# Patient Record
Sex: Male | Born: 1940 | Race: White | Hispanic: No | Marital: Married | Smoking: Never smoker
Health system: Southern US, Community
[De-identification: ages and names within clinical notes are randomized; demographics above are authoritative.]

## PROBLEM LIST (undated history)

## (undated) DIAGNOSIS — K602 Anal fissure, unspecified: Secondary | ICD-10-CM

## (undated) DIAGNOSIS — E785 Hyperlipidemia, unspecified: Secondary | ICD-10-CM

## (undated) DIAGNOSIS — N390 Urinary tract infection, site not specified: Secondary | ICD-10-CM

## (undated) DIAGNOSIS — C61 Malignant neoplasm of prostate: Secondary | ICD-10-CM

## (undated) DIAGNOSIS — Z8719 Personal history of other diseases of the digestive system: Secondary | ICD-10-CM

## (undated) DIAGNOSIS — B9620 Unspecified Escherichia coli [E. coli] as the cause of diseases classified elsewhere: Secondary | ICD-10-CM

## (undated) DIAGNOSIS — N189 Chronic kidney disease, unspecified: Secondary | ICD-10-CM

## (undated) DIAGNOSIS — K5790 Diverticulosis of intestine, part unspecified, without perforation or abscess without bleeding: Secondary | ICD-10-CM

## (undated) HISTORY — DX: Hyperlipidemia, unspecified: E78.5

## (undated) HISTORY — DX: Urinary tract infection, site not specified: N39.0

## (undated) HISTORY — PX: TONSILLECTOMY AND ADENOIDECTOMY: SUR1326

## (undated) HISTORY — PX: CATARACT EXTRACTION, BILATERAL: SHX1313

## (undated) HISTORY — DX: Diverticulosis of intestine, part unspecified, without perforation or abscess without bleeding: K57.90

## (undated) HISTORY — PX: PROSTATE BIOPSY: SHX241

## (undated) HISTORY — PX: COLONOSCOPY: SHX174

## (undated) HISTORY — DX: Unspecified Escherichia coli (E. coli) as the cause of diseases classified elsewhere: B96.20

## (undated) HISTORY — PX: APPENDECTOMY: SHX54

## (undated) HISTORY — DX: Anal fissure, unspecified: K60.2

## (undated) HISTORY — PX: WISDOM TOOTH EXTRACTION: SHX21

---

## 1961-02-07 HISTORY — PX: APPENDECTOMY: SHX54

## 2003-04-25 ENCOUNTER — Encounter: Payer: Self-pay | Admitting: Internal Medicine

## 2005-10-18 ENCOUNTER — Ambulatory Visit: Payer: Self-pay | Admitting: Internal Medicine

## 2005-10-20 ENCOUNTER — Ambulatory Visit: Payer: Self-pay | Admitting: Internal Medicine

## 2005-11-17 ENCOUNTER — Ambulatory Visit: Payer: Self-pay | Admitting: Gastroenterology

## 2005-11-22 ENCOUNTER — Ambulatory Visit: Payer: Self-pay | Admitting: Gastroenterology

## 2007-07-12 ENCOUNTER — Encounter: Payer: Self-pay | Admitting: Internal Medicine

## 2008-04-08 ENCOUNTER — Telehealth (INDEPENDENT_AMBULATORY_CARE_PROVIDER_SITE_OTHER): Payer: Self-pay | Admitting: *Deleted

## 2008-04-10 ENCOUNTER — Ambulatory Visit: Payer: Self-pay | Admitting: Internal Medicine

## 2008-04-10 ENCOUNTER — Encounter (INDEPENDENT_AMBULATORY_CARE_PROVIDER_SITE_OTHER): Payer: Self-pay | Admitting: *Deleted

## 2008-04-10 LAB — CONVERTED CEMR LAB
Basophils Absolute: 0 10*3/uL (ref 0.0–0.1)
Basophils Relative: 0 % (ref 0.0–3.0)
Calcium: 9.2 mg/dL (ref 8.4–10.5)
Cholesterol: 220 mg/dL (ref 0–200)
Creatinine, Ser: 1.3 mg/dL (ref 0.4–1.5)
Direct LDL: 164.8 mg/dL
Eosinophils Absolute: 0.3 10*3/uL (ref 0.0–0.7)
GFR calc non Af Amer: 58 mL/min
HDL: 33.7 mg/dL — ABNORMAL LOW (ref >39.0)
MCHC: 34.4 g/dL (ref 30.0–36.0)
MCV: 90.1 fL (ref 78.0–100.0)
Neutro Abs: 3.3 10*3/uL (ref 1.4–7.7)
Neutrophils Relative %: 55.9 % (ref 43.0–77.0)
PSA: 2.66 ng/mL (ref 0.10–4.00)
RBC: 5.35 M/uL (ref 4.22–5.81)
RDW: 13 % (ref 11.5–14.6)
Sodium: 139 meq/L (ref 135–145)
TSH: 1.6 microintl units/mL (ref 0.35–5.50)
Total Bilirubin: 1.1 mg/dL (ref 0.3–1.2)

## 2008-04-15 ENCOUNTER — Ambulatory Visit: Payer: Self-pay | Admitting: Internal Medicine

## 2008-04-15 DIAGNOSIS — E785 Hyperlipidemia, unspecified: Secondary | ICD-10-CM | POA: Insufficient documentation

## 2008-04-15 DIAGNOSIS — M545 Low back pain, unspecified: Secondary | ICD-10-CM | POA: Insufficient documentation

## 2008-04-15 DIAGNOSIS — D239 Other benign neoplasm of skin, unspecified: Secondary | ICD-10-CM | POA: Insufficient documentation

## 2008-05-19 ENCOUNTER — Encounter: Payer: Self-pay | Admitting: Internal Medicine

## 2008-06-25 ENCOUNTER — Ambulatory Visit: Payer: Self-pay | Admitting: Internal Medicine

## 2008-07-04 LAB — CONVERTED CEMR LAB
ALT: 23 units/L (ref 0–53)
AST: 22 units/L (ref 0–37)
Albumin: 4 g/dL (ref 3.5–5.2)
Alkaline Phosphatase: 42 units/L (ref 39–117)
Cholesterol: 172 mg/dL (ref 0–200)
Total Protein: 6.7 g/dL (ref 6.0–8.3)
Triglycerides: 97 mg/dL (ref 0.0–149.0)

## 2008-07-09 ENCOUNTER — Ambulatory Visit: Payer: Self-pay | Admitting: Internal Medicine

## 2008-10-09 ENCOUNTER — Ambulatory Visit: Payer: Self-pay | Admitting: Internal Medicine

## 2008-10-17 LAB — CONVERTED CEMR LAB
Bilirubin, Direct: 0.1 mg/dL (ref 0.0–0.3)
Cholesterol: 163 mg/dL (ref 0–200)
LDL Cholesterol: 104 mg/dL — ABNORMAL HIGH (ref 0–99)
Total Bilirubin: 1.3 mg/dL — ABNORMAL HIGH (ref 0.3–1.2)
Total Protein: 6.7 g/dL (ref 6.0–8.3)
VLDL: 22.2 mg/dL (ref 0.0–40.0)

## 2008-10-21 ENCOUNTER — Encounter (INDEPENDENT_AMBULATORY_CARE_PROVIDER_SITE_OTHER): Payer: Self-pay | Admitting: *Deleted

## 2008-12-26 ENCOUNTER — Ambulatory Visit: Payer: Self-pay | Admitting: Internal Medicine

## 2009-05-05 ENCOUNTER — Ambulatory Visit: Payer: Self-pay | Admitting: Internal Medicine

## 2009-05-05 DIAGNOSIS — L255 Unspecified contact dermatitis due to plants, except food: Secondary | ICD-10-CM | POA: Insufficient documentation

## 2009-05-05 DIAGNOSIS — R9431 Abnormal electrocardiogram [ECG] [EKG]: Secondary | ICD-10-CM | POA: Insufficient documentation

## 2009-05-11 ENCOUNTER — Ambulatory Visit: Payer: Self-pay | Admitting: Internal Medicine

## 2009-05-11 ENCOUNTER — Telehealth (INDEPENDENT_AMBULATORY_CARE_PROVIDER_SITE_OTHER): Payer: Self-pay | Admitting: *Deleted

## 2009-05-12 ENCOUNTER — Ambulatory Visit: Payer: Self-pay

## 2009-05-12 ENCOUNTER — Encounter (HOSPITAL_COMMUNITY): Admission: RE | Admit: 2009-05-12 | Discharge: 2009-07-08 | Payer: Self-pay | Admitting: Internal Medicine

## 2009-05-12 ENCOUNTER — Ambulatory Visit: Payer: Self-pay | Admitting: Cardiology

## 2009-05-14 LAB — CONVERTED CEMR LAB
Alkaline Phosphatase: 38 units/L — ABNORMAL LOW (ref 39–117)
BUN: 21 mg/dL (ref 6–23)
Basophils Absolute: 0 10*3/uL (ref 0.0–0.1)
Basophils Relative: 0.5 % (ref 0.0–3.0)
Bilirubin, Direct: 0.2 mg/dL (ref 0.0–0.3)
Chloride: 104 meq/L (ref 96–112)
Cholesterol: 173 mg/dL (ref 0–200)
Eosinophils Absolute: 0.4 10*3/uL (ref 0.0–0.7)
LDL Cholesterol: 103 mg/dL — ABNORMAL HIGH (ref 0–99)
Lymphocytes Relative: 25.3 % (ref 12.0–46.0)
MCHC: 34.6 g/dL (ref 30.0–36.0)
Neutrophils Relative %: 57.4 % (ref 43.0–77.0)
Potassium: 4.2 meq/L (ref 3.5–5.1)
RBC: 5.17 M/uL (ref 4.22–5.81)
RDW: 13.5 % (ref 11.5–14.6)
Total Bilirubin: 0.8 mg/dL (ref 0.3–1.2)
VLDL: 23.6 mg/dL (ref 0.0–40.0)

## 2010-03-09 NOTE — Assessment & Plan Note (Signed)
Summary: CPX/KDC   Vital Signs:  Patient profile:   70 year old male Height:      69.5 inches Weight:      180 pounds BMI:     26.29 Temp:     98.4 degrees F oral Resp:     14 per minute BP sitting:   100 / 60  (left arm) Cuff size:   large  Vitals Entered By: Miguel Joseph (May 05, 2009 1:11 PM)  Comments REVIEWED MED LIST, PATIENT AGREED DOSE AND INSTRUCTION CORRECT    History of Present Illness: Miguel Joseph is here for a physical; he is asymptomatic except for poison ivy  picked up yard cleaning & chronic LBP. Preventive healthcare measures up to date except for Shingles Vaccine.  Preventive Screening-Counseling & Management  Caffeine-Diet-Exercise     Does Patient Exercise: yes  Allergies (verified): No Known Drug Allergies  Past History:  Past Medical History: Hyperlipidemia: NMR : LDL 209(2406/1212), HDL 30, TG 154. LDL goal = < 120. LBP   Past Surgical History: Colonoscopy 2007: Internal Hemorrhoids, Dr  Claudette Head (due 2017) Appendectomy Tonsillectomy  Family History: Father: Testicular cancer Mother: breast & ovarian cancer Siblings: sister breast cancer; 2 P uncles pancreatic cancer ; P aunt pacer; P uncle MI pre 35 (smoker)  Social History: Never Smoked Alcohol use-yes:socially Retired Married Regular exercise-yes Does Patient Exercise:  yes  Review of Systems  The patient denies anorexia, fever, weight loss, weight gain, vision loss, decreased hearing, hoarseness, chest pain, syncope, dyspnea on exertion, peripheral edema, prolonged cough, headaches, hemoptysis, abdominal pain, melena, hematochezia, severe indigestion/heartburn, hematuria, incontinence, suspicious skin lesions, depression, unusual weight change, abnormal bleeding, enlarged lymph nodes, and angioedema.   MS:  Complains of low back pain; denies joint pain, joint redness, joint swelling, mid back pain, and thoracic pain. Neuro:  Denies brief paralysis, tingling, and weakness;  Occasional numbness in toes.  Physical Exam  General:  well-nourished; alert,appropriate and cooperative throughout examination Head:  Normocephalic and atraumatic without obvious abnormalities.  Eyes:  No corneal or conjunctival inflammation noted. Perrla. Funduscopic exam benign, without hemorrhages, exudates or papilledema.  Ears:  External ear exam shows no significant lesions or deformities.  Otoscopic examination reveals clear canals, tympanic membranes are intact bilaterally without bulging, retraction, inflammation or discharge. Hearing is grossly normal bilaterally. Nose:  External nasal examination shows no deformity or inflammation. Nasal mucosa are pink and moist without lesions or exudates. Mouth:  Oral mucosa and oropharynx without lesions or exudates.  Teeth in good repair. Neck:  No deformities, masses, or tenderness noted. Lungs:  Normal respiratory effort, chest expands symmetrically. Lungs are clear to auscultation, no crackles or wheezes. Heart:  Normal rate and regular rhythm. S1 and S2 normal without gallop, murmur, click, rub . S4 Abdomen:  Bowel sounds positive,abdomen soft and non-tender without masses, organomegaly or hernias noted. Rectal:  No external abnormalities noted. Normal sphincter tone. No rectal masses or tenderness. Genitalia:  Testes bilaterally descended without nodularity, tenderness or masses L testicle slightly atrophic. No scrotal masses or lesions. No penis lesions or urethral discharge. Prostate:  Prostate gland firm and smooth, no enlargement, nodularity, tenderness, mass, asymmetry or induration. Msk:  No deformity or scoliosis noted of thoracic or lumbar spine.   Pulses:  R and L carotid,radial,dorsalis pedis and posterior tibial pulses are full and equal bilaterally Extremities:  No clubbing, cyanosis, edema; normal full range of motion of all joints.   Minor DIP DJD finger changes Neurologic:  alert & oriented X3  and DTRs symmetrical and normal.    Skin:  Contact dermatitis over forearms Cervical Nodes:  No lymphadenopathy noted Axillary Nodes:  No palpable lymphadenopathy Inguinal Nodes:  No significant adenopathy Psych:  memory intact for recent and remote, normally interactive, and good eye contact.     Impression & Recommendations:  Problem # 1:  ROUTINE GENERAL MEDICAL EXAM@[email or web address]  CARE FACL (ICD-V70.0)  Orders: EKG w/ Interpretation (93000)  Problem # 2:  HYPERLIPIDEMIA (ICD-272.4)  Orders: Cardiolite (Cardiolite)  Problem # 3:  LOW BACK PAIN, MILD (ICD-724.2)  His updated medication list for this problem includes:    Bayer Aspirin 325 Mg Tabs (Aspirin) .Marland Kitchen... 1 by mouth once daily  Problem # 4:  NONSPECIFIC ABNORMAL ELECTROCARDIOGRAM (ICD-794.31)  Orders: Cardiolite (Cardiolite)  Problem # 5:  RHUS DERMATITIS (ICD-692.6) resolving with present meds  Complete Medication List: 1)  Bayer Aspirin 325 Mg Tabs (Aspirin) .Marland Kitchen.. 1 by mouth once daily 2)  Antioxidant Tabs (Multiple vitamins-minerals) .Marland Kitchen.. 1 by mouth once daily 3)  Centrum Silver Tabs (Multiple vitamins-minerals) .Marland Kitchen.. 1 by mouth once daily 4)  Pravastatin Sodium 40 Mg Tabs (pravastatin Sodium)  .Marland Kitchen.. 1 at bedtime  Patient Instructions: 1)  Schedule fasting labs @ [address]: 2)  BMP ; 3)  Hepatic Panel ; 4)  Lipid Panel ; 5)  TSH; 6)  CBC w/ Diff ; 7)  PSA. (Codes: V70.0, 272.4,794.31)

## 2010-03-09 NOTE — Assessment & Plan Note (Signed)
Summary: Cardiology Nuclear Study  Nuclear Med Background Indications for Stress Test: Evaluation for Ischemia, Abnormal EKG   History: Abnormal EKG      Nuclear Pre-Procedure Cardiac Risk Factors: Lipids Caffeine/Decaff Intake: none NPO After: 7:00 PM Lungs: clear IV 0.9% NS with Angio Cath: 20g     IV Site: (R) AC IV Started by: Stanton Kidney EMT-P Chest Size (in) 42     Height (in): 69.5 Weight (lb): 178 BMI: 26.00  Nuclear Med Study 1 or 2 day study:  1 day     Stress Test Type:  Stress Reading MD:  Olga Millers, MD     Referring MD:  W.Hopper Resting Radionuclide:  Technetium 40m Tetrofosmin     Resting Radionuclide Dose:  11.0 mCi  Stress Radionuclide:  Technetium 20m Tetrofosmin     Stress Radionuclide Dose:  33.0 mCi   Stress Protocol Exercise Time (min):  9:00 min     Max HR:  131 bpm     Predicted Max HR:  151 bpm  Max Systolic BP: 162 mm Hg     Percent Max HR:  86.75 %     METS: 10.10 Rate Pressure Product:  64403    Stress Test Technologist:  Milana Na EMT-P     Nuclear Technologist:  Domenic Polite CNMT  Rest Procedure  Myocardial perfusion imaging was performed at rest 45 minutes following the intravenous administration of Myoview Technetium 46m Tetrofosmin.  Stress Procedure  The patient exercised for 9:00. The patient stopped due to fatigue and denied any chest pain.  There were + T wave changes.  Myoview was injected at peak exercise and myocardial perfusion imaging was performed after a brief delay.  QPS Raw Data Images:  Acuisition technically good; normal left ventricular size. Stress Images:  There is normal uptake in all areas. Rest Images:  Normal homogeneous uptake in all areas of the myocardium. Subtraction (SDS):  No evidence of ischemia. Transient Ischemic Dilatation:  1.1  (Normal <1.22)  Lung/Heart Ratio:  .12  (Normal <0.45)  Quantitative Gated Spect Images QGS EDV:  95 ml QGS ESV:  43 ml QGS EF:  55 % QGS cine images:   Normal wall motion.   Overall Impression  Exercise Capacity: Good exercise capacity. BP Response: Normal blood pressure response. Clinical Symptoms: No chest pain ECG Impression: No significant ST segment change suggestive of ischemia. Overall Impression: There is no sign of scar or ischemia.  Appended Document: Cardiology Nuclear Study This is an excellent report. Despite the non specific EKG changes ,there is no evidence of ischemia (compromised coronary blood flow), rhythm irregularity or heart chamber dysfunction. Regular exercise recommended 30-45 min 3-4  X/week. [employer]

## 2010-03-09 NOTE — Progress Notes (Signed)
Summary: Nuclear Pre-Procedure  Phone Note Outgoing Call   Call placed by: Milana Na, EMT-P,  May 11, 2009 3:05 PM Summary of Call: Left message with information on Myoview Information Sheet (see scanned document for details).      Nuclear Med Background Indications for Stress Test: Evaluation for Ischemia, Abnormal EKG        Nuclear Pre-Procedure Cardiac Risk Factors: Lipids Height (in): 69.5  Nuclear Med Study Referring MD:  W.Hopper

## 2010-06-25 NOTE — Assessment & Plan Note (Signed)
[facility]                           GASTROENTEROLOGY OFFICE NOTE   Miguel Joseph, Miguel Joseph                      MRN:          [id number]  DATE:11/17/2005                            DOB:          01/06/1941    REFERRING PHYSICIAN:  Titus Joseph. Hopper, MD,FACP,FCCP   REASON FOR REFERRAL:  Rectal bleed and change in bowel habits.   HISTORY OF PRESENT ILLNESS:  Mr. Miguel Joseph is a very nice 70 year old white  male who I have seen in the past.  He underwent colonoscopy in March, 2001  for small volume hematochezia.  Internal hemorrhoids were noted and were  injected with hypertonic saline.  The remainder of the examination was  unremarkable.  He had a flexible sigmoidoscopy performed in 1997 for  colorectal cancer screening that was normal.  He relates problems over the  past few months with rectal pain, rectal irritation, tape-like stools, and  occasional small amounts of bleeding.  He feels there is a slight blockage  or narrowing of his colon that makes it difficult to evacuate stools.  He  has noted slightly looser stools on occasion and rectal burning with bowel  movements as well.  He is prescribed Proctofoam by Dr. Alwyn Joseph, which did  help improve his symptoms.  He relates no abdominal pain, weight loss,  nausea, vomiting, or melena.  There is no family history of colon cancer,  colon polyps, or inflammatory bowel disease, although his mother had breast  and uterine cancer, his sister had breast cancer.  His father has had  testicular cancer and apparently two paternal uncles have had pancreatic  cancer.   PAST MEDICAL HISTORY:  1. Hyperlipidemia.  2. Status post tonsillectomy.  3. Status post appendectomy.  4. Internal hemorrhoids.   CURRENT MEDICATIONS:  Listed on the chart, updated and reviewed.   MEDICATION ALLERGIES:  None known.   SOCIAL HISTORY:  Per the handwritten form.   REVIEW OF SYSTEMS:  Per the handwritten form.   PHYSICAL  EXAMINATION:  GENERAL:  No acute distress.  VITAL SIGNS:  Height 5 feet 9 inches.  Weight 182 pounds.  Blood pressure  110/76, pulse 80 and regular.  HEENT:  Anicteric sclerae.  Oropharynx clear.  CHEST:  Clear to auscultation bilaterally.  CARDIAC:  Regular rate and rhythm without murmur appreciated.  ABDOMEN:  Soft, nontender, nondistended.  Normoactive bowel sounds.  No  palpable organomegaly, masses, or hernias.  RECTAL:  Deferred to time of colonoscopy.  Recent exam by Dr. Alwyn Joseph showed  no lesions and hemoccult negative stool.  EXTREMITIES:  Without clubbing, cyanosis or edema.  NEUROLOGIC:  Alert and oriented x3.  Grossly nonfocal.   ASSESSMENT/PLAN:  Small volume hematochezia, rectal pain and burning, and a  change in stool caliber.  Rule out colorectal neoplasms.  I suspect this is  recurrent internal hemorrhoids.  Fissures, neoplasms, and proctitis need to  be further excluded.  Risks, benefits and alternatives to colonoscopy,  possible biopsy, possible polypectomy, and possible obstruction of internal  hemorrhoids were discussed with the patient, and he consents to proceed.  This will be scheduled electively.  In the interim, he is to begin a high-  fiber diet with daily fiber supplements.  Standard instructions for rectal  care and hemorrhoids supplied to the patient.  He may use an over-the-  counter suppository such as Anusol or Preparation-H nightly until his  colonoscopy has been completed.       Venita Lick. Miguel Dar, MD, [facility]      MTS/MedQ  DD:  11/17/2005  DT:  11/18/2005  Job #:  [id number]   cc:   Miguel Joseph. Miguel Ren, MD,FACP,FCCP

## 2010-06-25 NOTE — Assessment & Plan Note (Signed)
[facility]                          [place] OFFICE NOTE   [name], [name]                      MRN:          [id number]  DATE:10/18/2005                            DOB:          1940/04/12    Complaining of pain with bowel movements and loose stools.  He denies any  rectal bleeding.  The pain is described as a burning or tearing sensation  and as a bulging sensation near his rectum.   The symptoms began in July as a slight pain with bowel movements and  initially a tiny bit of blood.  He does have a history of fissure-in-ano.  He states he has had some pain with every bowel movement for 1 month.  On  September 10 he had multiple small stools, approximately 8:00 a.m. and  residual pain until 3:00 p.m.  Since that event, stools have been semi-  loose.   He had a colonoscopy in 2001, which was negative.   PAST MEDICAL HISTORY:  Includes tonsillectomy, appendectomy, and  dyslipidemia.   Mother had breast and ovarian cancer.  Father had testicular cancer.  Sister  had breast cancer.  Paternal uncle had pancreatic cancer.  Paternal aunt had  a pacer.  He has never smoked and he drinks socially.   He was previously on Zocor, but felt that this caused sleep dysfunction.  He  has not had followup because he admits focusing on his work rather than on  preventative health care.   He is in no acute distress.  His weight was actually up over 2 pounds to 185.9.  Blood pressure was  130/80, pulse 17, respiratory rate of 16.  An S4 was noted.  All pulses were intact.  PULMONARY:  Unremarkable.  He had no abdominal tumors and no organomegaly.  He had no lymphadenopathy.  Hemoccult testing was negative.  Prostate was normal to palpation.   His CBC and differential were normal.  Total cholesterol was 236 with  triglycerides of 206, HDL of 31 and LDL of 166.  I mailed copies of the labs  to him and requested that he see me to discuss these  risks and his options.  His glomerular filtration rate was 54 suggesting mild impairment; 40 ounces  of water daily would be recommended.  PSA and TSH were normal.  Urinalysis  was also normal.   GI consultation will be pursued as a prelude to repeat colonoscopy.                                   Titus Dubin. [name], MD,FACP,FCCP   [facility]/MedQ  DD:  10/30/2005  DT:  11/01/2005  Job #:  [id number]

## 2010-08-09 ENCOUNTER — Encounter: Payer: Self-pay | Admitting: Internal Medicine

## 2010-08-17 ENCOUNTER — Other Ambulatory Visit: Payer: Self-pay | Admitting: Internal Medicine

## 2010-08-18 NOTE — Telephone Encounter (Signed)
Lipid/Hep 272.4/995.20  

## 2010-09-15 ENCOUNTER — Other Ambulatory Visit: Payer: Self-pay | Admitting: Internal Medicine

## 2010-10-15 ENCOUNTER — Encounter: Payer: Self-pay | Admitting: Internal Medicine

## 2010-10-15 ENCOUNTER — Ambulatory Visit (INDEPENDENT_AMBULATORY_CARE_PROVIDER_SITE_OTHER): Payer: Medicare Other | Admitting: Internal Medicine

## 2010-10-15 VITALS — BP 112/70 | HR 73 | Temp 98.4°F | Resp 14 | Wt 186.0 lb

## 2010-10-15 DIAGNOSIS — E785 Hyperlipidemia, unspecified: Secondary | ICD-10-CM

## 2010-10-15 DIAGNOSIS — M5417 Radiculopathy, lumbosacral region: Secondary | ICD-10-CM

## 2010-10-15 DIAGNOSIS — Z0000 Encounter for general adult medical examination without abnormal findings: Secondary | ICD-10-CM

## 2010-10-15 DIAGNOSIS — IMO0002 Reserved for concepts with insufficient information to code with codable children: Secondary | ICD-10-CM

## 2010-10-15 DIAGNOSIS — R9431 Abnormal electrocardiogram [ECG] [EKG]: Secondary | ICD-10-CM

## 2010-10-15 MED ORDER — PRAVASTATIN SODIUM 40 MG PO TABS: 40.0000 mg | ORAL_TABLET | Freq: Every day | ORAL | Status: DC

## 2010-10-15 MED ORDER — GABAPENTIN 100 MG PO CAPS: 100.0000 mg | ORAL_CAPSULE | ORAL | Status: DC

## 2010-10-15 NOTE — Progress Notes (Signed)
Subjective:    Patient ID: Miguel Joseph, male    DOB: 03-20-1940, 70 y.o.   MRN: [id number]  HPI Medicare Wellness Visit:  The following psychosocial & medical history were reviewed as required by Medicare.   Social history: caffeine: 1 cola / day , alcohol:  Wine 2 glasses / week ,  tobacco use : never  & exercise : walking 3.5 mpd daily.   Home & personal  safety / fall risk: no issues, activities of daily living: nolimitations , seatbelt use : yes , and smoke alarm employment : yes .  Power of Attorney/Living Will status : yes  Vision ( as recorded per Nurse) & Hearing  evaluation :  Last Ophth exam several months ago ; minimal vision  OS(chronic); decreased  Hearing to whisper @ 6 ft. Orientation :orientation X 3 , memory & recall :normal, spelling  testing: good,and mood & affect : normal . Depression / anxiety: denied Travel history : 2012 [place] , immunization status :Shingles & Pneumovax needed , transfusion history:  no, and preventive health surveillance ( colonoscopies, BMD , etc as per protocol/ [facility]): colonoscopy up to date, Dental care:  Seen every  . Chart reviewed &  Updated. Active issues reviewed & addressed.       Review of Systems he has had some chronic low back issues; remotely he saw Dr. Darrelyn Hillock, orthopedist.  Intermittently he is noted sharp pain in a sciatic radiation down the right lower extremity. He also has numbness in both great toes.    Objective:   Physical Exam Gen.: Healthy and well-nourished in appearance. Alert, appropriate and cooperative throughout exam. Head: Normocephalic without obvious abnormalities Eyes: No corneal or conjunctival inflammation noted. Pupils equal round reactive to light and accommodation. Fundal exam is benign without hemorrhages, exudate, papilledema. Extraocular motion intact. Vision severely decreased OS. Ears: External  ear exam reveals no significant lesions or deformities. Canals clear .TMs normal. Hearing is grossly  decreased  bilaterally. Nose: External nasal exam reveals no deformity or inflammation. Nasal mucosa are pink and moist. No lesions or exudates noted. Septum  :slight dislocation  Mouth: Oral mucosa and oropharynx reveal no lesions or exudates. Teeth in good repair. Neck: No deformities, masses, or tenderness noted. Range of motion &. Thyroid normal. Lungs: Normal respiratory effort; chest expands symmetrically. Lungs are clear to auscultation without rales, wheezes, or increased work of breathing. Heart: Normal rate and rhythm. Normal S1 and S2. No gallop, click, or rub. No  murmur. Abdomen: Bowel sounds normal; abdomen soft and nontender. No masses, organomegaly .Slight ventral  hernia noted. Genitalia/DRE:  There is slight asymmetry of the testicles; left is smaller than the right. Granuloma is noted in the left supratesticular  area. Prostate is normal without enlargement, induration, or nodularity.                                                                                 Musculoskeletal/extremities: Asymmetry  noted of  The lower  Thoracic/ lumbar spine.  R paraspinus musculature > L.No clubbing, cyanosis, edema, or  Significant deformity noted. Range of motion  : decreased SLR bilaterally .Tone & strength  normal.Joints : minor DJD in fingers. Nail  health  good. Vascular: Carotid, radial artery, dorsalis pedis and  posterior tibial pulses are full and equal. No bruits present. Neurologic: Alert and oriented x3. Deep tendon reflexes symmetrical and normal.          A Skin: Intact without suspicious lesions or rashes. Lymph: No cervical, axillary, or inguinal lymphadenopathy present. Psych: Mood and affect are normal. Normally interactive                                                                                        Assessment & Plan:  #1 Medicare Wellness Exam; criteria met ; data entered #2 Problem List reviewed ; Assessment/ Recommendations made  #3 lumbosacral  radiculopathy right lower extremity, intermittent. The asymmetry of the lower spine musculature suggests possible scoliosis. Plan: see Orders  Stretching exercises for the back would be recommended with gabapentin as needed for a sciatic pain. If symptoms persist or progress; imaging (MRI) would be indicated.    EKG is essentially normal. 2 PACs are noted. He has an incomplete right bundle branch block pattern. The previously noted minor nonspecific T wave changes have improved compared to 05/05/09.

## 2010-10-15 NOTE — Patient Instructions (Signed)
Preventive Health Care: Exercise at least 30-45 minutes a day,  3-4 days a week.  Eat a low-fat diet with lots of fruits and vegetables, up to 7-9 servings per day. Avoid obesity; your goal is waist measurement < 40 inches.Consume less than 40 grams of sugar per day from foods & drinks with High Fructose Corn Sugar as # 1,2,3 or # 4 on label. The best exercises for the low back include freestyle swimming, stretch aerobics, and yoga.  Please  schedule fasting Labs : BMET,Lipids, hepatic panel, CBC & dif, TSH (272.4, 995.20)

## 2010-10-18 ENCOUNTER — Other Ambulatory Visit: Payer: Self-pay | Admitting: Internal Medicine

## 2010-10-18 DIAGNOSIS — E785 Hyperlipidemia, unspecified: Secondary | ICD-10-CM

## 2010-10-18 DIAGNOSIS — T887XXA Unspecified adverse effect of drug or medicament, initial encounter: Secondary | ICD-10-CM

## 2010-10-19 ENCOUNTER — Other Ambulatory Visit (INDEPENDENT_AMBULATORY_CARE_PROVIDER_SITE_OTHER): Payer: Medicare Other

## 2010-10-19 DIAGNOSIS — E785 Hyperlipidemia, unspecified: Secondary | ICD-10-CM

## 2010-10-19 DIAGNOSIS — T887XXA Unspecified adverse effect of drug or medicament, initial encounter: Secondary | ICD-10-CM

## 2010-10-19 LAB — TSH: TSH: 1.52 u[IU]/mL (ref 0.35–5.50)

## 2010-10-19 LAB — CBC WITH DIFFERENTIAL/PLATELET
Basophils Absolute: 0 10*3/uL (ref 0.0–0.1)
Hemoglobin: 16.5 g/dL (ref 13.0–17.0)
Lymphocytes Relative: 26.5 % (ref 12.0–46.0)
Monocytes Relative: 11 % (ref 3.0–12.0)
Platelets: 204 10*3/uL (ref 150.0–400.0)
RDW: 14 % (ref 11.5–14.6)
WBC: 5.7 10*3/uL (ref 4.5–10.5)

## 2010-10-19 LAB — BASIC METABOLIC PANEL
BUN: 26 mg/dL — ABNORMAL HIGH (ref 6–23)
CO2: 28 mEq/L (ref 19–32)
Calcium: 9.1 mg/dL (ref 8.4–10.5)
GFR: 54.46 mL/min — ABNORMAL LOW (ref >60.00)
Glucose, Bld: 96 mg/dL (ref 70–99)
Potassium: 4.4 mEq/L (ref 3.5–5.1)

## 2010-10-19 LAB — HEPATIC FUNCTION PANEL
AST: 27 U/L (ref 0–37)
Albumin: 4.3 g/dL (ref 3.5–5.2)
Alkaline Phosphatase: 33 U/L — ABNORMAL LOW (ref 39–117)
Total Protein: 7 g/dL (ref 6.0–8.3)

## 2010-10-19 LAB — LIPID PANEL
Total CHOL/HDL Ratio: 4
VLDL: 27.6 mg/dL (ref 0.0–40.0)

## 2010-10-19 NOTE — Progress Notes (Signed)
Labs only

## 2010-10-22 NOTE — Progress Notes (Signed)
Labs only

## 2011-05-30 ENCOUNTER — Encounter: Payer: Self-pay | Admitting: Family

## 2011-05-30 ENCOUNTER — Ambulatory Visit (INDEPENDENT_AMBULATORY_CARE_PROVIDER_SITE_OTHER): Payer: Medicare Other | Admitting: Family

## 2011-05-30 ENCOUNTER — Telehealth: Payer: Self-pay | Admitting: *Deleted

## 2011-05-30 VITALS — BP 108/68 | HR 75 | Temp 97.8°F | Resp 16 | Wt 181.0 lb

## 2011-05-30 DIAGNOSIS — H6690 Otitis media, unspecified, unspecified ear: Secondary | ICD-10-CM

## 2011-05-30 DIAGNOSIS — H6691 Otitis media, unspecified, right ear: Secondary | ICD-10-CM | POA: Insufficient documentation

## 2011-05-30 MED ORDER — CIPROFLOXACIN-HYDROCORTISONE 0.2-1 % OT SUSP: OTIC | Status: DC

## 2011-05-30 MED ORDER — AMOXICILLIN-POT CLAVULANATE 875-125 MG PO TABS: 1.0000 | ORAL_TABLET | Freq: Two times a day (BID) | ORAL | Status: AC

## 2011-05-30 NOTE — Telephone Encounter (Signed)
Received Rx error for cipro otic drops. Rx called to pharmacist per previous documentation.

## 2011-05-30 NOTE — Patient Instructions (Signed)
Call if symptoms worsen, or if no improvement in 2-3 days.  

## 2011-05-30 NOTE — Progress Notes (Signed)
  Subjective:    Patient ID: Miguel Joseph, male    DOB: 1940-04-05, 71 y.o.   MRN: [id number]  HPI  Mr. Brill is a 71 yr old male who presents today with chief complaint of right ear fullness.  Symptoms started about 1 week ago.  Describes pressure- feels like I am in a swimming pool.  He reports that the ear "won't pop." Tried an ear wax removal kit without improvement.  He denies associated fever, nasal congestion.    Review of Systems See HPI  No past medical history on file.  History   Social History  . Marital Status: Single    Spouse Name: N/A    Number of Children: N/A  . Years of Education: N/A   Occupational History  . Not on file.   Social History Main Topics  . Smoking status: Never Smoker   . Smokeless tobacco: Not on file  . Alcohol Use: 1.2 oz/week    2 Glasses of wine per week  . Drug Use: No  . Sexually Active: Not on file   Other Topics Concern  . Not on file   Social History Narrative  . No narrative on file    Past Surgical History  Procedure Date  . Colonoscopy 2001 & 2007    internal hemorrhoids; Dr Russella Dar  . Tonsillectomy and adenoidectomy   . Appendectomy     Family History  Problem Relation Age of Onset  . Cancer Mother     breast & ovarian   . Cancer Father     testicular  . Cancer Sister     breast  . Cancer Paternal Uncle      X2 ,pancreatic  . Heart block Paternal Aunt     pacer    No Known Allergies  Current Outpatient Prescriptions on File Prior to Visit  Medication Sig Dispense Refill  . pravastatin (PRAVACHOL) 40 MG tablet Take 1 tablet (40 mg total) by mouth daily.  90 tablet  3  . gabapentin (NEURONTIN) 100 MG capsule Take 1 capsule (100 mg total) by mouth as directed. 1 every 8 hrs prn for sciatica  30 capsule  5    BP 108/68  Pulse 75  Temp(Src) 97.8 F (36.6 C) (Oral)  Resp 16  Wt 181 lb (82.101 kg)  SpO2 95%       Objective:   Physical Exam  Constitutional: He appears well-developed and  well-nourished. No distress.  HENT:  Head: Normocephalic and atraumatic.  Mouth/Throat: No posterior oropharyngeal edema or posterior oropharyngeal erythema.       R TM initially obscured by cerumen (cerumen successfully removed with irrigation).  Revealing erythema of TM at 1 oclock, erythema and swelling of right canal.    Cardiovascular: Normal rate and regular rhythm.   No murmur heard. Pulmonary/Chest: Effort normal and breath sounds normal. No respiratory distress. He has no wheezes. He has no rales. He exhibits no tenderness.          Assessment & Plan:

## 2011-05-30 NOTE — Assessment & Plan Note (Signed)
Pt with R otitis media and R otitis externa.  Will rx with Augmentin and cipro hc otic drops.

## 2011-10-20 ENCOUNTER — Other Ambulatory Visit: Payer: Self-pay | Admitting: Internal Medicine

## 2011-10-20 ENCOUNTER — Other Ambulatory Visit: Payer: Self-pay | Admitting: General Practice

## 2011-10-20 DIAGNOSIS — E785 Hyperlipidemia, unspecified: Secondary | ICD-10-CM

## 2011-10-20 MED ORDER — PRAVASTATIN SODIUM 40 MG PO TABS: 40.0000 mg | ORAL_TABLET | Freq: Every day | ORAL | Status: DC

## 2011-10-31 ENCOUNTER — Encounter: Payer: Self-pay | Admitting: Internal Medicine

## 2011-10-31 ENCOUNTER — Ambulatory Visit (INDEPENDENT_AMBULATORY_CARE_PROVIDER_SITE_OTHER): Payer: Medicare Other | Admitting: Internal Medicine

## 2011-10-31 VITALS — BP 108/72 | HR 68 | Temp 97.9°F | Resp 12 | Ht 69.0 in | Wt 181.6 lb

## 2011-10-31 DIAGNOSIS — E785 Hyperlipidemia, unspecified: Secondary | ICD-10-CM

## 2011-10-31 DIAGNOSIS — Z23 Encounter for immunization: Secondary | ICD-10-CM

## 2011-10-31 DIAGNOSIS — Z0000 Encounter for general adult medical examination without abnormal findings: Secondary | ICD-10-CM

## 2011-10-31 NOTE — Patient Instructions (Addendum)
Preventive Health Care: Exercise at least 30-45 minutes a day,  3-4 days a week.  Eat a low-fat diet with lots of fruits and vegetables, up to 7-9 servings per day. Consume less than 40 grams of sugar per day from foods & drinks with High Fructose Corn Sugar as # 1,2,3 or # 4 on label. The best exercises for the low back include freestyle swimming, stretch aerobics, and yoga.  Please  schedule fasting Labs : BMET,Lipids, hepatic panel,  TSH.PLEASE BRING THESE INSTRUCTIONS TO FOLLOW UP  LAB APPOINTMENT.This will guarantee correct labs are drawn, eliminating need for repeat blood sampling ( needle sticks ! ). Diagnoses /Codes: 272.4, 995.20 .   If you activate My Chart; the results can be released to you as soon as they populate from the lab. If you choose not to use this program; the labs have to be reviewed, copied & mailed causing a delay in getting the results to you.

## 2011-10-31 NOTE — Progress Notes (Signed)
Subjective:    Patient ID: Miguel Joseph, male    DOB: [birth year], 71 y.o.   MRN: [id number]  HPI Medicare Wellness Visit:  The following psychosocial & medical history were reviewed as required by Medicare.   Social history: caffeine:2 glasses of  Tea/ day  , alcohol:  < 3 ,  tobacco use : never  & exercise : yard work, elliptical 3 X/ week X 30 min  & walking.   Home & personal  safety / fall risk: no issues, activities of daily living: no limitations , seatbelt use : yes , and smoke alarm employment : yes .  Power of Attorney/Living Will status : in place  Vision ( as recorded per Nurse) & Hearing  evaluation :  Ophth exam 5/13; no hearing exam. Orientation :oriented X 3  , memory & recall :good,  math testing: good,and mood & affect : normal . Depression / anxiety:denied Travel history : [place] 4/ 2013 , immunization status : ? Shingles & pneu vaccines needed , transfusion history:  no, and preventive health surveillance ( colonoscopies, BMD , etc as per protocol/ SOC): up to date, Dental care:  Every 4 mos . Chart reviewed &  Updated. Active issues reviewed & addressed.  Dyslipidemia assessment: Prior Advanced Lipid Testing: NMR LDL goal = < 115.   Family history of premature CAD/ MI: no .  Nutrition: heart healthy .  Diabetes : no . HTN: no.       Review of Systems  Weight :  stable.  fatigue: no ; chest pain : no ;claudication: no; palpitations: no; abd pain/bowel changes: no ; myalgias:no;  syncope : no ; memory loss: no;skin changes: no.      Objective:   Physical Exam Gen.: Appears younger than stated age .Healthy and well-nourished in appearance. Alert, appropriate and cooperative throughout exam. Head: Normocephalic without obvious abnormalities Eyes: No corneal or conjunctival inflammation noted. Pupils equal round reactive to light and accommodation. Fundal exam is benign without hemorrhages, exudate, papilledema. Extraocular motion intact. Vision grossly decreased in  OS even with lens. Ears: External  ear exam reveals no significant lesions or deformities. Canals clear .TMs normal. Hearing is grossly decreased bilaterally. Nose: External nasal exam reveals no deformity or inflammation. Nasal mucosa are pink and moist. No lesions or exudates noted.  Mouth: Oral mucosa and oropharynx reveal no lesions or exudates. Teeth in good repair. Neck: No deformities, masses, or tenderness noted. Range of motion slightly decreased. Thyroid normal. Lungs: Normal respiratory effort; chest expands symmetrically. Lungs are clear to auscultation without rales, wheezes, or increased work of breathing. Heart: Normal rate and rhythm. Normal S1 and S2. No gallop, click, or rub. S4 w/o murmur. Abdomen: Bowel sounds normal; abdomen soft and nontender. No masses, organomegaly or hernias noted. Genitalia/ BJY:[id number] normal except for atrophic  left testicle.  Prostate is normal without enlargement, asymmetry, nodularity, or induration.  Musculoskeletal/extremities: There is some asymmetry of the posterior thoracolumbar musculature suggesting occult scoliosis.  No clubbing, cyanosis, edema, or deformity noted. Range of motion  normal .Tone & strength  normal.Joints normal. Nail health  good. Vascular: Carotid, radial artery, dorsalis pedis and  posterior tibial pulses are full and equal. No bruits present. Neurologic: Alert and oriented x3. Deep tendon reflexes symmetrical and normal.          Skin: Intact without suspicious lesions or rashes. Lymph: No cervical, axillary, or inguinal lymphadenopathy present. Psych: Mood and affect are normal. Normally interactive  Assessment & Plan:  #1 Medicare Wellness Exam; criteria met ; data entered #2 Problem List reviewed ; Assessment/ Recommendations made Plan: see Orders

## 2011-11-03 ENCOUNTER — Other Ambulatory Visit (INDEPENDENT_AMBULATORY_CARE_PROVIDER_SITE_OTHER): Payer: Medicare Other

## 2011-11-03 DIAGNOSIS — E785 Hyperlipidemia, unspecified: Secondary | ICD-10-CM

## 2011-11-03 DIAGNOSIS — T887XXA Unspecified adverse effect of drug or medicament, initial encounter: Secondary | ICD-10-CM

## 2011-11-03 LAB — HEPATIC FUNCTION PANEL
ALT: 29 U/L (ref 0–53)
AST: 25 U/L (ref 0–37)
Bilirubin, Direct: 0 mg/dL (ref 0.0–0.3)
Total Bilirubin: 0.9 mg/dL (ref 0.3–1.2)

## 2011-11-03 LAB — BASIC METABOLIC PANEL
BUN: 24 mg/dL — ABNORMAL HIGH (ref 6–23)
Calcium: 9.2 mg/dL (ref 8.4–10.5)
Creatinine, Ser: 1.4 mg/dL (ref 0.4–1.5)
GFR: 53.85 mL/min — ABNORMAL LOW (ref >60.00)
Glucose, Bld: 88 mg/dL (ref 70–99)

## 2011-11-03 LAB — LIPID PANEL: VLDL: 25.4 mg/dL (ref 0.0–40.0)

## 2012-09-12 ENCOUNTER — Other Ambulatory Visit: Payer: Self-pay

## 2012-10-19 ENCOUNTER — Telehealth: Payer: Self-pay | Admitting: Internal Medicine

## 2012-10-19 DIAGNOSIS — E785 Hyperlipidemia, unspecified: Secondary | ICD-10-CM

## 2012-10-19 NOTE — Telephone Encounter (Signed)
Patient called for a refill on his pravastatin (PRAVACHOL) 40 MG tablet. Patient also made an appointment on December 17 2012   Thanks  Pharmacy [facility] on [address]

## 2012-10-23 ENCOUNTER — Telehealth: Payer: Self-pay | Admitting: *Deleted

## 2012-10-23 NOTE — Telephone Encounter (Signed)
Rx was called in to [facility] for pravachol 40 mg.  #90 patient has an upcoming appointment with Dr. Alfonse Flavors in November 2014.  Ag cma

## 2012-10-29 MED ORDER — PRAVASTATIN SODIUM 40 MG PO TABS: 40.0000 mg | ORAL_TABLET | Freq: Every day | ORAL | Status: DC

## 2012-10-29 NOTE — Telephone Encounter (Signed)
Rx sent to the pharmacy by e-script.//AB/CMA 

## 2012-12-07 ENCOUNTER — Ambulatory Visit (INDEPENDENT_AMBULATORY_CARE_PROVIDER_SITE_OTHER): Payer: Medicare Other | Admitting: Internal Medicine

## 2012-12-07 ENCOUNTER — Encounter: Payer: Self-pay | Admitting: Internal Medicine

## 2012-12-07 VITALS — BP 131/70 | HR 89 | Temp 99.3°F | Wt 180.8 lb

## 2012-12-07 DIAGNOSIS — B9620 Unspecified Escherichia coli [E. coli] as the cause of diseases classified elsewhere: Secondary | ICD-10-CM

## 2012-12-07 DIAGNOSIS — N390 Urinary tract infection, site not specified: Secondary | ICD-10-CM

## 2012-12-07 DIAGNOSIS — R509 Fever, unspecified: Secondary | ICD-10-CM

## 2012-12-07 DIAGNOSIS — R350 Frequency of micturition: Secondary | ICD-10-CM

## 2012-12-07 DIAGNOSIS — R319 Hematuria, unspecified: Secondary | ICD-10-CM

## 2012-12-07 HISTORY — DX: Urinary tract infection, site not specified: N39.0

## 2012-12-07 HISTORY — DX: Unspecified Escherichia coli (E. coli) as the cause of diseases classified elsewhere: B96.20

## 2012-12-07 LAB — CBC WITH DIFFERENTIAL/PLATELET
Basophils Absolute: 0 10*3/uL (ref 0.0–0.1)
Eosinophils Relative: 0 % (ref 0.0–5.0)
HCT: 44.5 % (ref 39.0–52.0)
Lymphocytes Relative: 4.3 % — ABNORMAL LOW (ref 12.0–46.0)
Lymphs Abs: 0.8 10*3/uL (ref 0.7–4.0)
Monocytes Relative: 6.5 % (ref 3.0–12.0)
Neutrophils Relative %: 89.2 % — ABNORMAL HIGH (ref 43.0–77.0)
Platelets: 172 10*3/uL (ref 150.0–400.0)
RDW: 14.2 % (ref 11.5–14.6)
WBC: 18.8 10*3/uL (ref 4.5–10.5)

## 2012-12-07 LAB — POCT URINALYSIS DIPSTICK
Bilirubin, UA: NEGATIVE
Glucose, UA: NEGATIVE
Ketones, UA: NEGATIVE
Nitrite, UA: POSITIVE
pH, UA: 6.5

## 2012-12-07 MED ORDER — CIPROFLOXACIN HCL 500 MG PO TABS: 500.0000 mg | ORAL_TABLET | Freq: Two times a day (BID) | ORAL | Status: DC

## 2012-12-07 NOTE — Patient Instructions (Signed)
Your next office appointment will be determined based upon review of your pending labs & response to medication. Those instructions will be transmitted to you through My Chart . Please report any significant change in your symptoms. Drink as much nondairy fluids as possible. Avoid spicy foods or alcohol as  these may aggravate the bladder. Do not take decongestants. Avoid narcotics if possible. NSAIDS ( Aleve, Advil, Naproxen) or Tylenol every 4 hrs as needed for fever as discussed based on label recommendations

## 2012-12-07 NOTE — Progress Notes (Signed)
Subjective:    Patient ID: Miguel Joseph, male    DOB: [birth year], 72 y.o.   MRN: [id number]  HPI  Wednesday evening he experienced a temperature elevation to 102. This was associated with nausea, rigor, and chills. He has continued to have fevers 100-102 intermittently since.  He's also had frequency of urine with urination lasting 10 seconds intermittently. He feels the urine may be malodorous.  He questions some tenderness in the testes  He has marked malaise. Also noted orthopnea when supine. He denies frank paroxysmal nocturnal dyspnea. He also does not have specific exertional dyspnea.  Past history includes appendectomy    Review of Systems He's had no swelling of the ankles and no calf tenderness.  He has had no chest pain, cough, sputum, or hemoptysis.  He had a flu shot approximately 10 AM 10/29 and local pharmacy. There is no rash at the site of injection or elsewhere on his body.  He is not had dysuria, pyuria, or hematuria.   He has had no tick exposure  He has no symptoms of rhinosinusitis such as frontal headache, facial pain, nasal purulence, or sore throat.  He's also had no diarrhea.       Objective:   Physical Exam Gen.: Healthy and well-nourished in appearance. Alert, appropriate and cooperative throughout exam.Appears younger than stated age  Head: Normocephalic without obvious abnormalities  Eyes: No corneal or conjunctival inflammation noted. Pupils equal round reactive to light and accommodation. No icterus Ears: External  ear exam reveals no significant lesions or deformities. Canals clear .TMs normal. Hearing is grossly normal bilaterally. Nose: External nasal exam reveals no deformity or inflammation. Nasal mucosa are pink and moist. No lesions or exudates noted. Septum to R  Mouth: Oral mucosa and oropharynx reveal no lesions or exudates. Teeth in good repair. Neck: No deformities, masses, or tenderness noted. Range of motion normal. Lungs:  Normal respiratory effort; chest expands symmetrically. Lungs are clear to auscultation without rales, wheezes, or increased work of breathing. Heart: Normal rate and rhythm. Normal S1 and S2. No gallop, click, or rub.S4 w/o murmur. Abdomen: Bowel sounds normal; abdomen soft and nontender. No masses, organomegaly or hernias noted. Genitalia: Granulomas epididymal area bilaterally with tenderness to palpation. Also left varices. Prostate is normal without enlargement, asymmetry, nodularity, or induration.  No adnexal tenderness                                 Musculoskeletal/extremities: No deformity or scoliosis noted of  the thoracic or lumbar spine.  No clubbing, cyanosis, edema, or significant extremity  deformity noted. Range of motion normal .Tone & strength  Normal. Joints with minor DJD DIP changes. Nail health good. Able to lie down & sit up w/o help. Negative SLR bilaterally Vascular: Carotid, radial artery, dorsalis pedis and  posterior tibial pulses are full and equal. No bruits present. Homan's negative Neurologic: Alert and oriented x3. Deep tendon reflexes symmetrical and normal.  Gait normal  including heel & toe walking .        Skin: Intact without suspicious lesions or rashes. Lymph: No cervical, axillary, or inguinal lymphadenopathy present. Psych: Mood and affect are normal. Normally interactive  Assessment & Plan:  #1 fever, probable urinary tract infection based on nitrites and large leukocytes on urinalysis. White blood also present. He also has tenderness in the epididymal areas but epididymitis is less suspect. No evidence of prostatitis  Plan: see orders

## 2012-12-10 ENCOUNTER — Encounter: Payer: Self-pay | Admitting: *Deleted

## 2012-12-10 LAB — URINE CULTURE: Colony Count: >=100000

## 2012-12-10 NOTE — Progress Notes (Signed)
Letter mailed to patient.

## 2012-12-13 ENCOUNTER — Other Ambulatory Visit: Payer: Self-pay

## 2012-12-14 ENCOUNTER — Telehealth: Payer: Self-pay

## 2012-12-14 NOTE — Telephone Encounter (Addendum)
Left message for call back  identifiable  Medication List and allergies: reviewed and updated  90 day supply/mail order: na Local prescriptions: [facility] [address]  Immunizations due: pneumonia, shingles, tdap  A/P:   No changes to FH or PSH CCS--2007--next 2017 No PSA on file unless in [facility]  To Discuss with Provider: Hearing loss/referral Feels maybe getting cataracts/sees Dr Thom Chimes toes are both numb Short term memory loss

## 2012-12-17 ENCOUNTER — Ambulatory Visit (INDEPENDENT_AMBULATORY_CARE_PROVIDER_SITE_OTHER): Payer: Medicare Other | Admitting: Internal Medicine

## 2012-12-17 ENCOUNTER — Encounter: Payer: Self-pay | Admitting: Internal Medicine

## 2012-12-17 VITALS — BP 100/57 | HR 75 | Temp 98.0°F | Ht 68.5 in | Wt 176.0 lb

## 2012-12-17 DIAGNOSIS — B9620 Unspecified Escherichia coli [E. coli] as the cause of diseases classified elsewhere: Secondary | ICD-10-CM

## 2012-12-17 DIAGNOSIS — Z0000 Encounter for general adult medical examination without abnormal findings: Secondary | ICD-10-CM

## 2012-12-17 DIAGNOSIS — E785 Hyperlipidemia, unspecified: Secondary | ICD-10-CM

## 2012-12-17 DIAGNOSIS — Z1331 Encounter for screening for depression: Secondary | ICD-10-CM

## 2012-12-17 DIAGNOSIS — A498 Other bacterial infections of unspecified site: Secondary | ICD-10-CM

## 2012-12-17 DIAGNOSIS — N390 Urinary tract infection, site not specified: Secondary | ICD-10-CM

## 2012-12-17 LAB — POCT URINALYSIS DIPSTICK
Leukocytes, UA: NEGATIVE
Nitrite, UA: NEGATIVE
Protein, UA: NEGATIVE
Spec Grav, UA: 1.015
pH, UA: 6

## 2012-12-17 MED ORDER — ASPIRIN EC 81 MG PO TBEC: 81.0000 mg | DELAYED_RELEASE_TABLET | Freq: Every day | ORAL | Status: DC

## 2012-12-17 NOTE — Patient Instructions (Signed)
Your next office appointment will be determined based upon review of your pending labs & cultures. Those instructions will be transmitted to you through My Chart . Please report any significant change in your symptoms.

## 2012-12-17 NOTE — Progress Notes (Signed)
Pre visit review using our clinic review tool, if applicable. No additional management support is needed unless otherwise documented below in the visit note. 

## 2012-12-17 NOTE — Progress Notes (Signed)
Subjective:    Patient ID: Miguel Joseph, male    DOB: December 13, 1940, 72 y.o.   MRN: [id number]  HPI   Medicare Wellness Visit: Psychosocial and medical history were reviewed as required by Medicare (history related to abuse, antisocial behavior , firearm risk). Social history: Caffeine:2 glasses tea/day  , Alcohol: 2 drinks / week , Tobacco XBM:WUXLK Exercise: see below Personal safety/fall risk: no Limitations of activities of daily living:no Seatbelt/ smoke alarm use:yes Healthcare Power of Attorney/Living Will status: in place Ophthalmologic exam status:current Hearing evaluation status:not current Orientation: Oriented X3 Memory and recall: good Math testing: good Depression/anxiety assessment: denied Foreign travel history:2014 [place] Immunization status for influenza/pneumonia/ shingles /tetanus:PNA & shingles needed Transfusion history:no Preventive health care maintenance status: Colonoscopy as per protocol/standard care:current Dental care:every 6 mos Chart reviewed and updated. Active issues reviewed and addressed as documented below.    Review of Systems  He has completed the full course of ciprofloxacin. He denies fever, chills, sweats, dysuria, pyuria, or hematuria. He was unaware that he had microscopic hematuria. A heart healthy diet is followed; exercise encompasses > 60 minutes 3-5  times per week as walking 3 mpd without symptoms.  Family history is negative for premature coronary disease. Advanced cholesterol testing reveals  LDL goal is less than 115 ; ideally < 85 . There is medication compliance with the statin.  Full dose ASA taken Specifically denied are  chest pain, palpitations, dyspnea, or claudication.  Significant abdominal symptoms, memory deficit, or myalgias not present.     Objective:   Physical Exam Gen.: Healthy and well-nourished in appearance. Alert, appropriate and cooperative throughout exam.Appears younger than stated age  Head:  Normocephalic without obvious abnormalities  Eyes: No corneal or conjunctival inflammation noted. Pupils equal round reactive to light and accommodation. Extraocular motion intact. Fundal exam is benign without hemorrhages, exudate, papilledema.   Ears: External  ear exam reveals no significant lesions or deformities. Canals clear .TMs normal. Hearing is grossly decreased bilaterally. Nose: External nasal exam reveals no deformity or inflammation. Nasal mucosa are pink and moist. No lesions or exudates noted.   Mouth: Oral mucosa and oropharynx reveal no lesions or exudates. Teeth in good repair. Neck: No deformities, masses, or tenderness noted. Range of motion & Thyroid normal. Lungs: Normal respiratory effort; chest expands symmetrically. Lungs are clear to auscultation without rales, wheezes, or increased work of breathing. Heart: Normal rate and rhythm. Normal S1 and S2. No gallop, click, or rub. S4 w/o murmur. Abdomen: Bowel sounds normal; abdomen soft and nontender. No masses, organomegaly or hernias noted. Genitalia: done 12/07/12                                  Musculoskeletal/extremities: No deformity or scoliosis noted of  the thoracic or lumbar spine.  No clubbing, cyanosis, edema, or significant extremity  deformity noted. Range of motion normal .Tone & strength normal. Hand joints reveal minor DIP DJD changes . Fingernail  health good. Able to lie down & sit up w/o help. Negative SLR bilaterally Vascular: Carotid, radial artery, dorsalis pedis and  posterior tibial pulses are full and equal. No bruits present. Neurologic: Alert and oriented x3. Deep tendon reflexes symmetrical and normal.      Skin: Intact without suspicious lesions or rashes. Lymph: No cervical, axillary lymphadenopathy present. Psych: Mood and affect are normal. Normally interactive  Assessment & Plan:  #1 Medicare  Wellness Exam; criteria met ; data entered #2 Problem List/Diagnoses reviewed Plan:  Assessments made/ Orders entered

## 2012-12-18 ENCOUNTER — Telehealth: Payer: Self-pay | Admitting: *Deleted

## 2012-12-18 NOTE — Telephone Encounter (Signed)
Message copied by Verdie Shire on Tue Dec 18, 2012  3:26 PM ------      Message from: Pecola Lawless      Created: Mon Dec 10, 2012 12:40 PM       reculture  Urine 24 hrs after he finishes Cipro please ------

## 2012-12-18 NOTE — Telephone Encounter (Signed)
Pt was seen by Dr. [name] on 12-17-12 and a urine and cx was done.  Waiting on cx results.//AB/CMA

## 2012-12-19 LAB — URINE CULTURE: Organism ID, Bacteria: NO GROWTH

## 2012-12-25 ENCOUNTER — Encounter: Payer: Self-pay | Admitting: Internal Medicine

## 2012-12-25 NOTE — Telephone Encounter (Signed)
Called and spoke with patient to make an appt. He stated that he is feeling better and will call tomorrow if he needs to be seen. Pt declined appt for today.

## 2012-12-26 ENCOUNTER — Encounter: Payer: Self-pay | Admitting: Internal Medicine

## 2012-12-27 ENCOUNTER — Encounter: Payer: Self-pay | Admitting: Internal Medicine

## 2012-12-27 ENCOUNTER — Ambulatory Visit (INDEPENDENT_AMBULATORY_CARE_PROVIDER_SITE_OTHER): Payer: Medicare Other | Admitting: Internal Medicine

## 2012-12-27 VITALS — BP 120/72 | HR 81 | Temp 98.7°F | Ht 68.5 in | Wt 176.6 lb

## 2012-12-27 DIAGNOSIS — R350 Frequency of micturition: Secondary | ICD-10-CM

## 2012-12-27 DIAGNOSIS — N390 Urinary tract infection, site not specified: Secondary | ICD-10-CM

## 2012-12-27 LAB — POCT URINALYSIS DIPSTICK
Urobilinogen, UA: 0.2
pH, UA: 6.5

## 2012-12-27 MED ORDER — SULFAMETHOXAZOLE-TRIMETHOPRIM 800-160 MG PO TABS: 1.0000 | ORAL_TABLET | Freq: Two times a day (BID) | ORAL | Status: DC

## 2012-12-27 NOTE — Progress Notes (Signed)
Pre visit review using our clinic review tool, if applicable. No additional management support is needed unless otherwise documented below in the visit note. 

## 2012-12-27 NOTE — Progress Notes (Signed)
  Subjective:    Patient ID: Miguel Joseph, male    DOB: Nov 19, 1940, 72 y.o.   MRN: [id number]  HPI symptoms began 5 days 6 days ago as tingling in the penis. He noted some irritation with urination. Associated was some flare of his hemorrhoids. The latter has improved.  He describes some terminal dysuria, i.e. pain as he completes voiding.  Last night he experienced low back pain which he attributed to sleeping in a bed away from home. He did note some bloating as well. He had nocturia 2-3 times.  He states he feels as if he's had a "kink" in the middle of his penis. This does not bother him when he ambulates but is worse with sitting.  Significantly he had over 100,000 Escherichia coli on urine culture 10/31. It was uniformly sensitive and was eradicated by a course of ciprofloxacin. Followup urine culture 12/17/12 revealed no growth. At the time of the urinary tract infection his white blood count of 18,800    Review of Systems  He denies fever, chills, sweats, hematuria or pyuria.     Objective:   Physical Exam General appearance is one of good health and nourishment w/o distress.  Eyes: No conjunctival inflammation .  Oral exam: Dental hygiene is good; lips and gums are healthy appearing.There is no oropharyngeal erythema or exudate noted.   Heart:  Normal rate and regular rhythm. S1 and S2 normal without gallop, murmur, click, rub or other extra sounds     Lungs:Chest clear to auscultation; no wheezes, rhonchi,rales ,or rubs present.No increased work of breathing.   Abdomen: bowel sounds normal, soft and non-tender without masses, organomegaly or hernias noted.  No guarding or rebound . No tenderness over the flanks to percussion  Musculoskeletal: Able to lie flat and sit up without help. Negative straight leg raising bilaterally. Gait normal  Skin:Warm & dry.  Intact without suspicious lesions or rashes ; no jaundice or tenting  Lymphatic: No lymphadenopathy is noted  about the head, neck, axilla.                Assessment & Plan:  #1UTI , recurrent See orders

## 2012-12-27 NOTE — Patient Instructions (Signed)
Stay well hydrated. Drink to thirst up to 40 ounces of fluids daily.Share urine culture results with the Urologist.

## 2012-12-29 LAB — URINE CULTURE: Colony Count: >=100000

## 2012-12-30 ENCOUNTER — Encounter: Payer: Self-pay | Admitting: Internal Medicine

## 2013-01-01 ENCOUNTER — Other Ambulatory Visit: Payer: Self-pay | Admitting: Internal Medicine

## 2013-01-01 DIAGNOSIS — N390 Urinary tract infection, site not specified: Secondary | ICD-10-CM

## 2013-01-07 ENCOUNTER — Other Ambulatory Visit: Payer: Medicare Other

## 2013-01-08 ENCOUNTER — Encounter: Payer: Self-pay | Admitting: Internal Medicine

## 2013-01-09 LAB — URINE CULTURE
Colony Count: NO GROWTH
Organism ID, Bacteria: NO GROWTH

## 2013-01-11 ENCOUNTER — Encounter: Payer: Self-pay | Admitting: Internal Medicine

## 2013-01-13 ENCOUNTER — Emergency Department (HOSPITAL_COMMUNITY): Payer: Medicare Other

## 2013-01-13 ENCOUNTER — Emergency Department (HOSPITAL_COMMUNITY)
Admission: EM | Admit: 2013-01-13 | Discharge: 2013-01-13 | Disposition: A | Discharge status: Home / Self Care | Payer: Medicare Other | Attending: Emergency Medicine | Admitting: Emergency Medicine

## 2013-01-13 ENCOUNTER — Encounter (HOSPITAL_COMMUNITY): Payer: Self-pay | Admitting: Emergency Medicine

## 2013-01-13 DIAGNOSIS — E785 Hyperlipidemia, unspecified: Secondary | ICD-10-CM | POA: Insufficient documentation

## 2013-01-13 DIAGNOSIS — R630 Anorexia: Secondary | ICD-10-CM | POA: Insufficient documentation

## 2013-01-13 DIAGNOSIS — N390 Urinary tract infection, site not specified: Secondary | ICD-10-CM | POA: Insufficient documentation

## 2013-01-13 DIAGNOSIS — Z7982 Long term (current) use of aspirin: Secondary | ICD-10-CM | POA: Insufficient documentation

## 2013-01-13 DIAGNOSIS — R6883 Chills (without fever): Secondary | ICD-10-CM | POA: Insufficient documentation

## 2013-01-13 DIAGNOSIS — Z9089 Acquired absence of other organs: Secondary | ICD-10-CM | POA: Insufficient documentation

## 2013-01-13 DIAGNOSIS — Z79899 Other long term (current) drug therapy: Secondary | ICD-10-CM | POA: Insufficient documentation

## 2013-01-13 DIAGNOSIS — R110 Nausea: Secondary | ICD-10-CM | POA: Insufficient documentation

## 2013-01-13 LAB — URINALYSIS, ROUTINE W REFLEX MICROSCOPIC
Bilirubin Urine: NEGATIVE
Ketones, ur: NEGATIVE mg/dL
Nitrite: NEGATIVE
Specific Gravity, Urine: 1.022 (ref 1.005–1.030)
Urobilinogen, UA: 0.2 mg/dL (ref 0.0–1.0)

## 2013-01-13 LAB — COMPREHENSIVE METABOLIC PANEL
ALT: 19 U/L (ref 0–53)
Calcium: 9.3 mg/dL (ref 8.4–10.5)
Creatinine, Ser: 1.29 mg/dL (ref 0.50–1.35)
GFR calc Af Amer: 62 mL/min — ABNORMAL LOW (ref >90)
Glucose, Bld: 124 mg/dL — ABNORMAL HIGH (ref 70–99)
Sodium: 131 mEq/L — ABNORMAL LOW (ref 135–145)
Total Bilirubin: 0.4 mg/dL (ref 0.3–1.2)
Total Protein: 7.2 g/dL (ref 6.0–8.3)

## 2013-01-13 LAB — CBC WITH DIFFERENTIAL/PLATELET
Basophils Absolute: 0 10*3/uL (ref 0.0–0.1)
Basophils Relative: 0 % (ref 0–1)
Eosinophils Absolute: 0 10*3/uL (ref 0.0–0.7)
Eosinophils Relative: 0 % (ref 0–5)
HCT: 42.2 % (ref 39.0–52.0)
Lymphocytes Relative: 7 % — ABNORMAL LOW (ref 12–46)
Lymphs Abs: 0.9 10*3/uL (ref 0.7–4.0)
MCH: 31.3 pg (ref 26.0–34.0)
MCHC: 34.8 g/dL (ref 30.0–36.0)
MCV: 89.8 fL (ref 78.0–100.0)
Monocytes Absolute: 1.1 10*3/uL — ABNORMAL HIGH (ref 0.1–1.0)
Monocytes Relative: 8 % (ref 3–12)
Platelets: 211 10*3/uL (ref 150–400)
RDW: 13.9 % (ref 11.5–15.5)
WBC: 13.8 10*3/uL — ABNORMAL HIGH (ref 4.0–10.5)

## 2013-01-13 LAB — URINE MICROSCOPIC-ADD ON

## 2013-01-13 MED ORDER — IOHEXOL 300 MG/ML  SOLN
100.0000 mL | Freq: Once | INTRAMUSCULAR | Status: AC | PRN
  Administered 2013-01-13: 100 mL via INTRAVENOUS

## 2013-01-13 MED ORDER — CIPROFLOXACIN HCL 500 MG PO TABS: 500.0000 mg | ORAL_TABLET | Freq: Two times a day (BID) | ORAL | Status: DC

## 2013-01-13 MED ORDER — SENNOSIDES-DOCUSATE SODIUM 8.6-50 MG PO TABS: 2.0000 | ORAL_TABLET | Freq: Every day | ORAL | Status: AC

## 2013-01-13 MED ORDER — IOHEXOL 300 MG/ML  SOLN
50.0000 mL | Freq: Once | INTRAMUSCULAR | Status: AC | PRN
  Administered 2013-01-13: 50 mL via ORAL

## 2013-01-13 MED ORDER — SODIUM CHLORIDE 0.9 % IV SOLN
1000.0000 mL | Freq: Once | INTRAVENOUS | Status: AC
  Administered 2013-01-13: 1000 mL via INTRAVENOUS

## 2013-01-13 MED ORDER — CIPROFLOXACIN HCL 500 MG PO TABS
500.0000 mg | ORAL_TABLET | Freq: Once | ORAL | Status: AC
  Administered 2013-01-13: 500 mg via ORAL
  Filled 2013-01-13: qty 1

## 2013-01-13 NOTE — ED Provider Notes (Signed)
CSN: [id number]     Arrival date & time 01/13/13  1902 History   First MD Initiated Contact with Patient 01/13/13 2027     Chief Complaint  Patient presents with  . Fever   (Consider location/radiation/quality/duration/timing/severity/associated sxs/prior Treatment) HPI Patient presents with concerns of fever, flank pain. Patient notes that this episode began earlier today, had a maximum temperature of 102. Notes that prior to this episode he was generally well.  Patient has been generally well, but states that over the past month he has had recurrent UTI.  He has seen primary care, urology.  He has completed cycles of 2 different antibiotics during this period. He has not had radiographic studies performed thus far This episode is characterized by the aforementioned pain, which is sore, nonradiating, mild chills, generalized discomfort, nausea, anorexia. No recent vomiting. Patient also describes new change in bowel habits w difficulty initiating bowel movements - and less frequency  Past Medical History  Diagnosis Date  . Hyperlipidemia   . E. coli UTI (urinary tract infection) 12/07/2012    Rx: Cipro   Past Surgical History  Procedure Laterality Date  . Colonoscopy  2001 & 2007    internal hemorrhoids; Dr Russella Dar  . Tonsillectomy and adenoidectomy    . Appendectomy    . Wisdom tooth extraction     Family History  Problem Relation Age of Onset  . Cancer Mother     breast & ovarian   . Cancer Father     testicular  . Breast cancer Sister   . Cancer Paternal Uncle      X2 ,pancreatic  . Heart block Paternal Aunt     pacer  . Diabetes Neg Hx   . Stroke Neg Hx   . Heart disease Neg Hx   . Hypertension Neg Hx    History  Substance Use Topics  . Smoking status: Never Smoker   . Smokeless tobacco: Not on file  . Alcohol Use: 1.2 oz/week    2 Glasses of wine per week    Review of Systems  Constitutional:       Per HPI, otherwise negative  HENT:       Per HPI,  otherwise negative  Respiratory:       Per HPI, otherwise negative  Cardiovascular:       Per HPI, otherwise negative  Gastrointestinal: Negative for vomiting.  Endocrine:       Negative aside from HPI  Genitourinary:       Neg aside from HPI   Musculoskeletal:       Per HPI, otherwise negative  Skin: Negative.   Neurological: Negative for syncope.    Allergies  Review of patient's allergies indicates no known allergies.  Home Medications   Current Outpatient Rx  Name  Route  Sig  Dispense  Refill  . aspirin 325 MG tablet   Oral   Take 325 mg by mouth daily.         Marland Kitchen aspirin EC 81 MG tablet   Oral   Take 81 mg by mouth daily.         . Multiple Vitamins-Minerals (ANTIOXIDANT FORMULA SG) capsule   Oral   Take 1 capsule by mouth daily.         . pravastatin (PRAVACHOL) 40 MG tablet   Oral   Take 40 mg by mouth daily.          BP 143/79  Pulse 109  Temp(Src) 100.6 F (38.1 C) (Oral)  Resp 19  Ht 5\' 8"  (1.727 m)  Wt 171 lb (77.565 kg)  BMI 26.01 kg/m2  SpO2 93% Physical Exam  Nursing note and vitals reviewed. Constitutional: He is oriented to person, place, and time. He appears well-developed. No distress.  HENT:  Head: Normocephalic and atraumatic.  Eyes: Conjunctivae and EOM are normal.  Cardiovascular: Normal rate and regular rhythm.   Pulmonary/Chest: Effort normal. No stridor. No respiratory distress.  Abdominal: Soft. Normal appearance. He exhibits no distension. There is tenderness in the left upper quadrant and left lower quadrant.  Musculoskeletal: He exhibits no edema.  Neurological: He is alert and oriented to person, place, and time.  Skin: Skin is warm and dry.  Psychiatric: He has a normal mood and affect.    ED Course  Procedures (including critical care time) Labs Review Labs Reviewed  URINALYSIS, ROUTINE W REFLEX MICROSCOPIC - Abnormal; Notable for the following:    APPearance CLOUDY (*)    Hgb urine dipstick TRACE (*)     Leukocytes, UA LARGE (*)    All other components within normal limits  CBC WITH DIFFERENTIAL - Abnormal; Notable for the following:    WBC 13.8 (*)    Neutrophils Relative % 85 (*)    Neutro Abs 11.7 (*)    Lymphocytes Relative 7 (*)    Monocytes Absolute 1.1 (*)    All other components within normal limits  COMPREHENSIVE METABOLIC PANEL - Abnormal; Notable for the following:    Sodium 131 (*)    Glucose, Bld 124 (*)    GFR calc non Af Amer 53 (*)    GFR calc Af Amer 62 (*)    All other components within normal limits  URINE CULTURE  URINE MICROSCOPIC-ADD ON  LIPASE, BLOOD   Imaging Review No results found.  EKG Interpretation   None      11:31 PM All results discussed with the patient and his wife.  I related the importance of followup with primary care, urology.  Patient has no new complaints, is hemodynamically stable.  MDM   1. UTI (lower urinary tract infection)    This patient presents with chills, and son have fever, mild abdominal pain.  With his change in bowel habits, there is some concern for GI process versus mass given his recurrent fever.  Patient's CT scan was largely reassuring, labs largely reassuring, though there is suggestion of a urinary tract infection.  Patient started on antibiotics, discharged in stable condition to follow up with primary care and urology    Gerhard Munch, MD 01/13/13 2332

## 2013-01-13 NOTE — ED Notes (Signed)
Pt c/o fever and bilat flank pain today. Chills and fever up to 102 today. Pt has been to PCP and Urology for recurrent UTI's since first of November. Pt also states he is now having pain associated with hemorrhoids. Pt reports change in BM consistency.

## 2013-01-15 LAB — URINE CULTURE
Colony Count: NO GROWTH
Culture: NO GROWTH

## 2013-09-17 ENCOUNTER — Encounter: Payer: Self-pay | Admitting: Internal Medicine

## 2013-09-24 ENCOUNTER — Encounter: Payer: Self-pay | Admitting: Internal Medicine

## 2013-09-24 ENCOUNTER — Ambulatory Visit (INDEPENDENT_AMBULATORY_CARE_PROVIDER_SITE_OTHER): Payer: Medicare Other | Admitting: Internal Medicine

## 2013-09-24 VITALS — BP 110/80 | HR 74 | Temp 98.3°F | Wt 182.0 lb

## 2013-09-24 DIAGNOSIS — M79644 Pain in right finger(s): Secondary | ICD-10-CM

## 2013-09-24 DIAGNOSIS — M79609 Pain in unspecified limb: Secondary | ICD-10-CM

## 2013-09-24 DIAGNOSIS — E785 Hyperlipidemia, unspecified: Secondary | ICD-10-CM

## 2013-09-24 NOTE — Progress Notes (Signed)
Pre visit review using our clinic review tool, if applicable. No additional management support is needed unless otherwise documented below in the visit note. 

## 2013-09-24 NOTE — Progress Notes (Signed)
   Subjective:    Patient ID: Miguel Joseph, male    DOB: 1940-11-08, 73 y.o.   MRN: [id number]  HPI Over the last 4 weeks he's had pain at the distal, central aspect of the right index finger. There was no specific trigger , injury or foreign body to his knowledge. At rest it was a level II; with direct pressure it was up to a level X in severity. It was also described as sharp with pressure. He had applied Neosporin.  Today it has essentially resolved, he describes it as a level I -II.  He has had some associated tingling.  At the time of his UTI in November 2014 his statin was held. He has not restarted it. He walks up to 3 miles every day weather permitting.  There is no family history of heart attack or stroke.    Review of Systems  He's had no associated change in color or temperature of the skin in this area. There's been no associated numbness.  He denies any constitutional symptoms of fever, chills, sweats, change in weight  There's been no weakness of the right upper extremity.  Intermittent hemorrhoidal flares; Prep H & cortisone employed prn.     Objective:   Physical Exam He appears healthy well-nourished in no distress  He has no lymphadenopathy about the neck, axilla, or epitrochlear areas.  The skin is normal in appearance with no rash or change in color or temperature.  Radial artery pulses are excellent.  Strength, tone, deep tendon reflexes in upper extremities are normal  There is mild asymmetric onycholysis of the right index fingernail. There does appear to be a central pinpoint hyperpigmentation at the medial aspect of the index finger nail.  Paronychia not present.  There is no tenderness to compression or palpation of the distal finger.        Assessment & Plan:  #1 index finger pain, resolved. The onycholysis and the focus of hyperpigmentation suggest foreign body as the trigger.  #2 dyslipidemia; off statin for 9 months.  Plan: See  orders and after visit summary.

## 2013-09-24 NOTE — Patient Instructions (Addendum)
Soak finger twice a day in saline. The saline can be purchased at the drugstore or you can make your own .Boil cup of salt in a gallon of water. Store mixture  in a clean container.Report Warning  signs as discussed (red streaks, pus, fever, increasing pain).   Cleanse rectal area with lather of mild shampoo in shower  as discussed. After bowel movement,use tissue to cleanse the bulk of stool & finish up with TUCKS  or Baby Wipes.  Sitz baths followed by Preparation H 2 to 3 times a day to shrink the hemorrhoids. Stay well hydrated and avoid popcorn and some other materials which might aggravate hemorrhoids.

## 2013-09-27 ENCOUNTER — Other Ambulatory Visit: Payer: Self-pay

## 2013-09-27 ENCOUNTER — Other Ambulatory Visit (INDEPENDENT_AMBULATORY_CARE_PROVIDER_SITE_OTHER): Payer: Medicare Other

## 2013-09-27 DIAGNOSIS — E785 Hyperlipidemia, unspecified: Secondary | ICD-10-CM

## 2013-09-27 LAB — LIPID PANEL
CHOLESTEROL: 224 mg/dL — AB (ref 0–200)
HDL: 39.1 mg/dL (ref >39.00)
LDL CALC: 156 mg/dL — AB (ref 0–99)
NONHDL: 184.9
Total CHOL/HDL Ratio: 6
Triglycerides: 144 mg/dL (ref 0.0–149.0)
VLDL: 28.8 mg/dL (ref 0.0–40.0)

## 2013-10-17 ENCOUNTER — Telehealth: Payer: Self-pay | Admitting: Internal Medicine

## 2013-10-17 NOTE — Telephone Encounter (Signed)
Let vm for pt to call back to schedule an appt for injection, one week apart per Dr.Hopper.

## 2013-10-17 NOTE — Telephone Encounter (Signed)
Pt request shingle injection, flu shot and tetanus shot. Please advise if this is okey.

## 2013-10-17 NOTE — Telephone Encounter (Signed)
All OK Recommend one a week

## 2013-10-21 ENCOUNTER — Ambulatory Visit (INDEPENDENT_AMBULATORY_CARE_PROVIDER_SITE_OTHER): Payer: Medicare Other | Admitting: *Deleted

## 2013-10-21 DIAGNOSIS — Z23 Encounter for immunization: Secondary | ICD-10-CM

## 2013-10-29 ENCOUNTER — Ambulatory Visit: Payer: Medicare Other

## 2013-12-02 ENCOUNTER — Encounter: Payer: Self-pay | Admitting: Internal Medicine

## 2013-12-19 ENCOUNTER — Other Ambulatory Visit: Payer: Self-pay | Admitting: Internal Medicine

## 2013-12-19 ENCOUNTER — Other Ambulatory Visit (INDEPENDENT_AMBULATORY_CARE_PROVIDER_SITE_OTHER): Payer: Medicare Other

## 2013-12-19 ENCOUNTER — Encounter: Payer: Self-pay | Admitting: Internal Medicine

## 2013-12-19 ENCOUNTER — Other Ambulatory Visit: Payer: Self-pay

## 2013-12-19 ENCOUNTER — Ambulatory Visit (INDEPENDENT_AMBULATORY_CARE_PROVIDER_SITE_OTHER): Payer: Medicare Other | Admitting: Internal Medicine

## 2013-12-19 VITALS — BP 118/80 | HR 76 | Temp 98.2°F | Resp 12 | Ht 68.25 in | Wt 180.4 lb

## 2013-12-19 DIAGNOSIS — K625 Hemorrhage of anus and rectum: Secondary | ICD-10-CM

## 2013-12-19 DIAGNOSIS — Z0000 Encounter for general adult medical examination without abnormal findings: Secondary | ICD-10-CM

## 2013-12-19 DIAGNOSIS — E785 Hyperlipidemia, unspecified: Secondary | ICD-10-CM

## 2013-12-19 LAB — CBC WITH DIFFERENTIAL/PLATELET
BASOS ABS: 0 10*3/uL (ref 0.0–0.1)
BASOS PCT: 0.3 % (ref 0.0–3.0)
EOS PCT: 3.2 % (ref 0.0–5.0)
Eosinophils Absolute: 0.2 10*3/uL (ref 0.0–0.7)
HEMATOCRIT: 49.5 % (ref 39.0–52.0)
HEMOGLOBIN: 16.8 g/dL (ref 13.0–17.0)
LYMPHS ABS: 1.3 10*3/uL (ref 0.7–4.0)
LYMPHS PCT: 23.4 % (ref 12.0–46.0)
MCHC: 33.9 g/dL (ref 30.0–36.0)
MCV: 91 fl (ref 78.0–100.0)
Monocytes Absolute: 0.6 10*3/uL (ref 0.1–1.0)
Monocytes Relative: 11.6 % (ref 3.0–12.0)
NEUTROS ABS: 3.4 10*3/uL (ref 1.4–7.7)
Neutrophils Relative %: 61.5 % (ref 43.0–77.0)
Platelets: 213 10*3/uL (ref 150.0–400.0)
RBC: 5.44 Mil/uL (ref 4.22–5.81)
RDW: 14 % (ref 11.5–15.5)
WBC: 5.5 10*3/uL (ref 4.0–10.5)

## 2013-12-19 LAB — HEPATIC FUNCTION PANEL
ALT: 34 U/L (ref 0–53)
AST: 28 U/L (ref 0–37)
Albumin: 3.8 g/dL (ref 3.5–5.2)
Alkaline Phosphatase: 36 U/L — ABNORMAL LOW (ref 39–117)
Bilirubin, Direct: 0.1 mg/dL (ref 0.0–0.3)
TOTAL PROTEIN: 7.4 g/dL (ref 6.0–8.3)
Total Bilirubin: 1.1 mg/dL (ref 0.2–1.2)

## 2013-12-19 LAB — BASIC METABOLIC PANEL
BUN: 21 mg/dL (ref 6–23)
CO2: 24 mEq/L (ref 19–32)
Calcium: 9.5 mg/dL (ref 8.4–10.5)
Chloride: 103 mEq/L (ref 96–112)
Creatinine, Ser: 1.3 mg/dL (ref 0.4–1.5)
GFR: 55.86 mL/min — ABNORMAL LOW (ref >60.00)
GLUCOSE: 102 mg/dL — AB (ref 70–99)
POTASSIUM: 4.4 meq/L (ref 3.5–5.1)
SODIUM: 137 meq/L (ref 135–145)

## 2013-12-19 LAB — TSH: TSH: 2.51 u[IU]/mL (ref 0.35–4.50)

## 2013-12-19 NOTE — Patient Instructions (Signed)
Your next office appointment will be determined based upon review of your pending labs . Those instructions will be transmitted to you through My Chart  Followup as needed for your acute issue. Please report any significant change in your symptoms. The GI referral will be scheduled and you'll be notified of the time.Please call the Referral Co-Ordinator @ [phone] if you have not been notified of appointment time within 7-10 days.

## 2013-12-19 NOTE — Assessment & Plan Note (Signed)
CBC & dif GI referral

## 2013-12-19 NOTE — Assessment & Plan Note (Signed)
Statin non compliance  BMET,Lipoprofile, LFTs, TSH

## 2013-12-19 NOTE — Progress Notes (Signed)
Subjective:    Patient ID: Miguel Joseph, male    DOB: 12-May-1940, 73 y.o.   MRN: [id number]  HPI  UHC/ Medicare Wellness Visit: Psychosocial and medical history were reviewed as required by Medicare (history related to abuse, antisocial behavior , firearm risk). Social history: Caffeine:essentially none  , Alcohol: 1-2 glasses wine/ week  , Tobacco OAC:ZYSAY Exercise:see below Personal safety/fall risk:no Limitations of activities of daily living:no Seatbelt/ smoke alarm use:yes Healthcare Power of Attorney/Living Will status: UTD; end of life issues reviewed Ophthalmologic exam status:UTD Hearing evaluation status:not UTD Orientation: Oriented X 3 Memory and recall: good Math testing: good Depression/anxiety assessment: no Foreign travel history:2/15 [place] Immunization status for influenza/pneumonia/ shingles /tetanus:only pneumonia due Transfusion history: no Preventive health care maintenance status: Colonoscopy as per protocol/standard care: UTD Dental care: every 3-4 mos Chart reviewed and updated. Active issues reviewed and addressed as documented below.  His pravastatin ran out this past Spring. He & his wife discussed this and opted not to continue it. He is on a modified heart healthy diet. He will ingest some red meats. Most days he walks 3.5 miles without any cardiopulmonary symptoms  There is no family history of premature heart attack or stroke  In 2005 advanced cholesterol testing revealed an LDL of 209 with approximately 24% increased risk based on particle number and distribution.      Review of Systems   Chest pain, palpitations, tachycardia, exertional dyspnea, paroxysmal nocturnal dyspnea, claudication or edema are absent.  He has had intermittent rectal bleeding for years; the last episode was in Spring for 1 week. He has intermittent "stinging " & questions a lesion @ rectum. Internal hemorrhoid @ colonoscopy 2007.  Unexplained weight loss,  abdominal pain, significant dyspepsia, dysphagia, melena, rectal bleeding, or persistently small caliber stools are denied.  He does have some numbness in his big toes; he denies any pain in this area.       Objective:   Physical Exam Gen.: Healthy and well-nourished in appearance. Alert, appropriate and cooperative throughout exam. Appears younger than stated age  Head: Normocephalic without obvious abnormalities;  pattern alopecia  Eyes: No corneal or conjunctival inflammation noted. Pupils equal round reactive to light and accommodation. Extraocular motion intact.  Ears: External  ear exam reveals no significant lesions or deformities. Canals clear .TMs normal. 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 Nose: External nasal exam reveals no deformity or inflammation. Nasal mucosa are pink and moist. No lesions or exudates noted.   Mouth: Oral mucosa and oropharynx reveal no lesions or exudates. Teeth in good repair. Neck: No deformities, masses, or tenderness noted. Range of motion decreased. Thyroid normal. Lungs: Normal respiratory effort; chest expands symmetrically. Lungs are clear to auscultation without rales, wheezes, or increased work of breathing. Heart: Normal rate and rhythm. Normal S1 and S2. No gallop, click, or rub. No murmur. Abdomen: Bowel sounds normal; abdomen soft and nontender. No masses, organomegaly or hernias noted. Genitalia: Genitalia normal except for left varices. Prostate is normal without enlargement, asymmetry, nodularity, or induration                                 Musculoskeletal/extremities: No deformity or scoliosis noted of  the thoracic or lumbar spine.  No clubbing, cyanosis, edema, or significant extremity  deformity noted. Range of motion normal .Tone & strength normal. Hand joints normal.  Fingernail health good. Able to lie down & sit up  w/o help. Negative SLR bilaterally Vascular: Carotid, radial artery, dorsalis pedis and  posterior  tibial pulses are full and equal. No bruits present. Neurologic: Alert and oriented x3. Deep tendon reflexes symmetrical and normal.  Gait normal.       Skin: Intact without suspicious lesions or rashes. Lymph: No cervical, axillary, or inguinal lymphadenopathy present. Psych: Mood and affect are normal. Normally interactive                                                                                        Assessment & Plan:  See Current Assessment & Plan in Problem List under specific DiagnosisThe labs will be reviewed and risks and options assessed. Written recommendations will be provided by mail or directly through My Chart.Further evaluation or change in medical therapy will be directed by those results.  Intermittent rectal bleeding & discomfort; GI referral made

## 2013-12-19 NOTE — Progress Notes (Signed)
Pre visit review using our clinic review tool, if applicable. No additional management support is needed unless otherwise documented below in the visit note. 

## 2013-12-20 ENCOUNTER — Telehealth: Payer: Self-pay | Admitting: *Deleted

## 2013-12-20 ENCOUNTER — Other Ambulatory Visit (INDEPENDENT_AMBULATORY_CARE_PROVIDER_SITE_OTHER): Payer: Self-pay

## 2013-12-20 DIAGNOSIS — R739 Hyperglycemia, unspecified: Secondary | ICD-10-CM

## 2013-12-20 DIAGNOSIS — R7309 Other abnormal glucose: Secondary | ICD-10-CM

## 2013-12-20 NOTE — Telephone Encounter (Signed)
-----   Message from Hendricks Limes, MD sent at 12/20/2013  7:58 AM EST ----- Please add A1c (R73.9)

## 2013-12-20 NOTE — Telephone Encounter (Signed)
Faxed add-on to the lab...Miguel Joseph

## 2013-12-21 LAB — NMR LIPOPROFILE WITH LIPIDS
Cholesterol, Total: 237 mg/dL — ABNORMAL HIGH (ref 100–199)
HDL PARTICLE NUMBER: 29.2 umol/L — AB (ref >=30.5)
HDL Size: 8 nm — ABNORMAL LOW (ref >=9.2)
HDL-C: 40 mg/dL (ref >39)
LARGE VLDL-P: 2.8 nmol/L — AB (ref <=2.7)
LDL (calc): 161 mg/dL — ABNORMAL HIGH (ref 0–99)
LDL Particle Number: 2194 nmol/L — ABNORMAL HIGH (ref <1000)
LDL SIZE: 20.8 nm (ref >=20.8)
LP-IR Score: 65 — ABNORMAL HIGH (ref <=45)
Small LDL Particle Number: 954 nmol/L — ABNORMAL HIGH (ref <=527)
Triglycerides: 179 mg/dL — ABNORMAL HIGH (ref 0–149)
VLDL Size: 43.5 nm (ref <=46.6)

## 2013-12-23 ENCOUNTER — Encounter: Payer: Self-pay | Admitting: Internal Medicine

## 2013-12-27 ENCOUNTER — Ambulatory Visit (INDEPENDENT_AMBULATORY_CARE_PROVIDER_SITE_OTHER): Payer: Medicare Other | Admitting: Internal Medicine

## 2013-12-27 ENCOUNTER — Encounter: Payer: Self-pay | Admitting: Internal Medicine

## 2013-12-27 VITALS — BP 94/60 | HR 67 | Temp 98.1°F | Wt 183.4 lb

## 2013-12-27 DIAGNOSIS — E782 Mixed hyperlipidemia: Secondary | ICD-10-CM

## 2013-12-27 DIAGNOSIS — R739 Hyperglycemia, unspecified: Secondary | ICD-10-CM

## 2013-12-27 MED ORDER — PRAVASTATIN SODIUM 40 MG PO TABS: 40.0000 mg | ORAL_TABLET | Freq: Every day | ORAL | Status: DC

## 2013-12-27 NOTE — Progress Notes (Signed)
   Subjective:    Patient ID: Miguel Joseph, male    DOB: 25-Jul-1940, 73 y.o.   MRN: [id number]  HPI   He his wife came and discussed the advanced cholesterol testing.  He had stopped his statin ; he intimated Gay Filler had misgivings about taking statins prophylactically.   Advanced testing done to optimally assess long risk to help decision making.  We spent >25 minutes discussing the pathophysiology of dyslipidemia & its causes (genetics &/or lifestyle).  We also reviewed the significance of insulin resistance and elevated triglycerides and nutritional interventions for such.  His risk was determined as being approximately 22%; his LDL goal is 100, ideally less than 70.  By history there is no definite family history of premature heart attack stroke.  He believes that he did have some myalgias on the lipophilic agent Lipitor in the past. He's had no issues with pravastatin.      Review of Systems     Objective:   Physical Exam        Assessment & Plan:  #1 dyslipidemia, genetic. This is most likely from hepatic production rather than gut absorption as he has benefited from pravastatin the past. He wishes to restart 40 mg of pravastatin  #2 elevated triglycerides. Nutritional interventions discussed.  Plan: See orders and after visit summary.

## 2013-12-27 NOTE — Patient Instructions (Signed)
Come in for  fasting labs after 10 weeks of  Cholesterol medication. No food except water for 8 hours before.

## 2013-12-27 NOTE — Progress Notes (Signed)
Pre visit review using our clinic review tool, if applicable. No additional management support is needed unless otherwise documented below in the visit note. 

## 2013-12-30 ENCOUNTER — Other Ambulatory Visit: Payer: Self-pay | Admitting: Internal Medicine

## 2014-02-17 ENCOUNTER — Telehealth: Payer: Self-pay | Admitting: Internal Medicine

## 2014-02-17 ENCOUNTER — Other Ambulatory Visit: Payer: Self-pay | Admitting: Internal Medicine

## 2014-02-17 DIAGNOSIS — K625 Hemorrhage of anus and rectum: Secondary | ICD-10-CM

## 2014-02-17 NOTE — Telephone Encounter (Signed)
Patient advised via voicemail that order has been placed and he should he from a scheduler

## 2014-02-17 NOTE — Telephone Encounter (Signed)
Pt called in seen Dr hopper on 11/12, he stated he was supposed to be referred to GI.  I didn't see a referral.  He is requesting a call back once the referral has been put in.

## 2014-02-17 NOTE — Telephone Encounter (Signed)
Order entered

## 2014-02-18 ENCOUNTER — Encounter: Payer: Self-pay | Admitting: Gastroenterology

## 2014-04-07 ENCOUNTER — Ambulatory Visit (INDEPENDENT_AMBULATORY_CARE_PROVIDER_SITE_OTHER): Payer: Medicare Other | Admitting: Gastroenterology

## 2014-04-07 ENCOUNTER — Encounter: Payer: Self-pay | Admitting: Gastroenterology

## 2014-04-07 VITALS — BP 120/70 | HR 68 | Ht 67.5 in | Wt 184.1 lb

## 2014-04-07 DIAGNOSIS — R195 Other fecal abnormalities: Secondary | ICD-10-CM

## 2014-04-07 DIAGNOSIS — K5900 Constipation, unspecified: Secondary | ICD-10-CM

## 2014-04-07 DIAGNOSIS — K921 Melena: Secondary | ICD-10-CM

## 2014-04-07 DIAGNOSIS — R194 Change in bowel habit: Secondary | ICD-10-CM

## 2014-04-07 MED ORDER — PEG-KCL-NACL-NASULF-NA ASC-C 100 G PO SOLR: 1.0000 | Freq: Once | ORAL | Status: DC

## 2014-04-07 NOTE — Progress Notes (Signed)
History of Present Illness: This is a 74 year old male with intermittent bright red blood per rectum, thinner stools, constipation and rectal pain. Prior colonoscopy in 2007 showed only internal hemorrhoids. Constipation and stool changes helped by increased fiber. Denies weight loss, abdominal pain, diarrhea, melena, nausea, vomiting, dysphagia, reflux symptoms, chest pain.  No Known Allergies Outpatient Prescriptions Prior to Visit  Medication Sig Dispense Refill  . aspirin EC 81 MG tablet Take 81 mg by mouth daily.    . Multiple Vitamins-Minerals (ANTIOXIDANT FORMULA SG) capsule Take 1 capsule by mouth daily.    . pravastatin (PRAVACHOL) 40 MG tablet Take one tablet by mouth one time daily 90 tablet 1   No facility-administered medications prior to visit.   Past Medical History  Diagnosis Date  . Hyperlipidemia   . E. coli UTI (urinary tract infection) 12/07/2012    Rx: Cipro   Past Surgical History  Procedure Laterality Date  . Colonoscopy  2001 & 2007    internal hemorrhoids; Dr Fuller Plan  . Tonsillectomy and adenoidectomy    . Appendectomy    . Wisdom tooth extraction     History   Social History  . Marital Status: Single    Spouse Name: N/A  . Number of Children: 3  . Years of Education: N/A   Occupational History  . retired Forensic psychologist    Social History Main Topics  . Smoking status: Never Smoker   . Smokeless tobacco: Never Used  . Alcohol Use: 1.2 oz/week    2 Glasses of wine per week  . Drug Use: No  . Sexual Activity: Not on file   Other Topics Concern  . None   Social History Narrative   Family History  Problem Relation Age of Onset  . Breast cancer Mother   . Testicular cancer Father   . Breast cancer Sister   . Pancreatic cancer Paternal Uncle      X2   . Heart block Paternal Aunt     pacer  . Diabetes Neg Hx   . Stroke Neg Hx   . Heart disease Neg Hx   . Hypertension Neg Hx   . Colon polyps Son 59    tubular adenoma  . Ovarian cancer  Mother      Review of Systems: Pertinent positive and negative review of systems were noted in the above HPI section. All other review of systems were otherwise negative.   Physical Exam: General: Well developed, well nourished, no acute distress Head: Normocephalic and atraumatic Eyes:  sclerae anicteric, EOMI Ears: Normal auditory acuity Mouth: No deformity or lesions Neck: Supple, no masses or thyromegaly Lungs: Clear throughout to auscultation Heart: Regular rate and rhythm; no murmurs, rubs or bruits Abdomen: Soft, non tender and non distended. No masses, hepatosplenomegaly or hernias noted. Normal Bowel sounds Rectal: No lesions, heme neg brown stool Musculoskeletal: Symmetrical with no gross deformities  Skin: No lesions on visible extremities Pulses:  Normal pulses noted Extremities: No clubbing, cyanosis, edema or deformities noted Neurological: Alert oriented x 4, grossly nonfocal Cervical Nodes:  No significant cervical adenopathy Inguinal Nodes: No significant inguinal adenopathy Psychological:  Alert and cooperative. Normal mood and affect  Assessment and Recommendations:  1. Hematochezia, change in stool caliber, rectal pain and constipation. Possible bleeding and pain from known internal hemorrhoids. Increase dietary fiber and daily water intake. Prep H supp daily as needed. Rule out colorectal neoplasms, hemorrhoids and other disorders. The risks (including bleeding, perforation, infection, missed lesions, medication reactions and possible  hospitalization or surgery if complications occur), benefits, and alternatives to colonoscopy with possible biopsy, possible injection of internal hemorrhoids and possible polypectomy were discussed with the patient and they consent to proceed.    cc: Hendricks Limes, MD [address] [place], [place] [address]

## 2014-04-07 NOTE — Patient Instructions (Signed)
You have been scheduled for a colonoscopy. Please follow written instructions given to you at your visit today.  Please pick up your prep supplies at the pharmacy within the next 1-3 days. If you use inhalers (even only as needed), please bring them with you on the day of your procedure. Your physician has requested that you go to www.startemmi.com and enter the access code given to you at your visit today. This web site gives a general overview about your procedure. However, you should still follow specific instructions given to you by our office regarding your preparation for the procedure.  Thank you for choosing me and [facility].  Malcolm T. Stark, Jr., MD., FACG  

## 2014-04-10 ENCOUNTER — Ambulatory Visit (AMBULATORY_SURGERY_CENTER): Payer: Medicare Other | Admitting: Gastroenterology

## 2014-04-10 ENCOUNTER — Encounter: Payer: Self-pay | Admitting: Gastroenterology

## 2014-04-10 VITALS — BP 112/76 | HR 62 | Temp 96.8°F | Resp 18 | Ht 67.5 in | Wt 184.0 lb

## 2014-04-10 DIAGNOSIS — K602 Anal fissure, unspecified: Secondary | ICD-10-CM

## 2014-04-10 DIAGNOSIS — R194 Change in bowel habit: Secondary | ICD-10-CM

## 2014-04-10 DIAGNOSIS — R195 Other fecal abnormalities: Secondary | ICD-10-CM

## 2014-04-10 DIAGNOSIS — K921 Melena: Secondary | ICD-10-CM

## 2014-04-10 DIAGNOSIS — K640 First degree hemorrhoids: Secondary | ICD-10-CM

## 2014-04-10 MED ORDER — SODIUM CHLORIDE 0.9 % IV SOLN: 500.0000 mL | INTRAVENOUS | Status: DC

## 2014-04-10 MED ORDER — DILTIAZEM GEL 2 %: 1.0000 "application " | Freq: Three times a day (TID) | CUTANEOUS | Status: DC

## 2014-04-10 NOTE — Op Note (Signed)
[place]  [address] [place], [id number]   COLONOSCOPY PROCEDURE REPORT  PATIENT: Miguel Joseph, Miguel Joseph  MR#: [id number] BIRTHDATE: 25-Jun-1940 , 73  yrs. old GENDER: male ENDOSCOPIST: Ladene Artist, MD, [facility] PROCEDURE DATE:  04/10/2014 PROCEDURE:   Colonoscopy, diagnostic First Screening Colonoscopy - Avg.  risk and is 50 yrs.  old or older - No.  Prior Negative Screening - Now for repeat screening. N/A  History of Adenoma - Now for follow-up colonoscopy & has been > or = to 3 yrs.  N/A  Polyps Removed Today? No.  Polyps Removed Today? No.  Recommend repeat exam, <10 yrs? Polyps Removed Today? No.  Recommend repeat exam, <10 yrs? No. ASA CLASS:   Class II INDICATIONS:hematochezia and change in bowel habits. MEDICATIONS: Monitored anesthesia care and Propofol 200 mg IV DESCRIPTION OF PROCEDURE:   After the risks benefits and alternatives of the procedure were thoroughly explained, informed consent was obtained.  The digital rectal exam revealed no abnormalities of the rectum.   The LB [id number]  endoscope was introduced through the anus and advanced to the cecum, which was identified by both the appendix and ileocecal valve. No adverse events experienced.   The quality of the prep was excellent, using MoviPrep  The instrument was then slowly withdrawn as the colon was fully examined.    COLON FINDINGS: There was mild diverticulosis noted in the sigmoid colon.   The examination was otherwise normal.  Retroflexed views revealed internal Grade I hemorrhoids and Retroflexed views revealed a posterior anal fissure. The time to cecum=1 minutes 59 seconds.  Withdrawal time=11 minutes 03 seconds.  The scope was withdrawn and the procedure completed. COMPLICATIONS: There were no immediate complications.  ENDOSCOPIC IMPRESSION: 1.   Mild diverticulosis was noted in the sigmoid colon 2.   Grade l internal hemorrhoids 3.   Posterior anal  fissure  RECOMMENDATIONS: 1.  High fiber diet with liberal fluid intake. 2.  Diltiazem 2% cream tid for 2 months and Prep H supp daily as needed 2.  Given your age, you will not need another colonoscopy for colon cancer screening or polyp surveillance.  These types of tests usually stop around the age 36. 3.  Call office for follow-up appointment for 6 weeks  eSigned:  Ladene Artist, MD, [facility] 04/10/2014 8:28 AM

## 2014-04-10 NOTE — Patient Instructions (Signed)
Discharge instructions given. Handouts on diverticulosis and hemorrhoids. Resume previous medications. YOU HAD AN ENDOSCOPIC PROCEDURE TODAY AT THE [facility]:   Refer to the procedure report that was given to you for any specific questions about what was found during the examination.  If the procedure report does not answer your questions, please call your gastroenterologist to clarify.  If you requested that your care partner not be given the details of your procedure findings, then the procedure report has been included in a sealed envelope for you to review at your convenience later.  YOU SHOULD EXPECT: Some feelings of bloating in the abdomen. Passage of more gas than usual.  Walking can help get rid of the air that was put into your GI tract during the procedure and reduce the bloating. If you had a lower endoscopy (such as a colonoscopy or flexible sigmoidoscopy) you may notice spotting of blood in your stool or on the toilet paper. If you underwent a bowel prep for your procedure, you may not have a normal bowel movement for a few days.  Please Note:  You might notice some irritation and congestion in your nose or some drainage.  This is from the oxygen used during your procedure.  There is no need for concern and it should clear up in a day or so.  SYMPTOMS TO REPORT IMMEDIATELY:   Following lower endoscopy (colonoscopy or flexible sigmoidoscopy):  Excessive amounts of blood in the stool  Significant tenderness or worsening of abdominal pains  Swelling of the abdomen that is new, acute  Fever of 100F or higher   For urgent or emergent issues, a gastroenterologist can be reached at any hour by calling [phone].   DIET: Your first meal following the procedure should be a small meal and then it is ok to progress to your normal diet. Heavy or fried foods are harder to digest and may make you feel nauseous or bloated.  Likewise, meals heavy in dairy and vegetables can  increase bloating.  Drink plenty of fluids but you should avoid alcoholic beverages for 24 hours.  ACTIVITY:  You should plan to take it easy for the rest of today and you should NOT DRIVE or use heavy machinery until tomorrow (because of the sedation medicines used during the test).    FOLLOW UP: Our staff will call the number listed on your records the next business day following your procedure to check on you and address any questions or concerns that you may have regarding the information given to you following your procedure. If we do not reach you, we will leave a message.  However, if you are feeling well and you are not experiencing any problems, there is no need to return our call.  We will assume that you have returned to your regular daily activities without incident.  If any biopsies were taken you will be contacted by phone or by letter within the next 1-3 weeks.  Please call us at [phone] if you have not heard about the biopsies in 3 weeks.    SIGNATURES/CONFIDENTIALITY: You and/or your care partner have signed paperwork which will be entered into your electronic medical record.  These signatures attest to the fact that that the information above on your After Visit Summary has been reviewed and is understood.  Full responsibility of the confidentiality of this discharge information lies with you and/or your care-partner. 

## 2014-04-10 NOTE — Progress Notes (Signed)
Report to PACU, RN, vss, BBS= Clear.  

## 2014-04-11 ENCOUNTER — Telehealth: Payer: Self-pay

## 2014-04-11 NOTE — Telephone Encounter (Signed)
  Follow up Call-  Call back number 04/10/2014  Post procedure Call Back phone  # [phone]  Permission to leave phone message Yes     Patient questions:  Do you have a fever, pain , or abdominal swelling? No. Pain Score  0 *  Have you tolerated food without any problems? Yes.    Have you been able to return to your normal activities? Yes.    Do you have any questions about your discharge instructions: Diet   No. Medications  No. Follow up visit  No.  Do you have questions or concerns about your Care? No.  Actions: * If pain score is 4 or above: No action needed, pain <4.

## 2014-04-30 ENCOUNTER — Other Ambulatory Visit (INDEPENDENT_AMBULATORY_CARE_PROVIDER_SITE_OTHER): Payer: Medicare Other

## 2014-04-30 ENCOUNTER — Encounter: Payer: Self-pay | Admitting: Internal Medicine

## 2014-04-30 DIAGNOSIS — E782 Mixed hyperlipidemia: Secondary | ICD-10-CM

## 2014-04-30 DIAGNOSIS — R739 Hyperglycemia, unspecified: Secondary | ICD-10-CM

## 2014-04-30 LAB — LIPID PANEL
CHOLESTEROL: 165 mg/dL (ref 0–200)
HDL: 41.6 mg/dL (ref >39.00)
LDL Cholesterol: 98 mg/dL (ref 0–99)
NONHDL: 123.4
Total CHOL/HDL Ratio: 4
Triglycerides: 126 mg/dL (ref 0.0–149.0)
VLDL: 25.2 mg/dL (ref 0.0–40.0)

## 2014-04-30 LAB — CK: Total CK: 124 U/L (ref 7–232)

## 2014-04-30 LAB — HEPATIC FUNCTION PANEL
ALBUMIN: 4.2 g/dL (ref 3.5–5.2)
ALT: 28 U/L (ref 0–53)
AST: 23 U/L (ref 0–37)
Alkaline Phosphatase: 37 U/L — ABNORMAL LOW (ref 39–117)
BILIRUBIN TOTAL: 0.7 mg/dL (ref 0.2–1.2)
Bilirubin, Direct: 0.1 mg/dL (ref 0.0–0.3)
Total Protein: 6.8 g/dL (ref 6.0–8.3)

## 2014-04-30 LAB — HEMOGLOBIN A1C: HEMOGLOBIN A1C: 6 % (ref 4.6–6.5)

## 2014-05-26 ENCOUNTER — Ambulatory Visit (INDEPENDENT_AMBULATORY_CARE_PROVIDER_SITE_OTHER): Payer: Medicare Other | Admitting: Gastroenterology

## 2014-05-26 ENCOUNTER — Encounter: Payer: Self-pay | Admitting: Gastroenterology

## 2014-05-26 VITALS — BP 100/60 | HR 72 | Ht 67.5 in | Wt 180.4 lb

## 2014-05-26 DIAGNOSIS — K602 Anal fissure, unspecified: Secondary | ICD-10-CM

## 2014-05-26 NOTE — Patient Instructions (Signed)
Finish your diltiazem gel three times a day for another 6 weeks.   Call our office back if your symptoms have not improved after the 6 weeks.   Thank you for choosing me and [facility].  Pricilla Riffle. Dagoberto Ligas., MD., Marval Regal

## 2014-05-26 NOTE — Progress Notes (Signed)
    History of Present Illness: This is a 74 year old male returning for follow-up for posterior anal fissure and internal hemorrhoids. He states his symptoms have improved although he still has intermittent mild posterior rectal pain. He is using diltiazem gel twice daily.  Current Medications, Allergies, Past Medical History, Past Surgical History, Family History and Social History were reviewed in [employer] record.  Physical Exam: General: Well developed , well nourished, no acute distress Head: Normocephalic and atraumatic Eyes:  sclerae anicteric, EOMI Ears: Normal auditory acuity Mouth: No deformity or lesions Lungs: Clear throughout to auscultation Heart: Regular rate and rhythm; no murmurs, rubs or bruits Abdomen: Soft, non tender and non distended. No masses, hepatosplenomegaly or hernias noted. Normal Bowel sounds Musculoskeletal: Symmetrical with no gross deformities  Pulses:  Normal pulses noted Extremities: No clubbing, cyanosis, edema or deformities noted Neurological: Alert oriented x 4, grossly nonfocal Psychological:  Alert and cooperative. Normal mood and affect  Assessment and Recommendations:  1. Posterior anal fissure. Diltiazem 2% gel tid for another 6 weeks. Surgical referral if symptoms not resolved in 6 weeks.   2. Internal hemorrhoids. Prep H suppositories daily as needed.  I spent 15 minutes of face-to-face time with patient. Over 50% of the time was spent counseling and coordinating care.

## 2014-07-08 ENCOUNTER — Encounter: Payer: Self-pay | Admitting: Internal Medicine

## 2014-07-08 ENCOUNTER — Encounter: Payer: Self-pay | Admitting: Gastroenterology

## 2014-07-08 MED ORDER — PRAVASTATIN SODIUM 40 MG PO TABS: 40.0000 mg | ORAL_TABLET | Freq: Every day | ORAL | Status: DC

## 2014-07-08 NOTE — Telephone Encounter (Signed)
Dr. Fuller Plan, see communication from patient.  He is requesting a refill of diltiazem.  Your last office note said 6 more weeks and that if his symptoms failed to resolve he would need to see a Psychologist, sport and exercise.  He wants to try one more month of diltiazem.  He does not want to see a Psychologist, sport and exercise.  Please advise if ok to refill diltiazem

## 2014-07-10 ENCOUNTER — Encounter: Payer: Self-pay | Admitting: Gastroenterology

## 2014-07-10 DIAGNOSIS — K921 Melena: Secondary | ICD-10-CM

## 2014-07-10 DIAGNOSIS — R195 Other fecal abnormalities: Secondary | ICD-10-CM

## 2014-07-10 MED ORDER — DILTIAZEM GEL 2 %: 1.0000 "application " | Freq: Three times a day (TID) | CUTANEOUS | Status: DC

## 2014-07-10 NOTE — Telephone Encounter (Signed)
Per Dr. Fuller Plan ok to refill x 1.  rx sent

## 2014-07-10 NOTE — Telephone Encounter (Signed)
Dr. Fuller Plan, see communication from patient. He is requesting a refill of diltiazem. Your last office note said 6 more weeks and that if his symptoms failed to resolve he would need to see a Psychologist, sport and exercise. He wants to try one more month of diltiazem. He does not want to see a Psychologist, sport and exercise. Please advise if ok to refill diltiazem

## 2014-07-11 ENCOUNTER — Encounter: Payer: Self-pay | Admitting: Gastroenterology

## 2014-08-11 ENCOUNTER — Encounter: Payer: Self-pay | Admitting: Internal Medicine

## 2014-08-12 NOTE — Telephone Encounter (Signed)
Please advise 

## 2014-09-07 ENCOUNTER — Encounter: Payer: Self-pay | Admitting: Internal Medicine

## 2014-09-08 ENCOUNTER — Ambulatory Visit (INDEPENDENT_AMBULATORY_CARE_PROVIDER_SITE_OTHER): Payer: Medicare Other | Admitting: Internal Medicine

## 2014-09-08 ENCOUNTER — Encounter: Payer: Self-pay | Admitting: Internal Medicine

## 2014-09-08 VITALS — BP 114/76 | HR 66 | Temp 98.2°F | Resp 16 | Wt 166.0 lb

## 2014-09-08 DIAGNOSIS — H6123 Impacted cerumen, bilateral: Secondary | ICD-10-CM | POA: Diagnosis not present

## 2014-09-08 DIAGNOSIS — R634 Abnormal weight loss: Secondary | ICD-10-CM

## 2014-09-08 DIAGNOSIS — H9312 Tinnitus, left ear: Secondary | ICD-10-CM

## 2014-09-08 NOTE — Progress Notes (Signed)
   Subjective:    Patient ID: Miguel Joseph, male    DOB: 1941-01-26, 74 y.o.   MRN: [id number]  HPI He has noted some pressure in the left ear with slight balance issues. He's also noted a swishing type sound in the left ear as well. 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  He does use Q-tips.  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  He has no associated upper respiratory tract infection symptoms.  He's lost 14-1/2 pounds simply by restricting potatoes. Weight loss is not associated with any GI symptoms.   Review of Systems Frontal headache, facial pain , nasal purulence, dental pain, sore throat , otic pain or otic discharge denied. No fever , chills or sweats.Unexplained weight loss, abdominal pain, significant dyspepsia, dysphagia, melena, rectal bleeding, or persistently small caliber stools are denied.    Objective:   Physical Exam  General appearance:Adequately nourished; no acute distress or increased work of breathing is present.    Lymphatic: No  lymphadenopathy about the head, neck, or axilla .  Eyes: No conjunctival inflammation or lid edema is present. There is no scleral icterus. Arcus suggested bilaterally.  Ears:  External ear exam shows no significant lesions or deformities.  Wax impactions bilaterally. Following soaking and gavage impactions were removed totally. There was slight bleeding in the right external canal. Hearing is decreased bilaterally, greater on the left.  Nose:  External nasal examination shows no deformity or inflammation. Nasal mucosa are pink and moist without lesions or exudates No septal dislocation or deviation.No obstruction to airflow.   Oral exam: Dental hygiene is good; lips and gums are healthy appearing.There is no oropharyngeal erythema or exudate .  Neck:  No deformities, thyromegaly, masses, or tenderness noted.   Supple with full range of motion  without pain.   Heart:  Normal rate and regular rhythm. S1 and S2 normal without gallop, murmur, click, rub or other extra sounds.   Lungs:Chest clear to auscultation; no wheezes, rhonchi,rales ,or rubs present.  Extremities:  No cyanosis, edema, or clubbing  noted    Skin: Warm & dry w/o tenting or jaundice. No significant lesions or rash.         Assessment & Plan:  #1 wax impactions with associated tinnitus  #2 hearing loss which is chronic and unrelated to #1  #3 weight loss, physiologic  See after visit summary

## 2014-09-08 NOTE — Progress Notes (Signed)
Pre visit review using our clinic review tool, if applicable. No additional management support is needed unless otherwise documented below in the visit note. 

## 2014-09-08 NOTE — Patient Instructions (Signed)
Please do not use Q-tips as this simply packs the wax down against he eardrum. Should wax build up occur, please put 2-3 drops of mineral oil in the ear at night and cover the canal with a  cotton ball.In the morning fill the canal with hydrogen peroxide & leave  for 10-15 minutes.Following this shower and use the thinnest washrag available to wick out the wax.

## 2014-12-17 ENCOUNTER — Encounter: Payer: Self-pay | Admitting: Internal Medicine

## 2014-12-23 ENCOUNTER — Ambulatory Visit (INDEPENDENT_AMBULATORY_CARE_PROVIDER_SITE_OTHER): Payer: Medicare Other | Admitting: Internal Medicine

## 2014-12-23 ENCOUNTER — Encounter: Payer: Self-pay | Admitting: Internal Medicine

## 2014-12-23 VITALS — BP 101/62 | HR 62 | Temp 97.7°F | Wt 162.0 lb

## 2014-12-23 DIAGNOSIS — K5730 Diverticulosis of large intestine without perforation or abscess without bleeding: Secondary | ICD-10-CM

## 2014-12-23 DIAGNOSIS — E785 Hyperlipidemia, unspecified: Secondary | ICD-10-CM

## 2014-12-23 NOTE — Patient Instructions (Signed)
  Your next office appointment will be determined based upon review of your pending labs  and  xrays  Those written interpretation of the lab results and instructions will be transmitted to you by My Chart   Critical results will be called.   Followup as needed for any active or acute issue. Please report any significant change in your symptoms. 

## 2014-12-23 NOTE — Progress Notes (Signed)
Pre visit review using our clinic review tool, if applicable. No additional management support is needed unless otherwise documented below in the visit note. 

## 2014-12-23 NOTE — Progress Notes (Signed)
   Subjective:    Patient ID: Miguel Joseph, male    DOB: [birth year], 74 y.o.   MRN: [id number]  HPI The patient is here to assess status of active health conditions.  He is on a low-carb diet; he does eat some red meat, fried foods as well as salt. He has never smoked. He averages 4-5 glasses of wine a week. Typically he walks 3 miles at least twice a week. This has decreased due to the change in weather. He has no associated cardio pulmonary  symptoms  Colonoscopy was completed in March of this year. Has no active GI symptoms  Review of systems is positive for nocturia 1-2 times per night.  He feels he has early cataracts as he has difficulty driving at night due to light refraction. He will see Dr. Katy Fitch in the near future  He is being evaluated for hearing aids by Dr Thornell Mule, ENT.  PMH, FH, & Social History reviewed & updated.No change in [place] as recorded.   Review of Systems  Chest pain, palpitations, tachycardia, exertional dyspnea, paroxysmal nocturnal dyspnea, claudication or edema are absent. No unexplained weight loss, abdominal pain, significant dyspepsia, dysphagia, melena, rectal bleeding, or persistently small caliber stools. Dysuria, pyuria, hematuria, frequency,  or polyuria are denied. Change in hair, skin, nails denied. No bowel changes of constipation or diarrhea. No intolerance to heat or cold.     Objective:   Physical Exam  Pertinent or positive findings include: He has pattern alopecia. Minimal DIP arthritic changes are noted in the fingers. He has minimal crepitus of the knees. Prostate exam was deferred as this was performed in March of this year. He has had no abnormalities on prior digital rectal exams and PSA has been within normal limits.  General appearance :adequately nourished; in no distress.  Eyes: No conjunctival inflammation or scleral icterus is present.  Oral exam:  Lips and gums are healthy appearing.There is no oropharyngeal erythema or exudate  noted. Dental hygiene is good.  Heart:  Normal rate and regular rhythm. S1 and S2 normal without gallop, murmur, click, rub or other extra sounds    Lungs:Chest clear to auscultation; no wheezes, rhonchi,rales ,or rubs present.No increased work of breathing.   Abdomen: bowel sounds normal, soft and non-tender without masses, organomegaly or hernias noted.  No guarding or rebound. No flank tenderness to percussion.  Vascular : all pulses equal ; no bruits present.  Skin:Warm & dry.  Intact without suspicious lesions or rashes ; no tenting or jaundice   Lymphatic: No lymphadenopathy is noted about the head, neck, axilla.   Neuro: Strength, tone & DTRs normal.      Assessment & Plan:  See Current Assessment & Plan in Problem List under specific Diagnosis

## 2014-12-23 NOTE — Assessment & Plan Note (Signed)
Lipids, LFTs, TSH  

## 2014-12-24 ENCOUNTER — Other Ambulatory Visit (INDEPENDENT_AMBULATORY_CARE_PROVIDER_SITE_OTHER): Payer: Medicare Other

## 2014-12-24 DIAGNOSIS — E785 Hyperlipidemia, unspecified: Secondary | ICD-10-CM | POA: Diagnosis not present

## 2014-12-24 LAB — BASIC METABOLIC PANEL
BUN: 26 mg/dL — AB (ref 6–23)
CO2: 27 mEq/L (ref 19–32)
CREATININE: 1.34 mg/dL (ref 0.40–1.50)
Calcium: 9.2 mg/dL (ref 8.4–10.5)
Chloride: 105 mEq/L (ref 96–112)
GFR: 55.23 mL/min — ABNORMAL LOW (ref >60.00)
Glucose, Bld: 92 mg/dL (ref 70–99)
Potassium: 4.6 mEq/L (ref 3.5–5.1)
Sodium: 139 mEq/L (ref 135–145)

## 2014-12-24 LAB — HEPATIC FUNCTION PANEL
ALT: 22 U/L (ref 0–53)
AST: 20 U/L (ref 0–37)
Albumin: 4.1 g/dL (ref 3.5–5.2)
Alkaline Phosphatase: 43 U/L (ref 39–117)
BILIRUBIN DIRECT: 0.1 mg/dL (ref 0.0–0.3)
BILIRUBIN TOTAL: 0.6 mg/dL (ref 0.2–1.2)
Total Protein: 6.7 g/dL (ref 6.0–8.3)

## 2014-12-24 LAB — TSH: TSH: 2.16 u[IU]/mL (ref 0.35–4.50)

## 2014-12-24 LAB — LIPID PANEL
CHOL/HDL RATIO: 3
CHOLESTEROL: 157 mg/dL (ref 0–200)
HDL: 46 mg/dL (ref >39.00)
LDL CALC: 94 mg/dL (ref 0–99)
NonHDL: 110.93
TRIGLYCERIDES: 85 mg/dL (ref 0.0–149.0)
VLDL: 17 mg/dL (ref 0.0–40.0)

## 2015-01-09 ENCOUNTER — Encounter: Payer: Self-pay | Admitting: Internal Medicine

## 2015-03-02 ENCOUNTER — Other Ambulatory Visit: Payer: Self-pay | Admitting: Internal Medicine

## 2015-03-05 ENCOUNTER — Other Ambulatory Visit: Payer: Self-pay | Admitting: Internal Medicine

## 2015-06-29 ENCOUNTER — Ambulatory Visit (INDEPENDENT_AMBULATORY_CARE_PROVIDER_SITE_OTHER): Payer: Medicare Other | Admitting: Internal Medicine

## 2015-06-29 ENCOUNTER — Encounter: Payer: Self-pay | Admitting: Internal Medicine

## 2015-06-29 VITALS — BP 114/64 | HR 76 | Temp 98.2°F | Ht 69.0 in | Wt 168.0 lb

## 2015-06-29 DIAGNOSIS — R7303 Prediabetes: Secondary | ICD-10-CM

## 2015-06-29 DIAGNOSIS — E785 Hyperlipidemia, unspecified: Secondary | ICD-10-CM | POA: Diagnosis not present

## 2015-06-29 MED ORDER — PRAVASTATIN SODIUM 40 MG PO TABS: 40.0000 mg | ORAL_TABLET | Freq: Every day | ORAL | Status: DC

## 2015-06-29 NOTE — Progress Notes (Signed)
Pre visit review using our clinic review tool, if applicable. No additional management support is needed unless otherwise documented below in the visit note. 

## 2015-06-29 NOTE — Progress Notes (Signed)
Subjective:    Patient ID: Miguel Joseph, male    DOB: 1940-05-16, 75 y.o.   MRN: [id number]  HPI He is here to establish with a new pcp.   He is here for follow up.  Hyperlipidemia: He is taking his medication daily. He is compliant with a low fat/cholesterol diet. He is very active with yard work, but not exercising regularly. He denies myalgias.   Prediabetes:  He has stopped eating many carbohydrates.  He has lost weight.  He is very active, but does not exercise.    Medications and allergies reviewed with patient and updated if appropriate.  Patient Active Problem List   Diagnosis Date Noted  . Diverticulosis of colon without hemorrhage 12/23/2014  . Rectal bleeding 12/19/2013  . NONSPECIFIC ABNORMAL ELECTROCARDIOGRAM 05/05/2009  . Hyperlipidemia 04/15/2008    Current Outpatient Prescriptions on File Prior to Visit  Medication Sig Dispense Refill  . aspirin EC 81 MG tablet Take 81 mg by mouth daily.    . Multiple Vitamins-Minerals (ANTIOXIDANT FORMULA SG) capsule Take 1 capsule by mouth daily.     No current facility-administered medications on file prior to visit.    Past Medical History  Diagnosis Date  . Hyperlipidemia   . E. coli UTI (urinary tract infection) 12/07/2012    Rx: Cipro  . Diverticulosis   . Anal fissure     Past Surgical History  Procedure Laterality Date  . Colonoscopy  2001 &;2007;2016    internal hemorrhoids; Dr Fuller Plan  . Tonsillectomy and adenoidectomy    . Appendectomy    . Wisdom tooth extraction      Social History   Social History  . Marital Status: Single    Spouse Name: N/A  . Number of Children: 3  . Years of Education: N/A   Occupational History  . retired Forensic psychologist    Social History Main Topics  . Smoking status: Never Smoker   . Smokeless tobacco: Never Used  . Alcohol Use: 2.4 oz/week    4 Glasses of wine per week  . Drug Use: Yes  . Sexual Activity: Not Asked   Other Topics Concern  . None   Social History  Narrative    Family History  Problem Relation Age of Onset  . Breast cancer Mother   . Testicular cancer Father   . Breast cancer Sister   . Pancreatic cancer Paternal Uncle      X2   . Heart block Paternal Aunt     pacer  . Diabetes Neg Hx   . Stroke Neg Hx   . Heart disease Neg Hx   . Hypertension Neg Hx   . Colon polyps Son 53    tubular adenoma  . Ovarian cancer Mother     Review of Systems  Constitutional: Negative for fever.  Respiratory: Negative for cough, shortness of breath and wheezing.   Cardiovascular: Negative for chest pain, palpitations and leg swelling.  Musculoskeletal: Negative for myalgias.  Neurological: Negative for light-headedness and headaches.       Objective:   Filed Vitals:   06/29/15 0835  BP: 114/64  Pulse: 76  Temp: 98.2 F (36.8 C)   Filed Weights   06/29/15 0835  Weight: 168 lb (76.204 kg)   Body mass index is 24.8 kg/(m^2).   Physical Exam Constitutional: Appears well-developed and well-nourished. No distress.  Neck: Neck supple. No tracheal deviation present. No thyromegaly present.  No carotid bruit. No cervical adenopathy.   Cardiovascular: Normal rate,  regular rhythm and normal heart sounds.   No murmur heard.  No edema Pulmonary/Chest: Effort normal and breath sounds normal. No respiratory distress. No wheezes.         Assessment & Plan:   See Problem List for Assessment and Plan of chronic medical problems.

## 2015-06-29 NOTE — Patient Instructions (Addendum)
   All other Health Maintenance issues reviewed.   All recommended immunizations and age-appropriate screenings are up-to-date or discussed.  No immunizations administered today.   Medications reviewed and updated.  No changes recommended at this time.   Please followup in November for a physical exam

## 2015-10-21 ENCOUNTER — Encounter: Payer: Self-pay | Admitting: Gastroenterology

## 2015-11-30 ENCOUNTER — Encounter: Payer: Self-pay | Admitting: Internal Medicine

## 2015-12-24 ENCOUNTER — Encounter: Payer: Medicare Other | Admitting: Internal Medicine

## 2016-02-02 ENCOUNTER — Encounter: Payer: Self-pay | Admitting: Internal Medicine

## 2016-02-08 NOTE — Assessment & Plan Note (Signed)
Check a1c 

## 2016-02-08 NOTE — Progress Notes (Signed)
Subjective:    Patient ID: Miguel Joseph, male    DOB: [birth year], 76 y.o.   MRN: [id number]  HPI Here for medicare wellness exam and annual physical exam.   Back pain:  He has had issues with back pain.  He has right lower back pain.  Certain activities will aggravate it.  It denies pain when he sleeps.  He has pain in his right leg down to his knee after standing long periods of time - it is in the anterior part of his leg - L2-L3 distribution. He notices it a long when he cooks and is standing a lot in the kitchen, especially when leaning forward.  He has a muscle knot in his right lower back that gets bigger as the day goes on.   He denies numbness/tingling in the right leg.   He takes advil as needed.   He thinks he is getting shorter.    I have personally reviewed and have noted 1.The patient's medical and social history 2.Their use of alcohol, tobacco or illicit drugs 3.Their current medications and supplements 4.The patient's functional ability including ADL's, fall risks, home safety risks                   and hearing or visual impairment. 5.Diet and physical activities 6.Evidence for depression or mood disorders 7.Care team reviewed - Eye - Dr Katy Fitch, [facility] - Dr Ubaldo Glassing   Are there smokers in your home (other than you)? No  Risk Factors Exercise: yard work, no formal exercise Dietary issues discussed: well balanced, eats at home; avoid carbs/sugars  Cardiac risk factors: advanced age, hyperlipidemia.  Depression Screen  Have you felt down, depressed or hopeless? No  Have you felt little interest or pleasure in doing things?  No  Activities of Daily Living In your present state of health, do you have any difficulty performing the following activities?:  Driving? No Managing money?  No Feeding yourself? No Getting from bed to chair? No Climbing a flight of stairs? No Preparing food and eating?:  No Bathing or showering? No Getting dressed: No Getting to/using the toilet? No Moving around from place to place: No In the past year have you fallen or had a near fall?: No   Are you sexually active?  Yes   Do you have more than one partner?  No   Hearing Difficulties:  Do you often ask people to speak up or repeat themselves? Yes  Do you experience ringing or noises in your ears? No Do you have difficulty understanding soft or whispered voices? yes Vision:              Any change in vision:  No              Up to date with eye exam:  Yes  Memory:  Do you feel that you have a problem with memory? No  Do you often misplace items? No  Do you feel safe at home?  Yes  Cognitive Testing  Alert, Orientated? Yes  Normal Appearance? Yes  Recall of three objects?  Yes  Can perform simple calculations? Yes  Displays appropriate judgment? Yes  Can read the correct time from a watch face? Yes   Advanced Directives have been discussed with the patient? Yes - in place   Medications and allergies reviewed with patient and updated if appropriate.  Patient Active Problem List   Diagnosis Date Noted  . Legally blind in left eye, as  defined in [place] 02/09/2016  . Lumbar radiculopathy 02/09/2016  . Prediabetes 06/29/2015  . Diverticulosis of colon without hemorrhage 12/23/2014  . NONSPECIFIC ABNORMAL ELECTROCARDIOGRAM 05/05/2009  . Hyperlipidemia 04/15/2008    Current Outpatient Prescriptions on File Prior to Visit  Medication Sig Dispense Refill  . aspirin EC 81 MG tablet Take 81 mg by mouth daily.    . Multiple Vitamins-Minerals (ANTIOXIDANT FORMULA SG) capsule Take 1 capsule by mouth daily.    . pravastatin (PRAVACHOL) 40 MG tablet Take 1 tablet (40 mg total) by mouth daily. Must transition care to new provider for future refills 90 tablet 1   No current facility-administered medications on file prior to visit.     Past Medical History:  Diagnosis Date  . Anal fissure   .  Diverticulosis   . E. coli UTI (urinary tract infection) 12/07/2012   Rx: Cipro  . Hyperlipidemia     Past Surgical History:  Procedure Laterality Date  . APPENDECTOMY    . COLONOSCOPY  2001 &;2007;2016   internal hemorrhoids; Dr Fuller Plan  . TONSILLECTOMY AND ADENOIDECTOMY    . WISDOM TOOTH EXTRACTION      Social History   Social History  . Marital status: Single    Spouse name: N/A  . Number of children: 3  . Years of education: N/A   Occupational History  . retired Forensic psychologist    Social History Main Topics  . Smoking status: Never Smoker  . Smokeless tobacco: Never Used  . Alcohol use 2.4 oz/week    4 Glasses of wine per week  . Drug use:   . Sexual activity: Not Asked   Other Topics Concern  . None   Social History Narrative  . None    Family History  Problem Relation Age of Onset  . Breast cancer Mother   . Ovarian cancer Mother   . Testicular cancer Father   . Breast cancer Sister   . Pancreatic cancer Paternal Uncle      X2   . Heart block Paternal Aunt     pacer  . Colon polyps Son 64    tubular adenoma  . Diabetes Neg Hx   . Stroke Neg Hx   . Heart disease Neg Hx   . Hypertension Neg Hx     Review of Systems  Constitutional: Negative for chills and fever.  HENT: Positive for hearing loss. Negative for tinnitus.   Eyes: Negative for visual disturbance.  Respiratory: Negative for cough, shortness of breath and wheezing.   Cardiovascular: Negative for chest pain, palpitations and leg swelling.  Gastrointestinal: Negative for abdominal pain, blood in stool, constipation and diarrhea.  Genitourinary: Negative for dysuria and hematuria.  Musculoskeletal: Positive for back pain.  Skin: Negative for color change and rash.  Neurological: Negative for light-headedness and headaches.  Psychiatric/Behavioral: Negative for dysphoric mood. The patient is not nervous/anxious.        Objective:   Vitals:   02/09/16 0843  BP: 130/72  Pulse: 68  Resp:  16  Temp: 97.9 F (36.6 C)   Filed Weights   02/09/16 0843  Weight: 167 lb (75.8 kg)   Body mass index is 24.66 kg/m.  Wt Readings from Last 3 Encounters:  02/09/16 167 lb (75.8 kg)  06/29/15 168 lb (76.2 kg)  12/23/14 162 lb (73.5 kg)     Physical Exam Constitutional: He appears well-developed and well-nourished. No distress.  HENT:  Head: Normocephalic and atraumatic.  Right Ear: External ear normal.  Left Ear:  External ear normal.  Mouth/Throat: Oropharynx is clear and moist.  Normal ear canals and TM b/l  Eyes: Conjunctivae and EOM are normal.  Neck: Neck supple. No tracheal deviation present. No thyromegaly present.  No carotid bruit  Cardiovascular: Normal rate, regular rhythm, normal heart sounds and intact distal pulses.   No murmur heard. Pulmonary/Chest: Effort normal and breath sounds normal. No respiratory distress. He has no wheezes. He has no rales.  Abdominal: Soft. Bowel sounds are normal. He exhibits no distension. There is no tenderness.  Genitourinary: deferred  Musculoskeletal: He exhibits no edema.  Lymphadenopathy:   He has no cervical adenopathy.  Skin: Skin is warm and dry. He is not diaphoretic.  Psychiatric: He has a normal mood and affect. His behavior is normal.         Assessment & Plan:   Wellness Exam: Immunizations  Up to date  Colonoscopy  -  No longer needed at his age, last 04/2014 Eye exam  Up to date  Hearing loss - yes, does not want hearing aids at this time Memory concerns/difficulties  none Independent of ADLs  fully Stressed the importance of regular exercise   Patient received copy of preventative screening tests/immunizations recommended for the next 5-10 years.   Physical exam: Screening blood work ordered Immunizations   Up to date  Colonoscopy   - no longer needed at his age Eye exams  Up to date  EKG - no indication for EKG today, last EKG normal Exercise  - active, yard work, no formal exercise Weight   Normal BMI Skin - no concerns - sees derm annualy Substance abuse  none  See Problem List for Assessment and Plan of chronic medical problems.   FU annually

## 2016-02-08 NOTE — Patient Instructions (Signed)
Mr. Miguel Joseph , Thank you for taking time to come for your Medicare Wellness Visit. I appreciate your ongoing commitment to your health goals. Please review the following plan we discussed and let me know if I can assist you in the future.   These are the goals we discussed: Goals    None      This is a list of the screening recommended for you and due dates:  Health Maintenance  Topic Date Due  . Tetanus Vaccine  10/22/2023  . Flu Shot  Completed  . Shingles Vaccine  Completed  . Pneumonia vaccines  Completed     Test(s) ordered today. Your results will be released to [place] (or called to you) after review, usually within 72hours after test completion. If any changes need to be made, you will be notified at that same time.  All other Health Maintenance issues reviewed.   All recommended immunizations and age-appropriate screenings are up-to-date or discussed.  No immunizations administered today.   Medications reviewed and updated.  No changes recommended at this time.  Your prescription(s) have been submitted to your pharmacy. Please take as directed and contact our office if you believe you are having problem(s) with the medication(s).   Please followup in one year for a physical exam.   Health Maintenance, Male A healthy lifestyle and preventative care can promote health and wellness.  Maintain regular health, dental, and eye exams.  Eat a healthy diet. Foods like vegetables, fruits, whole grains, low-fat dairy products, and lean protein foods contain the nutrients you need and are low in calories. Decrease your intake of foods high in solid fats, added sugars, and salt. Get information about a proper diet from your health care provider, if necessary.  Regular physical exercise is one of the most important things you can do for your health. Most adults should get at least 150 minutes of moderate-intensity exercise (any activity that increases your heart rate and causes you  to sweat) each week. In addition, most adults need muscle-strengthening exercises on 2 or more days a week.   Maintain a healthy weight. The body mass index (BMI) is a screening tool to identify possible weight problems. It provides an estimate of body fat based on height and weight. Your health care provider can find your BMI and can help you achieve or maintain a healthy weight. For males 20 years and older:  A BMI below 18.5 is considered underweight.  A BMI of 18.5 to 24.9 is normal.  A BMI of 25 to 29.9 is considered overweight.  A BMI of 30 and above is considered obese.  Maintain normal blood lipids and cholesterol by exercising and minimizing your intake of saturated fat. Eat a balanced diet with plenty of fruits and vegetables. Blood tests for lipids and cholesterol should begin at age 59 and be repeated every 5 years. If your lipid or cholesterol levels are high, you are over age 60, or you are at high risk for heart disease, you may need your cholesterol levels checked more frequently.Ongoing high lipid and cholesterol levels should be treated with medicines if diet and exercise are not working.  If you smoke, find out from your health care provider how to quit. If you do not use tobacco, do not start.  Lung cancer screening is recommended for adults aged 72-80 years who are at high risk for developing lung cancer because of a history of smoking. A yearly low-dose CT scan of the lungs is recommended for  people who have at least a 30-pack-year history of smoking and are current smokers or have quit within the past 15 years. A pack year of smoking is smoking an average of 1 pack of cigarettes a day for 1 year (for example, a 30-pack-year history of smoking could mean smoking 1 pack a day for 30 years or 2 packs a day for 15 years). Yearly screening should continue until the smoker has stopped smoking for at least 15 years. Yearly screening should be stopped for people who develop a health  problem that would prevent them from having lung cancer treatment.  If you choose to drink alcohol, do not have more than 2 drinks per day. One drink is considered to be 12 oz (360 mL) of beer, 5 oz (150 mL) of wine, or 1.5 oz (45 mL) of liquor.  Avoid the use of street drugs. Do not share needles with anyone. Ask for help if you need support or instructions about stopping the use of drugs.  High blood pressure causes heart disease and increases the risk of stroke. High blood pressure is more likely to develop in:  People who have blood pressure in the end of the normal range (100-139/85-89 mm Hg).  People who are overweight or obese.  People who are African American.  If you are 27-33 years of age, have your blood pressure checked every 3-5 years. If you are 8 years of age or older, have your blood pressure checked every year. You should have your blood pressure measured twice-once when you are at a hospital or clinic, and once when you are not at a hospital or clinic. Record the average of the two measurements. To check your blood pressure when you are not at a hospital or clinic, you can use:  An automated blood pressure machine at a pharmacy.  A home blood pressure monitor.  If you are 14-10 years old, ask your health care provider if you should take aspirin to prevent heart disease.  Diabetes screening involves taking a blood sample to check your fasting blood sugar level. This should be done once every 3 years after age 58 if you are at a normal weight and without risk factors for diabetes. Testing should be considered at a younger age or be carried out more frequently if you are overweight and have at least 1 risk factor for diabetes.  Colorectal cancer can be detected and often prevented. Most routine colorectal cancer screening begins at the age of 37 and continues through age 53. However, your health care provider may recommend screening at an earlier age if you have risk factors  for colon cancer. On a yearly basis, your health care provider may provide home test kits to check for hidden blood in the stool. A small camera at the end of a tube may be used to directly examine the colon (sigmoidoscopy or colonoscopy) to detect the earliest forms of colorectal cancer. Talk to your health care provider about this at age 19 when routine screening begins. A direct exam of the colon should be repeated every 5-10 years through age 1, unless early forms of precancerous polyps or small growths are found.  People who are at an increased risk for hepatitis B should be screened for this virus. You are considered at high risk for hepatitis B if:  You were born in a country where hepatitis B occurs often. Talk with your health care provider about which countries are considered high risk.  Your parents were  born in a high-risk country and you have not received a shot to protect against hepatitis B (hepatitis B vaccine).  You have HIV or AIDS.  You use needles to inject street drugs.  You live with, or have sex with, someone who has hepatitis B.  You are a man who has sex with other men (MSM).  You get hemodialysis treatment.  You take certain medicines for conditions like cancer, organ transplantation, and autoimmune conditions.  Hepatitis C blood testing is recommended for all people born from [birth year] through 1966 and any individual with known risk factors for hepatitis C.  Healthy men should no longer receive prostate-specific antigen (PSA) blood tests as part of routine cancer screening. Talk to your health care provider about prostate cancer screening.  Testicular cancer screening is not recommended for adolescents or adult males who have no symptoms. Screening includes self-exam, a health care provider exam, and other screening tests. Consult with your health care provider about any symptoms you have or any concerns you have about testicular cancer.  Practice safe sex. Use  condoms and avoid high-risk sexual practices to reduce the spread of sexually transmitted infections (STIs).  You should be screened for STIs, including gonorrhea and chlamydia if:  You are sexually active and are younger than 24 years.  You are older than 24 years, and your health care provider tells you that you are at risk for this type of infection.  Your sexual activity has changed since you were last screened, and you are at an increased risk for chlamydia or gonorrhea. Ask your health care provider if you are at risk.  If you are at risk of being infected with HIV, it is recommended that you take a prescription medicine daily to prevent HIV infection. This is called pre-exposure prophylaxis (PrEP). You are considered at risk if:  You are a man who has sex with other men (MSM).  You are a heterosexual man who is sexually active with multiple partners.  You take drugs by injection.  You are sexually active with a partner who has HIV.  Talk with your health care provider about whether you are at high risk of being infected with HIV. If you choose to begin PrEP, you should first be tested for HIV. You should then be tested every 3 months for as long as you are taking PrEP.  Use sunscreen. Apply sunscreen liberally and repeatedly throughout the day. You should seek shade when your shadow is shorter than you. Protect yourself by wearing long sleeves, pants, a wide-brimmed hat, and sunglasses year round whenever you are outdoors.  Tell your health care provider of new moles or changes in moles, especially if there is a change in shape or color. Also, tell your health care provider if a mole is larger than the size of a pencil eraser.  A one-time screening for abdominal aortic aneurysm (AAA) and surgical repair of large AAAs by ultrasound is recommended for men aged 49-75 years who are current or former smokers.  Stay current with your vaccines (immunizations). This information is not  intended to replace advice given to you by your health care provider. Make sure you discuss any questions you have with your health care provider. Document Released: 07/23/2007 Document Revised: 02/14/2014 Document Reviewed: 10/28/2014 Elsevier Interactive Patient Education  2017 [employer].

## 2016-02-08 NOTE — Assessment & Plan Note (Signed)
Check lipid panel  Continue daily statin Regular exercise and healthy diet encouraged  

## 2016-02-09 ENCOUNTER — Encounter: Payer: Self-pay | Admitting: Internal Medicine

## 2016-02-09 ENCOUNTER — Ambulatory Visit (INDEPENDENT_AMBULATORY_CARE_PROVIDER_SITE_OTHER): Payer: Medicare Other | Admitting: Internal Medicine

## 2016-02-09 VITALS — BP 130/72 | HR 68 | Temp 97.9°F | Resp 16 | Ht 69.0 in | Wt 167.0 lb

## 2016-02-09 DIAGNOSIS — R7303 Prediabetes: Secondary | ICD-10-CM

## 2016-02-09 DIAGNOSIS — H548 Legal blindness, as defined in USA: Secondary | ICD-10-CM | POA: Insufficient documentation

## 2016-02-09 DIAGNOSIS — Z0000 Encounter for general adult medical examination without abnormal findings: Secondary | ICD-10-CM

## 2016-02-09 DIAGNOSIS — E7800 Pure hypercholesterolemia, unspecified: Secondary | ICD-10-CM | POA: Diagnosis not present

## 2016-02-09 DIAGNOSIS — M5416 Radiculopathy, lumbar region: Secondary | ICD-10-CM

## 2016-02-09 DIAGNOSIS — Z0001 Encounter for general adult medical examination with abnormal findings: Secondary | ICD-10-CM

## 2016-02-09 MED ORDER — PRAVASTATIN SODIUM 40 MG PO TABS: 40.0000 mg | ORAL_TABLET | Freq: Every day | ORAL | 3 refills | Status: DC

## 2016-02-09 NOTE — Assessment & Plan Note (Addendum)
His pain is likely related to arthritis/degenerative changes Advil, ice  Revise activities Deferred sports medicine referral - will consider

## 2016-02-09 NOTE — Progress Notes (Signed)
Pre visit review using our clinic review tool, if applicable. No additional management support is needed unless otherwise documented below in the visit note. 

## 2016-02-10 ENCOUNTER — Other Ambulatory Visit (INDEPENDENT_AMBULATORY_CARE_PROVIDER_SITE_OTHER): Payer: Medicare Other

## 2016-02-10 DIAGNOSIS — E7800 Pure hypercholesterolemia, unspecified: Secondary | ICD-10-CM | POA: Diagnosis not present

## 2016-02-10 DIAGNOSIS — Z0000 Encounter for general adult medical examination without abnormal findings: Secondary | ICD-10-CM | POA: Diagnosis not present

## 2016-02-10 DIAGNOSIS — R7303 Prediabetes: Secondary | ICD-10-CM | POA: Diagnosis not present

## 2016-02-10 LAB — COMPREHENSIVE METABOLIC PANEL
ALBUMIN: 4.4 g/dL (ref 3.5–5.2)
ALK PHOS: 47 U/L (ref 39–117)
ALT: 24 U/L (ref 0–53)
AST: 21 U/L (ref 0–37)
BILIRUBIN TOTAL: 0.6 mg/dL (ref 0.2–1.2)
BUN: 25 mg/dL — ABNORMAL HIGH (ref 6–23)
CALCIUM: 9.3 mg/dL (ref 8.4–10.5)
CO2: 28 mEq/L (ref 19–32)
Chloride: 102 mEq/L (ref 96–112)
Creatinine, Ser: 1.51 mg/dL — ABNORMAL HIGH (ref 0.40–1.50)
GFR: 47.97 mL/min — AB (ref >60.00)
Glucose, Bld: 96 mg/dL (ref 70–99)
Potassium: 4.3 mEq/L (ref 3.5–5.1)
Sodium: 138 mEq/L (ref 135–145)
Total Protein: 6.9 g/dL (ref 6.0–8.3)

## 2016-02-10 LAB — CBC WITH DIFFERENTIAL/PLATELET
BASOS ABS: 0 10*3/uL (ref 0.0–0.1)
BASOS PCT: 0.5 % (ref 0.0–3.0)
EOS ABS: 0.2 10*3/uL (ref 0.0–0.7)
Eosinophils Relative: 3.1 % (ref 0.0–5.0)
HEMATOCRIT: 47 % (ref 39.0–52.0)
HEMOGLOBIN: 16.2 g/dL (ref 13.0–17.0)
LYMPHS PCT: 20.5 % (ref 12.0–46.0)
Lymphs Abs: 1.5 10*3/uL (ref 0.7–4.0)
MCHC: 34.5 g/dL (ref 30.0–36.0)
MCV: 93.1 fl (ref 78.0–100.0)
MONOS PCT: 10.3 % (ref 3.0–12.0)
Monocytes Absolute: 0.8 10*3/uL (ref 0.1–1.0)
NEUTROS ABS: 4.9 10*3/uL (ref 1.4–7.7)
Neutrophils Relative %: 65.6 % (ref 43.0–77.0)
PLATELETS: 205 10*3/uL (ref 150.0–400.0)
RBC: 5.04 Mil/uL (ref 4.22–5.81)
RDW: 14.6 % (ref 11.5–15.5)
WBC: 7.5 10*3/uL (ref 4.0–10.5)

## 2016-02-10 LAB — LIPID PANEL
CHOLESTEROL: 177 mg/dL (ref 0–200)
HDL: 46.6 mg/dL (ref >39.00)
LDL Cholesterol: 109 mg/dL — ABNORMAL HIGH (ref 0–99)
NonHDL: 130.13
TRIGLYCERIDES: 104 mg/dL (ref 0.0–149.0)
Total CHOL/HDL Ratio: 4
VLDL: 20.8 mg/dL (ref 0.0–40.0)

## 2016-02-10 LAB — HEMOGLOBIN A1C: Hgb A1c MFr Bld: 5.7 % (ref 4.6–6.5)

## 2016-02-10 LAB — TSH: TSH: 2.85 u[IU]/mL (ref 0.35–4.50)

## 2016-02-11 ENCOUNTER — Encounter: Payer: Self-pay | Admitting: Internal Medicine

## 2016-02-13 ENCOUNTER — Other Ambulatory Visit: Payer: Self-pay | Admitting: Internal Medicine

## 2016-11-15 ENCOUNTER — Encounter: Payer: Self-pay | Admitting: Internal Medicine

## 2017-02-08 DIAGNOSIS — N189 Chronic kidney disease, unspecified: Secondary | ICD-10-CM | POA: Insufficient documentation

## 2017-02-08 NOTE — Progress Notes (Signed)
Subjective:    Patient ID: Miguel Joseph, male    DOB: 1941/02/06, 77 y.o.   MRN: [id number]  HPI Here for medicare wellness exam and an annual physical exam.   I have personally reviewed and have noted 1.The patient's medical and social history 2.Their use of alcohol, tobacco or illicit drugs 3.Their current medications and supplements 4.The patient's functional ability including ADL's, fall risks, home                 safety risk and hearing or visual impairment. 5.Diet and physical activities 6.Evidence for depression or mood disorders 7.Care team reviewed  -  Dr Katy Fitch - eye, Dr Ubaldo Glassing Payton Mccallum   He still has intermittent back pain.     Are there smokers in your home (other than you)? No  Risk Factors Exercise:  Goes to gym - weights, elliptical, yard work Dietary issues discussed: well balanced, rarely drinks soda, rarely eats fries, pasta,junk food  Vitamin and supplement use:  MVI  Opiod use:  none   Cardiac risk factors: advanced age, hyperlipidemia  Depression Screen  Have you felt down, depressed or hopeless? No  Have you felt little interest or pleasure in doing things?  No  Activities of Daily Living In your present state of health, do you have any difficulty performing the following activities?:  Driving? No Managing money?  No Feeding yourself? No Getting from bed to chair? No Climbing a flight of stairs? No Preparing food and eating?: No Bathing or showering? No Getting dressed: No Getting to/using the toilet? No Moving around from place to place: No In the past year have you fallen or had a near fall?: No   Are you sexually active?  yes  Do you have more than one partner?  no  Hearing Difficulties: yes - wears hearing aids Do you often ask people to speak up or repeat themselves? yes Do you experience ringing or noises in your ears? No Do you have difficulty understanding soft or  whispered voices? yes Vision:              Any change in vision:   no             Up to date with eye exam:   yes  Memory:  Do you feel that you have a problem with memory? No, except name recall  Do you often misplace items? No  Do you feel safe at home?  Yes  Cognitive Testing  Alert, Orientated? Yes  Normal Appearance? Yes  Recall of three objects?  Yes  Can perform simple calculations? Yes  Displays appropriate judgment? Yes  Can read the correct time from a watch face? Yes   Advanced Directives have been discussed with the patient? Yes - in place    Medications and allergies reviewed with patient and updated if appropriate.  Patient Active Problem List   Diagnosis Date Noted  . CKD (chronic kidney disease) 02/08/2017  . 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 Lumbar radiculopathy 02/09/2016  . Prediabetes 06/29/2015  . Diverticulosis of colon without hemorrhage 12/23/2014  . NONSPECIFIC ABNORMAL ELECTROCARDIOGRAM 05/05/2009  . Hyperlipidemia 04/15/2008    Current Outpatient Medications on File Prior to Visit  Medication Sig Dispense Refill  . aspirin EC 81 MG tablet Take 81 mg by mouth daily.    . Multiple Vitamins-Minerals (ANTIOXIDANT FORMULA SG) capsule Take 1 capsule by mouth daily.    . pravastatin (PRAVACHOL) 40  MG tablet Take 1 tablet (40 mg total) by mouth daily. 90 tablet 3   No current facility-administered medications on file prior to visit.     Past Medical History:  Diagnosis Date  . Anal fissure   . Diverticulosis   . E. coli UTI (urinary tract infection) 12/07/2012   Rx: Cipro  . Hyperlipidemia     Past Surgical History:  Procedure Laterality Date  . APPENDECTOMY    . COLONOSCOPY  2001 &;2007;2016   internal hemorrhoids; Dr Fuller Plan  . TONSILLECTOMY AND ADENOIDECTOMY    . WISDOM TOOTH EXTRACTION      Social History   Socioeconomic History  . Marital status: Single    Spouse name: None  . Number of children: 3  .  Years of education: None  . Highest education level: None  Social Needs  . Financial resource strain: None  . Food insecurity - worry: None  . Food insecurity - inability: None  . Transportation needs - medical: None  . Transportation needs - non-medical: None  Occupational History  . Occupation: retired Forensic psychologist  Tobacco Use  . Smoking status: Never Smoker  . Smokeless tobacco: Never Used  Substance and Sexual Activity  . Alcohol use: Yes    Alcohol/week: 2.4 oz    Types: 4 Glasses of wine per week  . Drug use: Yes  . Sexual activity: None  Other Topics Concern  . None  Social History Narrative  . None    Family History  Problem Relation Age of Onset  . Breast cancer Mother   . Ovarian cancer Mother   . Testicular cancer Father   . Breast cancer Sister   . Pancreatic cancer Paternal Uncle         X2   . Heart block Paternal Aunt        pacer  . Colon polyps Son 40       tubular adenoma  . Diabetes Neg Hx   . Stroke Neg Hx   . Heart disease Neg Hx   . Hypertension Neg Hx     Review of Systems  Constitutional: Negative for chills and fever.  HENT: Positive for hearing loss.   Eyes: Negative for visual disturbance.  Respiratory: Negative for cough, shortness of breath and wheezing.   Cardiovascular: Negative for chest pain, palpitations and leg swelling.  Gastrointestinal: Negative for abdominal pain, blood in stool, constipation, diarrhea and nausea.  Genitourinary: Negative for dysuria and hematuria.  Musculoskeletal: Positive for arthralgias (hip occasionally) and back pain.  Skin: Negative for color change and rash.  Neurological: Negative for dizziness, light-headedness, numbness and headaches.  Psychiatric/Behavioral: Negative for dysphoric mood. The patient is not nervous/anxious.        Objective:   Vitals:   02/09/17 0808  BP: 102/64  Pulse: 61  Resp: 16  Temp: 97.6 F (36.4 C)  SpO2: 97%   Filed Weights   02/09/17 0808  Weight: 169 lb  (76.7 kg)   Body mass index is 24.96 kg/m.  Wt Readings from Last 3 Encounters:  02/09/17 169 lb (76.7 kg)  02/09/16 167 lb (75.8 kg)  06/29/15 168 lb (76.2 kg)     Physical Exam Constitutional: He appears well-developed and well-nourished. No distress.  HENT:  Head: Normocephalic and atraumatic.  Right Ear: External ear normal.  Left Ear: External ear normal.  Mouth/Throat: Oropharynx is clear and moist.  Normal ear canals and TM b/l  Eyes: Conjunctivae and EOM are normal.  Neck: Neck supple. No  tracheal deviation present. No thyromegaly present.  No carotid bruit  Cardiovascular: Normal rate, regular rhythm, normal heart sounds and intact distal pulses.   No murmur heard. Pulmonary/Chest: Effort normal and breath sounds normal. No respiratory distress. He has no wheezes. He has no rales.  Abdominal: Soft. He exhibits no distension. There is no tenderness.  Genitourinary: deferred  Musculoskeletal: He exhibits no edema.  Lymphadenopathy:   He has no cervical adenopathy.  Skin: Skin is warm and dry. He is not diaphoretic.  Psychiatric: He has a normal mood and affect. His behavior is normal.         Assessment & Plan:   Wellness Exam: Immunizations  Discussed shingrix, others up to date Colonoscopy  -  Last done 2016, no longer needed Eye exam   Up to date  Hearing loss - yes - wearing hearing aids Memory concerns/difficulties  none Independent of ADLs     Fully independent Stressed the importance of regular exercise   Patient received copy of preventative screening tests/immunizations recommended for the next 5-10 years.  Physical exam: Screening blood work    ordered Immunizations  Discussed shingrix, others up to date Colonoscopy  -  Last done 2016, no longer needed Eye exams   Up to date  EKG  Last done 2013 Exercise - regular - gym and yardwork Weight  Normal BMI Skin   No concerns Substance abuse    none  See Problem List for Assessment and Plan of  chronic medical problems.   FU annually

## 2017-02-08 NOTE — Patient Instructions (Addendum)
Miguel Joseph , Thank you for taking time to come for your Medicare Wellness Visit. I appreciate your ongoing commitment to your health goals. Please review the following plan we discussed and let me know if I can assist you in the future.   These are the goals we discussed: Goals    None      This is a list of the screening recommended for you and due dates:  Health Maintenance  Topic Date Due  . Tetanus Vaccine  10/22/2023  . Flu Shot  Completed  . Pneumonia vaccines  Completed      Test(s) ordered today. Your results will be released to [facility] (or called to you) after review, usually within 72hours after test completion. If any changes need to be made, you will be notified at that same time.  All other Health Maintenance issues reviewed.   All recommended immunizations and age-appropriate screenings are up-to-date or discussed.  No immunizations administered today.   Medications reviewed and updated.  No changes recommended at this time.  Your prescription(s) have been submitted to your pharmacy. Please take as directed and contact our office if you believe you are having problem(s) with the medication(s).  Please followup in one year    Health Maintenance, Male A healthy lifestyle and preventive care is important for your health and wellness. Ask your health care provider about what schedule of regular examinations is right for you. What should I know about weight and diet? Eat a Healthy Diet  Eat plenty of vegetables, fruits, whole grains, low-fat dairy products, and lean protein.  Do not eat a lot of foods high in solid fats, added sugars, or salt.  Maintain a Healthy Weight Regular exercise can help you achieve or maintain a healthy weight. You should:  Do at least 150 minutes of exercise each week. The exercise should increase your heart rate and make you sweat (moderate-intensity exercise).  Do strength-training exercises at least twice a week.  Watch Your  Levels of Cholesterol and Blood Lipids  Have your blood tested for lipids and cholesterol every 5 years starting at 78 years of age. If you are at high risk for heart disease, you should start having your blood tested when you are 78 years old. You may need to have your cholesterol levels checked more often if: ? Your lipid or cholesterol levels are high. ? You are older than 78 years of age. ? You are at high risk for heart disease.  What should I know about cancer screening? Many types of cancers can be detected early and may often be prevented. Lung Cancer  You should be screened every year for lung cancer if: ? You are a current smoker who has smoked for at least 30 years. ? You are a former smoker who has quit within the past 15 years.  Talk to your health care provider about your screening options, when you should start screening, and how often you should be screened.  Colorectal Cancer  Routine colorectal cancer screening usually begins at 78 years of age and should be repeated every 5-10 years until you are 78 years old. You may need to be screened more often if early forms of precancerous polyps or small growths are found. Your health care provider may recommend screening at an earlier age if you have risk factors for colon cancer.  Your health care provider may recommend using home test kits to check for hidden blood in the stool.  A small camera at  the end of a tube can be used to examine your colon (sigmoidoscopy or colonoscopy). This checks for the earliest forms of colorectal cancer.  Prostate and Testicular Cancer  Depending on your age and overall health, your health care provider may do certain tests to screen for prostate and testicular cancer.  Talk to your health care provider about any symptoms or concerns you have about testicular or prostate cancer.  Skin Cancer  Check your skin from head to toe regularly.  Tell your health care provider about any new moles  or changes in moles, especially if: ? There is a change in a mole's size, shape, or color. ? You have a mole that is larger than a pencil eraser.  Always use sunscreen. Apply sunscreen liberally and repeat throughout the day.  Protect yourself by wearing long sleeves, pants, a wide-brimmed hat, and sunglasses when outside.  What should I know about heart disease, diabetes, and high blood pressure?  If you are 88-60 years of age, have your blood pressure checked every 3-5 years. If you are 57 years of age or older, have your blood pressure checked every year. You should have your blood pressure measured twice-once when you are at a hospital or clinic, and once when you are not at a hospital or clinic. Record the average of the two measurements. To check your blood pressure when you are not at a hospital or clinic, you can use: ? An automated blood pressure machine at a pharmacy. ? A home blood pressure monitor.  Talk to your health care provider about your target blood pressure.  If you are between 39-40 years old, ask your health care provider if you should take aspirin to prevent heart disease.  Have regular diabetes screenings by checking your fasting blood sugar level. ? If you are at a normal weight and have a low risk for diabetes, have this test once every three years after the age of 46. ? If you are overweight and have a high risk for diabetes, consider being tested at a younger age or more often.  A one-time screening for abdominal aortic aneurysm (AAA) by ultrasound is recommended for men aged 43-75 years who are current or former smokers. What should I know about preventing infection? Hepatitis B If you have a higher risk for hepatitis B, you should be screened for this virus. Talk with your health care provider to find out if you are at risk for hepatitis B infection. Hepatitis C Blood testing is recommended for:  Everyone born from [birth year] through 1966.  Anyone with known  risk factors for hepatitis C.  Sexually Transmitted Diseases (STDs)  You should be screened each year for STDs including gonorrhea and chlamydia if: ? You are sexually active and are younger than 78 years of age. ? You are older than 78 years of age and your health care provider tells you that you are at risk for this type of infection. ? Your sexual activity has changed since you were last screened and you are at an increased risk for chlamydia or gonorrhea. Ask your health care provider if you are at risk.  Talk with your health care provider about whether you are at high risk of being infected with HIV. Your health care provider may recommend a prescription medicine to help prevent HIV infection.  What else can I do?  Schedule regular health, dental, and eye exams.  Stay current with your vaccines (immunizations).  Do not use any tobacco products, such  as cigarettes, chewing tobacco, and e-cigarettes. If you need help quitting, ask your health care provider.  Limit alcohol intake to no more than 2 drinks per day. One drink equals 12 ounces of beer, 5 ounces of wine, or 1 ounces of hard liquor.  Do not use street drugs.  Do not share needles.  Ask your health care provider for help if you need support or information about quitting drugs.  Tell your health care provider if you often feel depressed.  Tell your health care provider if you have ever been abused or do not feel safe at home. This information is not intended to replace advice given to you by your health care provider. Make sure you discuss any questions you have with your health care provider. Document Released: 07/23/2007 Document Revised: 09/23/2015 Document Reviewed: 10/28/2014 Elsevier Interactive Patient Education  [employer].

## 2017-02-09 ENCOUNTER — Encounter: Payer: Self-pay | Admitting: Internal Medicine

## 2017-02-09 ENCOUNTER — Ambulatory Visit (INDEPENDENT_AMBULATORY_CARE_PROVIDER_SITE_OTHER): Payer: Medicare Other | Admitting: Internal Medicine

## 2017-02-09 ENCOUNTER — Other Ambulatory Visit (INDEPENDENT_AMBULATORY_CARE_PROVIDER_SITE_OTHER): Payer: Medicare Other

## 2017-02-09 VITALS — BP 102/64 | HR 61 | Temp 97.6°F | Resp 16 | Ht 69.0 in | Wt 169.0 lb

## 2017-02-09 DIAGNOSIS — E7849 Other hyperlipidemia: Secondary | ICD-10-CM | POA: Diagnosis not present

## 2017-02-09 DIAGNOSIS — R7303 Prediabetes: Secondary | ICD-10-CM

## 2017-02-09 DIAGNOSIS — N189 Chronic kidney disease, unspecified: Secondary | ICD-10-CM

## 2017-02-09 DIAGNOSIS — Z0000 Encounter for general adult medical examination without abnormal findings: Secondary | ICD-10-CM

## 2017-02-09 LAB — TSH: TSH: 3.14 u[IU]/mL (ref 0.35–4.50)

## 2017-02-09 LAB — COMPREHENSIVE METABOLIC PANEL
ALBUMIN: 4.4 g/dL (ref 3.5–5.2)
ALK PHOS: 42 U/L (ref 39–117)
ALT: 21 U/L (ref 0–53)
AST: 19 U/L (ref 0–37)
BUN: 25 mg/dL — ABNORMAL HIGH (ref 6–23)
CO2: 28 mEq/L (ref 19–32)
Calcium: 9.3 mg/dL (ref 8.4–10.5)
Chloride: 101 mEq/L (ref 96–112)
Creatinine, Ser: 1.28 mg/dL (ref 0.40–1.50)
GFR: 57.89 mL/min — AB (ref >60.00)
Glucose, Bld: 93 mg/dL (ref 70–99)
POTASSIUM: 4.5 meq/L (ref 3.5–5.1)
Sodium: 137 mEq/L (ref 135–145)
TOTAL PROTEIN: 7.1 g/dL (ref 6.0–8.3)
Total Bilirubin: 0.8 mg/dL (ref 0.2–1.2)

## 2017-02-09 LAB — CBC WITH DIFFERENTIAL/PLATELET
Basophils Absolute: 0 10*3/uL (ref 0.0–0.1)
Basophils Relative: 0.3 % (ref 0.0–3.0)
EOS PCT: 2.9 % (ref 0.0–5.0)
Eosinophils Absolute: 0.2 10*3/uL (ref 0.0–0.7)
HCT: 49 % (ref 39.0–52.0)
HEMOGLOBIN: 16.2 g/dL (ref 13.0–17.0)
Lymphocytes Relative: 16.8 % (ref 12.0–46.0)
Lymphs Abs: 1.1 10*3/uL (ref 0.7–4.0)
MCHC: 33.1 g/dL (ref 30.0–36.0)
MCV: 96.9 fl (ref 78.0–100.0)
MONOS PCT: 10.4 % (ref 3.0–12.0)
Monocytes Absolute: 0.7 10*3/uL (ref 0.1–1.0)
Neutro Abs: 4.8 10*3/uL (ref 1.4–7.7)
Neutrophils Relative %: 69.6 % (ref 43.0–77.0)
Platelets: 203 10*3/uL (ref 150.0–400.0)
RBC: 5.06 Mil/uL (ref 4.22–5.81)
RDW: 14.6 % (ref 11.5–15.5)
WBC: 6.8 10*3/uL (ref 4.0–10.5)

## 2017-02-09 LAB — LIPID PANEL
Cholesterol: 160 mg/dL (ref 0–200)
HDL: 40.6 mg/dL (ref >39.00)
LDL Cholesterol: 96 mg/dL (ref 0–99)
NonHDL: 119.54
TRIGLYCERIDES: 116 mg/dL (ref 0.0–149.0)
Total CHOL/HDL Ratio: 4
VLDL: 23.2 mg/dL (ref 0.0–40.0)

## 2017-02-09 LAB — HEMOGLOBIN A1C: HEMOGLOBIN A1C: 5.8 % (ref 4.6–6.5)

## 2017-02-09 MED ORDER — PRAVASTATIN SODIUM 40 MG PO TABS: 40.0000 mg | ORAL_TABLET | Freq: Every day | ORAL | 3 refills | Status: DC

## 2017-02-09 NOTE — Assessment & Plan Note (Signed)
Check lipid panel  Continue daily statin Regular exercise and healthy diet encouraged  

## 2017-02-09 NOTE — Assessment & Plan Note (Addendum)
cmp Increase water Avoid nsaids

## 2017-02-09 NOTE — Assessment & Plan Note (Signed)
Check a1c Low sugar / carb diet Stressed regular exercise   

## 2017-02-10 ENCOUNTER — Encounter: Payer: Self-pay | Admitting: Internal Medicine

## 2017-10-11 ENCOUNTER — Encounter: Payer: Self-pay | Admitting: Internal Medicine

## 2017-11-13 ENCOUNTER — Encounter: Payer: Self-pay | Admitting: Internal Medicine

## 2018-02-10 NOTE — Progress Notes (Signed)
Subjective:    Patient ID: Miguel Joseph, male    DOB: 1940-12-16, 78 y.o.   MRN: [id number]  HPI Here for medicare wellness exam.   I have personally reviewed and have noted 1.The patient's medical and social history 2.Their use of alcohol, tobacco or illicit drugs 3.Their current medications and supplements 4.The patient's functional ability including ADL's, fall risks, home                 safety risk and hearing or visual impairment. 5.Diet and physical activities 6.Evidence for depression or mood disorders 7.Care team reviewed  -   Derm - Dr Ubaldo Glassing, [place] - Dr Katy Fitch   Right fifth finger DIP joint is becoming deformed and hurts at times.  He assumes it is arthritis.   After standing longer periods he feels unsteady and will need to hold on to something.    After standing or sitting for a period of time and then going down the stairs he feels unstaedy - his feet or below his knees feels disconnected - they feel numb/tingly.  He has to hang on.  It improves once he is walking.  He denies the sensation when walking long periods of time.  He continues to have intermittent, lower back pain with certain activities.   Are there smokers in your home (other than you)? No  Risk Factors Exercise: regularly at gym - elliptical, weights, yard work Dietary issues discussed: balanced, healthy overall - avoid fried food and junk food.  He knows he needs to drink more water, but has been unsuccessful in his attempts to do so  Vitamin and supplement use:  MVI daily  Opiod use:  N/A    Cardiac risk factors: advanced age, hyperlipidemia  Depression Screen  Have you felt down, depressed or hopeless? No  Have you felt little interest or pleasure in doing things?  No  Activities of Daily Living In your present state of health, do you have any difficulty performing the following activities?:  Driving? No Managing money?   No Feeding yourself? No Getting from bed to chair? No Climbing a flight of stairs? No Preparing food and eating?: No Bathing or showering? No Getting dressed: No Getting to/using the toilet? No Moving around from place to place: No In the past year have you fallen or had a near fall?: No    Do you have more than one partner?  No   Hearing Difficulties: yes - wears intermittent - when in groups Do you often ask people to speak up or repeat themselves? yes Do you experience ringing or noises in your ears? No Do you have difficulty understanding soft or whispered voices? yes Vision:              Any change in vision:   no             Up to date with eye exam:  yes  Memory:  Do you feel that you have a problem with memory? No - doing ok - forgetting things on occasion - feels it is normal.  Not getting lost when driving.  Do you often misplace items? No  Do you feel safe at home?  Yes  Cognitive Testing  Alert, Orientated? Yes  Normal Appearance? Yes  Recall of three objects?  Yes  Can perform simple calculations? Yes  Displays appropriate judgment? Yes  Can read the correct time from a watch face? Yes   Advanced Directives have been discussed with the  patient? Yes - in place     Medications and allergies reviewed with patient and updated if appropriate.  Patient Active Problem List   Diagnosis Date Noted  . Numbness and tingling of both legs 02/12/2018  . CKD (chronic kidney disease) 02/08/2017  . 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 Lumbar radiculopathy 02/09/2016  . Prediabetes 06/29/2015  . Diverticulosis of colon without hemorrhage 12/23/2014  . NONSPECIFIC ABNORMAL ELECTROCARDIOGRAM 05/05/2009  . Hyperlipidemia 04/15/2008    Current Outpatient Medications on File Prior to Visit  Medication Sig Dispense Refill  . aspirin EC 81 MG tablet Take 81 mg by mouth daily.    . Multiple Vitamins-Minerals (ANTIOXIDANT FORMULA SG) capsule Take 1  capsule by mouth daily.     No current facility-administered medications on file prior to visit.     Past Medical History:  Diagnosis Date  . Anal fissure   . Diverticulosis   . E. coli UTI (urinary tract infection) 12/07/2012   Rx: Cipro  . Hyperlipidemia     Past Surgical History:  Procedure Laterality Date  . APPENDECTOMY    . COLONOSCOPY  2001 &;2007;2016   internal hemorrhoids; Dr Fuller Plan  . TONSILLECTOMY AND ADENOIDECTOMY    . WISDOM TOOTH EXTRACTION      Social History   Socioeconomic History  . Marital status: Single    Spouse name: Not on file  . Number of children: 3  . Years of education: Not on file  . Highest education level: Not on file  Occupational History  . Occupation: retired Forensic psychologist  Social Needs  . Financial resource strain: Not on file  . Food insecurity:    Worry: Not on file    Inability: Not on file  . Transportation needs:    Medical: Not on file    Non-medical: Not on file  Tobacco Use  . Smoking status: Never Smoker  . Smokeless tobacco: Never Used  Substance and Sexual Activity  . Alcohol use: Yes    Alcohol/week: 4.0 standard drinks    Types: 4 Glasses of wine per week  . Drug use: Yes  . Sexual activity: Not on file  Lifestyle  . Physical activity:    Days per week: Not on file    Minutes per session: Not on file  . Stress: Not on file  Relationships  . Social connections:    Talks on phone: Not on file    Gets together: Not on file    Attends religious service: Not on file    Active member of club or organization: Not on file    Attends meetings of clubs or organizations: Not on file    Relationship status: Not on file  Other Topics Concern  . Not on file  Social History Narrative  . Not on file    Family History  Problem Relation Age of Onset  . Breast cancer Mother   . Ovarian cancer Mother   . Testicular cancer Father   . Breast cancer Sister   . Pancreatic cancer Paternal Uncle         X2   . Heart block  Paternal Aunt        pacer  . Colon polyps Son 67       tubular adenoma  . Diabetes Neg Hx   . Stroke Neg Hx   . Heart disease Neg Hx   . Hypertension Neg Hx     Review of Systems  Constitutional: Negative for  chills and fever.  Eyes: Negative for visual disturbance.  Respiratory: Negative for cough, shortness of breath and wheezing.   Cardiovascular: Negative for chest pain, palpitations and leg swelling.  Gastrointestinal: Negative for abdominal pain, blood in stool, constipation, diarrhea and nausea.       No gerd  Genitourinary: Negative for difficulty urinating, dysuria and hematuria.  Musculoskeletal: Positive for arthralgias (right fifth finger DIP) and back pain (intermittent, with certain actvities).  Skin: Negative for color change and rash.  Neurological: Positive for numbness. Negative for dizziness, tremors, weakness, light-headedness and headaches.  Psychiatric/Behavioral: Negative for dysphoric mood. The patient is not nervous/anxious.        Objective:   Vitals:   02/12/18 0758  BP: 122/62  Pulse: 70  Resp: 16  Temp: 98 F (36.7 C)  SpO2: 93%   Filed Weights   02/12/18 0758  Weight: 167 lb 12.8 oz (76.1 kg)   Body mass index is 24.78 kg/m.  Wt Readings from Last 3 Encounters:  02/12/18 167 lb 12.8 oz (76.1 kg)  02/09/17 169 lb (76.7 kg)  02/09/16 167 lb (75.8 kg)     Physical Exam Constitutional: He appears well-developed and well-nourished. No distress.  HENT:  Head: Normocephalic and atraumatic.  Right Ear: External ear normal.  Left Ear: External ear normal.  Mouth/Throat: Oropharynx is clear.  Dry mucous membranes.  Normal ear canals and TM b/l  Eyes: Conjunctivae and EOM are normal.  Neck: Neck supple. No tracheal deviation present. No thyromegaly present.  No carotid bruit  Cardiovascular: Normal rate, regular rhythm, normal heart sounds and intact distal pulses.   No murmur heard. Pulmonary/Chest: Effort normal and breath sounds  normal. No respiratory distress. He has no wheezes. He has no rales.  Abdominal: Soft. He exhibits no distension. There is no tenderness.  Genitourinary: deferred  Musculoskeletal: He exhibits no edema.  Lymphadenopathy:   He has no cervical adenopathy.  Skin: Skin is warm and dry. He is not diaphoretic.  Psychiatric: He has a normal mood and affect. His behavior is normal.         Assessment & Plan:   Wellness Exam: Immunizations   Discussed shingrix, others up to date Colonoscopy    No longer needed due to age Eye exams  Up to date  Hearing loss    Yes - wears a hearing aid intermittently - when in crowds Memory concerns/difficulties Independent of ADLs Stressed the importance of regular exercise   Patient received copy of preventative screening tests/immunizations recommended for the next 5-10 years.    Physical exam: Screening blood work ordered Immunizations   Discussed shingrix, others up to date Colonoscopy    No longer needed due to age Eye exams   Up to date  EKG     Done 2013 Exercise  Regular - gym and yard work Weight    Normal BMI Skin   No concerns - sees derm annually Substance abuse   none  See Problem List for Assessment and Plan of chronic medical problems.    FU annually

## 2018-02-10 NOTE — Patient Instructions (Addendum)
Miguel Joseph , Thank you for taking time to come for your Medicare Wellness Visit. I appreciate your ongoing commitment to your health goals. Please review the following plan we discussed and let me know if I can assist you in the future.   These are the goals we discussed: Goals   None     This is a list of the screening recommended for you and due dates:  Health Maintenance  Topic Date Due  . Tetanus Vaccine  10/22/2023  . Flu Shot  Completed  . Pneumonia vaccines  Completed    Tests ordered today. Your results will be released to [facility] (or called to you) after review, usually within 72hours after test completion. If any changes need to be made, you will be notified at that same time.  All other Health Maintenance issues reviewed.   All recommended immunizations and age-appropriate screenings are up-to-date or discussed.  No immunizations administered today.   Medications reviewed and updated.  Changes include :   none  Your prescription(s) have been submitted to your pharmacy. Please take as directed and contact our office if you believe you are having problem(s) with the medication(s).  A referral was ordered for neurology.    Please followup in one year    Health Maintenance After Age 53 After age 6, you are at a higher risk for certain long-term diseases and infections as well as injuries from falls. Falls are a major cause of broken bones and head injuries in people who are older than age 30. Getting regular preventive care can help to keep you healthy and well. Preventive care includes getting regular testing and making lifestyle changes as recommended by your health care provider. Talk with your health care provider about:  Which screenings and tests you should have. A screening is a test that checks for a disease when you have no symptoms.  A diet and exercise plan that is right for you. What should I know about screenings and tests to prevent falls? Screening and  testing are the best ways to find a health problem early. Early diagnosis and treatment give you the best chance of managing medical conditions that are common after age 35. Certain conditions and lifestyle choices may make you more likely to have a fall. Your health care provider may recommend:  Regular vision checks. Poor vision and conditions such as cataracts can make you more likely to have a fall. If you wear glasses, make sure to get your prescription updated if your vision changes.  Medicine review. Work with your health care provider to regularly review all of the medicines you are taking, including over-the-counter medicines. Ask your health care provider about any side effects that may make you more likely to have a fall. Tell your health care provider if any medicines that you take make you feel dizzy or sleepy.  Osteoporosis screening. Osteoporosis is a condition that causes the bones to get weaker. This can make the bones weak and cause them to break more easily.  Blood pressure screening. Blood pressure changes and medicines to control blood pressure can make you feel dizzy.  Strength and balance checks. Your health care provider may recommend certain tests to check your strength and balance while standing, walking, or changing positions.  Foot health exam. Foot pain and numbness, as well as not wearing proper footwear, can make you more likely to have a fall.  Depression screening. You may be more likely to have a fall if you have  a fear of falling, feel emotionally low, or feel unable to do activities that you used to do.  Alcohol use screening. Using too much alcohol can affect your balance and may make you more likely to have a fall. What actions can I take to lower my risk of falls? General instructions  Talk with your health care provider about your risks for falling. Tell your health care provider if: ? You fall. Be sure to tell your health care provider about all falls,  even ones that seem minor. ? You feel dizzy, sleepy, or off-balance.  Take over-the-counter and prescription medicines only as told by your health care provider. These include any supplements.  Eat a healthy diet and maintain a healthy weight. A healthy diet includes low-fat dairy products, low-fat (lean) meats, and fiber from whole grains, beans, and lots of fruits and vegetables. Home safety  Remove any tripping hazards, such as rugs, cords, and clutter.  Install safety equipment such as grab bars in bathrooms and safety rails on stairs.  Keep rooms and walkways well-lit. Activity   Follow a regular exercise program to stay fit. This will help you maintain your balance. Ask your health care provider what types of exercise are appropriate for you.  If you need a cane or walker, use it as recommended by your health care provider.  Wear supportive shoes that have nonskid soles. Lifestyle  Do not drink alcohol if your health care provider tells you not to drink.  If you drink alcohol, limit how much you have: ? 0-1 drink a day for women. ? 0-2 drinks a day for men.  Be aware of how much alcohol is in your drink. In the [place] one drink equals one typical bottle of beer (12 oz), one-half glass of wine (5 oz), or one shot of hard liquor (1 oz).  Do not use any products that contain nicotine or tobacco, such as cigarettes and e-cigarettes. If you need help quitting, ask your health care provider. Summary  Having a healthy lifestyle and getting preventive care can help to protect your health and wellness after age 36.  Screening and testing are the best way to find a health problem early and help you avoid having a fall. Early diagnosis and treatment give you the best chance for managing medical conditions that are more common for people who are older than age 74.  Falls are a major cause of broken bones and head injuries in people who are older than age 29. Take precautions to  prevent a fall at home.  Work with your health care provider to learn what changes you can make to improve your health and wellness and to prevent falls. This information is not intended to replace advice given to you by your health care provider. Make sure you discuss any questions you have with your health care provider. Document Released: 12/07/2016 Document Revised: 12/07/2016 Document Reviewed: 12/07/2016 Elsevier Interactive Patient Education  2019 [employer].

## 2018-02-12 ENCOUNTER — Ambulatory Visit (INDEPENDENT_AMBULATORY_CARE_PROVIDER_SITE_OTHER): Payer: Medicare Other | Admitting: Internal Medicine

## 2018-02-12 ENCOUNTER — Encounter: Payer: Self-pay | Admitting: Internal Medicine

## 2018-02-12 ENCOUNTER — Ambulatory Visit (INDEPENDENT_AMBULATORY_CARE_PROVIDER_SITE_OTHER)
Admission: RE | Admit: 2018-02-12 | Discharge: 2018-02-12 | Disposition: A | Discharge status: Home / Self Care | Payer: Medicare Other | Source: Ambulatory Visit | Attending: Internal Medicine | Admitting: Internal Medicine

## 2018-02-12 ENCOUNTER — Other Ambulatory Visit (INDEPENDENT_AMBULATORY_CARE_PROVIDER_SITE_OTHER): Payer: Medicare Other

## 2018-02-12 VITALS — BP 122/62 | HR 70 | Temp 98.0°F | Resp 16 | Ht 69.0 in | Wt 167.8 lb

## 2018-02-12 DIAGNOSIS — R7303 Prediabetes: Secondary | ICD-10-CM

## 2018-02-12 DIAGNOSIS — R202 Paresthesia of skin: Secondary | ICD-10-CM

## 2018-02-12 DIAGNOSIS — E7849 Other hyperlipidemia: Secondary | ICD-10-CM | POA: Diagnosis not present

## 2018-02-12 DIAGNOSIS — R200 Anesthesia of skin: Secondary | ICD-10-CM

## 2018-02-12 DIAGNOSIS — Z0000 Encounter for general adult medical examination without abnormal findings: Secondary | ICD-10-CM

## 2018-02-12 DIAGNOSIS — M545 Low back pain, unspecified: Secondary | ICD-10-CM

## 2018-02-12 DIAGNOSIS — N189 Chronic kidney disease, unspecified: Secondary | ICD-10-CM

## 2018-02-12 LAB — COMPREHENSIVE METABOLIC PANEL
ALT: 21 U/L (ref 0–53)
AST: 21 U/L (ref 0–37)
Albumin: 4.5 g/dL (ref 3.5–5.2)
Alkaline Phosphatase: 42 U/L (ref 39–117)
BUN: 28 mg/dL — ABNORMAL HIGH (ref 6–23)
CO2: 27 mEq/L (ref 19–32)
Calcium: 9.8 mg/dL (ref 8.4–10.5)
Chloride: 102 mEq/L (ref 96–112)
Creatinine, Ser: 1.34 mg/dL (ref 0.40–1.50)
GFR: 54.77 mL/min — ABNORMAL LOW (ref >60.00)
Glucose, Bld: 94 mg/dL (ref 70–99)
Potassium: 4.3 mEq/L (ref 3.5–5.1)
Sodium: 137 mEq/L (ref 135–145)
Total Bilirubin: 0.7 mg/dL (ref 0.2–1.2)
Total Protein: 7.2 g/dL (ref 6.0–8.3)

## 2018-02-12 LAB — CBC WITH DIFFERENTIAL/PLATELET
BASOS ABS: 0 10*3/uL (ref 0.0–0.1)
Basophils Relative: 0.5 % (ref 0.0–3.0)
Eosinophils Absolute: 0.2 10*3/uL (ref 0.0–0.7)
Eosinophils Relative: 2.5 % (ref 0.0–5.0)
HCT: 49.1 % (ref 39.0–52.0)
Hemoglobin: 16.7 g/dL (ref 13.0–17.0)
Lymphocytes Relative: 15.9 % (ref 12.0–46.0)
Lymphs Abs: 1.2 10*3/uL (ref 0.7–4.0)
MCHC: 34.1 g/dL (ref 30.0–36.0)
MCV: 94.9 fl (ref 78.0–100.0)
Monocytes Absolute: 0.9 10*3/uL (ref 0.1–1.0)
Monocytes Relative: 11.4 % (ref 3.0–12.0)
Neutro Abs: 5.4 10*3/uL (ref 1.4–7.7)
Neutrophils Relative %: 69.7 % (ref 43.0–77.0)
Platelets: 213 10*3/uL (ref 150.0–400.0)
RBC: 5.17 Mil/uL (ref 4.22–5.81)
RDW: 14.9 % (ref 11.5–15.5)
WBC: 7.7 10*3/uL (ref 4.0–10.5)

## 2018-02-12 LAB — LIPID PANEL
Cholesterol: 174 mg/dL (ref 0–200)
HDL: 45.3 mg/dL (ref >39.00)
LDL Cholesterol: 108 mg/dL — ABNORMAL HIGH (ref 0–99)
NonHDL: 128.71
TRIGLYCERIDES: 103 mg/dL (ref 0.0–149.0)
Total CHOL/HDL Ratio: 4
VLDL: 20.6 mg/dL (ref 0.0–40.0)

## 2018-02-12 LAB — VITAMIN B12: Vitamin B-12: 675 pg/mL (ref 211–911)

## 2018-02-12 LAB — HEMOGLOBIN A1C: Hgb A1c MFr Bld: 5.7 % (ref 4.6–6.5)

## 2018-02-12 LAB — TSH: TSH: 2.48 u[IU]/mL (ref 0.35–4.50)

## 2018-02-12 MED ORDER — PRAVASTATIN SODIUM 40 MG PO TABS: 40.0000 mg | ORAL_TABLET | Freq: Every day | ORAL | 3 refills | Status: DC

## 2018-02-12 NOTE — Assessment & Plan Note (Signed)
Chronic intermittent lower back pain depending on certain activities CT of the abdomen and pelvis in the past showed arthritis and some scoliosis Given his symptoms in his lower legs consistent with possible neuropathy will check lumbar spine x-ray

## 2018-02-12 NOTE — Assessment & Plan Note (Signed)
Check a1c Low sugar / carb diet Stressed regular exercise   

## 2018-02-12 NOTE — Assessment & Plan Note (Signed)
Poor balance associated with numbness/tingling below knees bilaterally-occurs after standing or sitting for prolonged period of time and then going down stairs Does not occur with walking for long periods of time ?  Neuropathy Lumbar x-ray given significant arthritis and scoliosis seen on prior imaging Check B12 level Will refer to neurology

## 2018-02-12 NOTE — Assessment & Plan Note (Signed)
Check lipid panel  Continue daily statin Regular exercise and healthy diet encouraged  

## 2018-02-12 NOTE — Assessment & Plan Note (Signed)
Mild kidney disease Does not take NSAIDs on a regular basis Does not drink a lot of water and knows he needs to increase how much he is drinking His mucous membranes are dry on exam and his BUN has been consistently elevated in the past-stressed increasing water CMP

## 2018-02-13 ENCOUNTER — Encounter: Payer: Self-pay | Admitting: Internal Medicine

## 2018-02-14 ENCOUNTER — Encounter: Payer: Self-pay | Admitting: Neurology

## 2018-04-13 NOTE — Progress Notes (Signed)
[facility] Clinic Note - Initial Visit   Date: 04/16/18  TOU HAYNER MRN: [id number] DOB: 23-Feb-1940   Dear Dr. Quay Burow:  Thank you for your kind referral of Miguel Joseph for consultation of imbalance and paresthesias. Although his history is well known to you, please allow [place] to reiterate it for the purpose of our medical record. The patient was accompanied to the clinic by self.    History of Present Illness: Miguel Joseph is a 78 y.o. right-handed Caucasian male with hyperlipidemia presenting for evaluation of imbalance and bilateral leg paresthesias.   Starting around 2019, he began having discomfort of the legs which is described as a numbness, such that he cannot tell where his feet or legs are at.  This sensation is always triggered by standing or sitting > 10 minutes and much more noticeable when he is descending stairs, reporting that he does not aware of where his feet are.  He usually prefers to lean on things when he does not have stand.  He endorses chronic low low back pain. He does not have radicular leg pain or leg weakness.  He does not have weakness and walks unassisted.  No falls.  He does complain of intermittent leg cramps, but feels this may be due to dehydration.  Out-side paper records, electronic medical record, and images have been reviewed where available and summarized as:  Lab Results  Component Value Date   HGBA1C 5.7 02/12/2018   Lab Results  Component Value Date   TKWIOXBD53 299 02/12/2018   Lab Results  Component Value Date   TSH 2.48 02/12/2018     Past Medical History:  Diagnosis Date  . Anal fissure   . Diverticulosis   . E. coli UTI (urinary tract infection) 12/07/2012   Rx: Cipro  . Hyperlipidemia     Past Surgical History:  Procedure Laterality Date  . APPENDECTOMY    . COLONOSCOPY  2001 &;2007;2016   internal hemorrhoids; Dr Fuller Plan  . TONSILLECTOMY AND ADENOIDECTOMY    . WISDOM TOOTH  EXTRACTION       Medications:  Outpatient Encounter Medications as of 04/16/2018  Medication Sig  . aspirin EC 81 MG tablet Take 81 mg by mouth daily.  . Multiple Vitamins-Minerals (ANTIOXIDANT FORMULA SG) capsule Take 1 capsule by mouth daily.  . pravastatin (PRAVACHOL) 40 MG tablet Take 1 tablet (40 mg total) by mouth daily.   No facility-administered encounter medications on file as of 04/16/2018.     Allergies: No Known Allergies  Family History: Family History  Problem Relation Age of Onset  . Breast cancer Mother   . Ovarian cancer Mother   . Testicular cancer Father   . Breast cancer Sister   . Pancreatic cancer Paternal Uncle         X2   . Heart block Paternal Aunt        pacer  . Colon polyps Son 55       tubular adenoma  . Diabetes Neg Hx   . Stroke Neg Hx   . Heart disease Neg Hx   . Hypertension Neg Hx     Social History: Social History   Tobacco Use  . Smoking status: Never Smoker  . Smokeless tobacco: Never Used  Substance Use Topics  . Alcohol use: Yes    Alcohol/week: 4.0 standard drinks    Types: 4 Glasses of wine per week    Comment: He drinks 1-2 glasses per week  . Drug  use: Yes   Social History   Social History Narrative   He is retired Forensic psychologist in 2010.   He lives with wife.  He has three grown children.    Review of Systems:  CONSTITUTIONAL: No fevers, chills, night sweats, or weight loss.   EYES: No visual changes or eye pain ENT: No hearing changes.  No history of nose bleeds.   RESPIRATORY: No cough, wheezing and shortness of breath.   CARDIOVASCULAR: Negative for chest pain, and palpitations.   GI: Negative for abdominal discomfort, blood in stools or black stools.  No recent change in bowel habits.   GU:  No history of incontinence.   MUSCLOSKELETAL: +history of joint pain or swelling.  No myalgias.   SKIN: Negative for lesions, rash, and itching.   HEMATOLOGY/ONCOLOGY: Negative for prolonged bleeding, bruising easily, and  swollen nodes.  No history of cancer.   ENDOCRINE: Negative for cold or heat intolerance, polydipsia or goiter.   PSYCH:  No depression or anxiety symptoms.   NEURO: As Above.   Vital Signs:  BP 108/70   Pulse 75   Ht 5\' 8"  (1.727 m)   Wt 167 lb 8 oz (76 kg)   SpO2 94%   BMI 25.47 kg/m    General Medical Exam:   General:  Well appearing, comfortable.   Eyes/ENT: see cranial nerve examination.   Neck:   No carotid bruits. Respiratory:  Clear to auscultation, good air entry bilaterally.   Cardiac:  Regular rate and rhythm, no murmur.   Extremities:  No deformities, edema, or skin discoloration.  Skin:  No rashes or lesions.  Neurological Exam: MENTAL STATUS including orientation to time, place, person, recent and remote memory, attention span and concentration, language, and fund of knowledge is normal.  Speech is not dysarthric.  CRANIAL NERVES: II:  No visual field defects.  Unremarkable fundi.   III-IV-VI: Pupils equal round and reactive to light.  Normal conjugate, extra-ocular eye movements in all directions of gaze.  No nystagmus.  No ptosis.   V:  Normal facial sensation.    VII:  Normal facial symmetry and movements.   VIII:  Normal hearing and vestibular function.   IX-X:  Normal palatal movement.   XI:  Normal shoulder shrug and head rotation.   XII:  Normal tongue strength and range of motion, no deviation or fasciculation.  MOTOR:  No atrophy, fasciculations or abnormal movements.  No pronator drift.   Upper Extremity:  Right  Left  Deltoid  5/5   5/5   Biceps  5/5   5/5   Triceps  5/5   5/5   Infraspinatus 5/5  5/5  Medial pectoralis 5/5  5/5  Wrist extensors  5/5   5/5   Wrist flexors  5/5   5/5   Finger extensors  5/5   5/5   Finger flexors  5/5   5/5   Dorsal interossei  5/5   5/5   Abductor pollicis  5/5   5/5   Tone (Ashworth scale)  0  0   Lower Extremity:  Right  Left  Hip flexors  5/5   5/5   Hip extensors  5/5   5/5   Adductor 5/5  5/5    Abductor 5/5  5/5  Knee flexors  5/5   5/5   Knee extensors  5/5   5/5   Dorsiflexors  5/5   5/5   Plantarflexors  5/5   5/5   Toe extensors  5/5   5/5   Toe flexors  5/5   5/5   Tone (Ashworth scale)  0+  0+   MSRs:  Right                                                                 Left brachioradialis 2+  brachioradialis 2+  biceps 2+  biceps 2+  triceps 2+  triceps 2+  patellar 3+  patellar 3+  ankle jerk 2+  ankle jerk 2+  Hoffman no  Hoffman no  plantar response down  plantar response down   SENSORY:  Diminished vibration at the great toe bilaterally, otherwise normal and symmetric perception of light touch, pinprick,  and proprioception.  Romberg's sign absent.   COORDINATION/GAIT: Normal finger-to- nose-finger.  Intact rapid alternating movements bilaterally.  Gait narrow based and stable. Tandem and stressed gait intact.    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  MRI lumbar spine without contrast will be ordered to evaluate for structural pathology.  Consider PT based on results of imaging.    Thank you for allowing me to participate in patient's care.  If I can answer any additional questions, I would be pleased to do so.    Sincerely,    Marijke Guadiana K. Posey Pronto, DO

## 2018-04-16 ENCOUNTER — Encounter: Payer: Self-pay | Admitting: Neurology

## 2018-04-16 ENCOUNTER — Ambulatory Visit: Payer: Medicare Other | Admitting: Neurology

## 2018-04-16 VITALS — BP 108/70 | HR 75 | Ht 68.0 in | Wt 167.5 lb

## 2018-04-16 DIAGNOSIS — M48062 Spinal stenosis, lumbar region with neurogenic claudication: Secondary | ICD-10-CM | POA: Diagnosis not present

## 2018-04-16 NOTE — Patient Instructions (Signed)
MRI lumbar spine without contrast    

## 2018-05-14 ENCOUNTER — Other Ambulatory Visit: Payer: Medicare Other

## 2018-06-29 ENCOUNTER — Ambulatory Visit
Admission: RE | Admit: 2018-06-29 | Discharge: 2018-06-29 | Disposition: A | Discharge status: Home / Self Care | Payer: Medicare Other | Source: Ambulatory Visit | Attending: Neurology | Admitting: Neurology

## 2018-06-29 ENCOUNTER — Other Ambulatory Visit: Payer: Self-pay

## 2018-06-29 DIAGNOSIS — M48062 Spinal stenosis, lumbar region with neurogenic claudication: Secondary | ICD-10-CM

## 2018-07-04 ENCOUNTER — Encounter: Payer: Self-pay | Admitting: Neurology

## 2018-07-04 ENCOUNTER — Other Ambulatory Visit: Payer: Self-pay

## 2018-07-04 ENCOUNTER — Telehealth (INDEPENDENT_AMBULATORY_CARE_PROVIDER_SITE_OTHER): Payer: Medicare Other | Admitting: Neurology

## 2018-07-04 ENCOUNTER — Other Ambulatory Visit: Payer: Self-pay | Admitting: Neurology

## 2018-07-04 VITALS — Ht 68.0 in | Wt 160.0 lb

## 2018-07-04 DIAGNOSIS — M5416 Radiculopathy, lumbar region: Secondary | ICD-10-CM

## 2018-07-04 DIAGNOSIS — M48062 Spinal stenosis, lumbar region with neurogenic claudication: Secondary | ICD-10-CM | POA: Diagnosis not present

## 2018-07-04 NOTE — Progress Notes (Signed)
Ordered [facility] to [facility]--awaiting signature and pt have an appt 10/28/18

## 2018-07-04 NOTE — Progress Notes (Signed)
    Virtual Visit via Telephone Note The purpose of this virtual visit is to provide medical care while limiting exposure to the novel coronavirus.    Consent was obtained for phone visit:  Yes.   Answered questions that patient had about telehealth interaction:  Yes.   I discussed the limitations, risks, security and privacy concerns of performing an evaluation and management service by telephone. I also discussed with the patient that there may be a patient responsible charge related to this service. The patient expressed understanding and agreed to proceed.  Pt location: Home Physician Location: office Name of referring provider:  Binnie Rail, MD I connected with .Kathie Rhodes Sweatman at patients initiation/request on 07/04/2018 at  9:10 AM EDT by telephone and verified that I am speaking with the correct person using two identifiers.  Pt MRN:  [id number] Pt DOB:  1940-09-11   History of Present Illness: This is a 78 year old man here to discuss results of his MRI lumbar spine which was ordered for exertional leg numbness and weakness, concerning for neurogenic claudication.  MRI lumbar spine shows multilevel lumbar spondylosis with spinal canal and foraminal stenosis.  Clinically, he reports doing relatively well with less frequent spells of exertional leg numbness.  He attributes this to being less active at social gatherings and going to the court house less frequently.  He continues to stay active in his yard and has some low back pain following this, however nothing which is debilitating.  He denies any falls or leg weakness.  No bowel or bladder incontinence.  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 Multilevel spinal and foraminal stenosis as above. Severe subarticular and foraminal stenosis on the right at L4-5 and L5-S1. Moderate to severe subarticular stenosis on the left at L3-4.   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  I discussed the results of his MRI lumbar spine in detail with patient, which shows moderate to severe degenerative changes.  Management options were discussed and we will start with physical therapy for low back stretching and strengthening first.  He would prefer to wait on doing physical therapy until [place] has settled down. If he develops new leg weakness, paresthesias, or progressive low back pain, the next step would be referral to neurosurgery.   Follow Up Instructions:   I discussed the assessment and treatment plan with the patient. The patient was provided an opportunity to ask questions and all were answered. The patient agreed with the plan and demonstrated an understanding of the instructions.   The patient was advised to call back or seek an in-person evaluation if the symptoms worsen or if the condition fails to improve as anticipated.    Total Time spent in visit with the patient was:  23 min, of which 100% of the time was spent in counseling and/or coordinating care.   Pt understands and agrees with the plan of care outlined.     Alda Berthold, DO

## 2018-08-13 ENCOUNTER — Encounter: Payer: Self-pay | Admitting: Internal Medicine

## 2018-08-14 ENCOUNTER — Encounter: Payer: Self-pay | Admitting: Internal Medicine

## 2018-08-14 ENCOUNTER — Other Ambulatory Visit (INDEPENDENT_AMBULATORY_CARE_PROVIDER_SITE_OTHER): Payer: Medicare Other

## 2018-08-14 ENCOUNTER — Ambulatory Visit (INDEPENDENT_AMBULATORY_CARE_PROVIDER_SITE_OTHER): Payer: Medicare Other | Admitting: Internal Medicine

## 2018-08-14 DIAGNOSIS — Z125 Encounter for screening for malignant neoplasm of prostate: Secondary | ICD-10-CM | POA: Diagnosis not present

## 2018-08-14 DIAGNOSIS — R361 Hematospermia: Secondary | ICD-10-CM | POA: Insufficient documentation

## 2018-08-14 LAB — COMPREHENSIVE METABOLIC PANEL
ALT: 23 U/L (ref 0–53)
AST: 21 U/L (ref 0–37)
Albumin: 4.6 g/dL (ref 3.5–5.2)
Alkaline Phosphatase: 60 U/L (ref 39–117)
BUN: 25 mg/dL — ABNORMAL HIGH (ref 6–23)
CO2: 29 mEq/L (ref 19–32)
Calcium: 9.2 mg/dL (ref 8.4–10.5)
Chloride: 102 mEq/L (ref 96–112)
Creatinine, Ser: 1.44 mg/dL (ref 0.40–1.50)
GFR: 47.36 mL/min — ABNORMAL LOW (ref >60.00)
Glucose, Bld: 112 mg/dL — ABNORMAL HIGH (ref 70–99)
Potassium: 4.3 mEq/L (ref 3.5–5.1)
Sodium: 139 mEq/L (ref 135–145)
Total Bilirubin: 0.4 mg/dL (ref 0.2–1.2)
Total Protein: 7 g/dL (ref 6.0–8.3)

## 2018-08-14 LAB — URINALYSIS, ROUTINE W REFLEX MICROSCOPIC
Bilirubin Urine: NEGATIVE
Hgb urine dipstick: NEGATIVE
Ketones, ur: NEGATIVE
Leukocytes,Ua: NEGATIVE
Nitrite: NEGATIVE
RBC / HPF: NONE SEEN (ref 0–?)
Specific Gravity, Urine: 1.025 (ref 1.000–1.030)
Total Protein, Urine: NEGATIVE
Urine Glucose: NEGATIVE
Urobilinogen, UA: 0.2 (ref 0.0–1.0)
pH: 6 (ref 5.0–8.0)

## 2018-08-14 LAB — PSA, MEDICARE: PSA: 10.85 ng/ml — ABNORMAL HIGH (ref 0.10–4.00)

## 2018-08-14 NOTE — Progress Notes (Signed)
Virtual Visit via Video Note  I connected with Miguel Joseph on 08/14/18 at  2:15 PM EDT by a video enabled telemedicine application and verified that I am speaking with the correct person using two identifiers.   I discussed the limitations of evaluation and management by telemedicine and the availability of in person appointments. The patient expressed understanding and agreed to proceed.  The patient is currently at home and I am in the office.    No referring provider.    History of Present Illness: This is an acute visit for blood in his semen.   Saturday night he had blood in his semen - he saw it when he was walking to the bathroom.  He had a couple of drops of blood that came out with some semen.  He did not experience any bleeding the next day.  Sunday night he masturbated and had blood again - it was still there, but a lot less - it was only with a few streaks of blood - less than the night before.    He is never experienced this before.  He denies any dysuria, increased urinary frequency, hematuria, abdominal pain, fever or chills.    Review of Systems  Constitutional: Negative for fever.  Gastrointestinal: Negative for abdominal pain and nausea.  Genitourinary: Negative for dysuria, frequency, hematuria and urgency.       No genital pain      Social History   Socioeconomic History  . Marital status: Married    Spouse name: Not on file  . Number of children: 3  . Years of education: Not on file  . Highest education level: Professional school degree (e.g., MD, DDS, DVM, JD)  Occupational History  . Occupation: retired Forensic psychologist  Social Needs  . Financial resource strain: Not on file  . Food insecurity    Worry: Not on file    Inability: Not on file  . Transportation needs    Medical: Not on file    Non-medical: Not on file  Tobacco Use  . Smoking status: Never Smoker  . Smokeless tobacco: Never Used  Substance and Sexual Activity  . Alcohol use: Yes    Alcohol/week: 4.0 standard drinks    Types: 4 Glasses of wine per week    Comment: He drinks 1-2 glasses per week  . Drug use: Yes  . Sexual activity: Not on file  Lifestyle  . Physical activity    Days per week: Not on file    Minutes per session: Not on file  . Stress: Not on file  Relationships  . Social Herbalist on phone: Not on file    Gets together: Not on file    Attends religious service: Not on file    Active member of club or organization: Not on file    Attends meetings of clubs or organizations: Not on file    Relationship status: Not on file  Other Topics Concern  . Not on file  Social History Narrative   He is retired Forensic psychologist in 2010.   He lives with wife.  He has three grown children.     Observations/Objective: Appears well in NAD   Assessment and Plan:  See Problem List for Assessment and Plan of chronic medical problems.   Follow Up Instructions:    I discussed the assessment and treatment plan with the patient. The patient was provided an opportunity to ask questions and all were answered. The patient agreed with the  plan and demonstrated an understanding of the instructions.   The patient was advised to call back or seek an in-person evaluation if the symptoms worsen or if the condition fails to improve as anticipated.    Binnie Rail, MD

## 2018-08-14 NOTE — Assessment & Plan Note (Signed)
He had two episodes of blood in his semen ?  Prostatitis, UTI, possibility of cancer Will refer to urology Will check PSA, CMP, urinalysis and urine culture to rule out infection

## 2018-08-15 LAB — URINE CULTURE
MICRO NUMBER:: 641346
Result:: NO GROWTH
SPECIMEN QUALITY:: ADEQUATE

## 2018-08-16 ENCOUNTER — Telehealth: Payer: Self-pay | Admitting: Internal Medicine

## 2018-08-16 DIAGNOSIS — N183 Chronic kidney disease, stage 3 unspecified: Secondary | ICD-10-CM

## 2018-08-16 NOTE — Telephone Encounter (Signed)
Let him know I ordered an [place] to evaluate his kidneys.  Urology will do additional testing and may or may not do additional imaging.

## 2018-08-16 NOTE — Telephone Encounter (Signed)
Pt aware of results. [place] with doing either scan recommended. Has not heard from urology.

## 2018-08-16 NOTE — Telephone Encounter (Signed)
Call him with his results.  His urine shows no infection. His kidney function is decreased and slightly lower than previous.  His psa is elevated which can be concerning for an infection despite the negative urine or possible cancer.    Has he heard from urology yet?  I would like him to have an [place] of his kidneys or a CT scan if he has not heard from them.  Looking back on a CT scan he had in 2014 his prostate was enlarged - even an enlarged but normal prostate may cause this.

## 2018-08-17 NOTE — Telephone Encounter (Signed)
Pt aware of results. Pt also asked out of curiosity why he has not had his PSA done before now. Due to not it not being done in 9 years. Just wanted to know if it had to due with his insurance.

## 2018-08-17 NOTE — Telephone Encounter (Signed)
It is recommended to stop Prostate cancer screening at age 78, so we do not routinely check psa after that.

## 2018-08-17 NOTE — Telephone Encounter (Signed)
Pt aware of response and expressed understanding.  

## 2018-08-20 ENCOUNTER — Encounter: Payer: Self-pay | Admitting: Internal Medicine

## 2018-08-23 ENCOUNTER — Other Ambulatory Visit: Payer: Self-pay

## 2018-08-23 ENCOUNTER — Ambulatory Visit
Admission: RE | Admit: 2018-08-23 | Discharge: 2018-08-23 | Disposition: A | Discharge status: Home / Self Care | Payer: Medicare Other | Source: Ambulatory Visit | Attending: Internal Medicine | Admitting: Internal Medicine

## 2018-08-23 DIAGNOSIS — N183 Chronic kidney disease, stage 3 unspecified: Secondary | ICD-10-CM

## 2018-08-24 ENCOUNTER — Encounter: Payer: Self-pay | Admitting: Internal Medicine

## 2018-08-26 ENCOUNTER — Encounter: Payer: Self-pay | Admitting: Internal Medicine

## 2018-08-26 DIAGNOSIS — N189 Chronic kidney disease, unspecified: Secondary | ICD-10-CM

## 2018-10-18 ENCOUNTER — Encounter: Payer: Self-pay | Admitting: Internal Medicine

## 2018-10-29 ENCOUNTER — Ambulatory Visit: Payer: Medicare Other | Admitting: Neurology

## 2019-02-20 ENCOUNTER — Ambulatory Visit: Payer: Medicare Other | Attending: Internal Medicine

## 2019-02-20 DIAGNOSIS — Z23 Encounter for immunization: Secondary | ICD-10-CM | POA: Insufficient documentation

## 2019-02-20 NOTE — Progress Notes (Signed)
   Covid-19 Vaccination Clinic  Name:  Miguel Joseph    MRN: [id number] DOB: 1940-12-18  02/20/2019  [name] was observed post Covid-19 immunization for 15 minutes without incidence. He was provided with Vaccine Information Sheet and instruction to access the V-Safe system.   [name] was instructed to call 911 with any severe reactions post vaccine: Marland Kitchen Difficulty breathing  . Swelling of your face and throat  . A fast heartbeat  . A bad rash all over your body  . Dizziness and weakness    Immunizations Administered    Name Date Dose VIS Date Route   Pfizer COVID-19 Vaccine 02/20/2019  9:18 AM 0.3 mL 01/18/2019 Intramuscular   Manufacturer: [employer], [employer]   Lot: [id number]   [place]: [id number]

## 2019-03-12 ENCOUNTER — Ambulatory Visit: Payer: Medicare Other | Attending: Internal Medicine

## 2019-03-12 DIAGNOSIS — Z23 Encounter for immunization: Secondary | ICD-10-CM | POA: Insufficient documentation

## 2019-03-12 NOTE — Progress Notes (Signed)
   Covid-19 Vaccination Clinic  Name:  Miguel Joseph    MRN: [id number] DOB: 26-May-1940  03/12/2019  Mr. Miguel Joseph was observed post Covid-19 immunization for 15 minutes without incidence. He was provided with Vaccine Information Sheet and instruction to access the V-Safe system.   Mr. Miguel Joseph was instructed to call 911 with any severe reactions post vaccine: Marland Kitchen Difficulty breathing  . Swelling of your face and throat  . A fast heartbeat  . A bad rash all over your body  . Dizziness and weakness    Immunizations Administered    Name Date Dose VIS Date Route   Pfizer COVID-19 Vaccine 03/12/2019  8:16 AM 0.3 mL 01/18/2019 Intramuscular   Manufacturer: [place]   Lot: [id number]   [place]: [id number]

## 2019-04-15 DIAGNOSIS — R972 Elevated prostate specific antigen [PSA]: Secondary | ICD-10-CM | POA: Insufficient documentation

## 2019-04-15 NOTE — Assessment & Plan Note (Addendum)
Saw urology - Dr Arman Bogus - he recommended a bx, but declined it at this time and wants to just monitor will check psa today

## 2019-04-15 NOTE — Progress Notes (Signed)
Subjective:    Patient ID: Miguel Joseph, male    DOB: 09-Feb-1940, 79 y.o.   MRN: [id number]  HPI He is here for a physical exam.    He has intermittent back pain depending on activity.  He rarely uses a heating pad.  He does not take anything for it.    He did see urology regarding the elevated PSA.  The PSA did decrease when it was rechecked.  It was recommended for him to have a biopsy, but he declined at this point and just wants to monitor it.  He would like it checked today.  Overall he feels well and has no concerns.  Medications and allergies reviewed with patient and updated if appropriate.  Patient Active Problem List   Diagnosis Date Noted  . History of skin cancer 04/16/2019  . Elevated PSA 04/15/2019  . Numbness and tingling of both legs 02/12/2018  . CKD (chronic kidney disease) 02/08/2017  . 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 Lumbar radiculopathy 02/09/2016  . Prediabetes 06/29/2015  . Diverticulosis of colon without hemorrhage 12/23/2014  . NONSPECIFIC ABNORMAL ELECTROCARDIOGRAM 05/05/2009  . Hyperlipidemia 04/15/2008  . Midline low back pain without sciatica 04/15/2008    Current Outpatient Medications on File Prior to Visit  Medication Sig Dispense Refill  . aspirin EC 81 MG tablet Take 81 mg by mouth daily.    . Multiple Vitamins-Minerals (ANTIOXIDANT FORMULA SG) capsule Take 1 capsule by mouth daily.     No current facility-administered medications on file prior to visit.    Past Medical History:  Diagnosis Date  . Anal fissure   . Diverticulosis   . E. coli UTI (urinary tract infection) 12/07/2012   Rx: Cipro  . Hyperlipidemia     Past Surgical History:  Procedure Laterality Date  . APPENDECTOMY    . COLONOSCOPY  2001 &;2007;2016   internal hemorrhoids; Dr Fuller Plan  . TONSILLECTOMY AND ADENOIDECTOMY    . WISDOM TOOTH EXTRACTION      Social History   Socioeconomic History  . Marital status: Married    Spouse  name: Not on file  . Number of children: 3  . Years of education: Not on file  . Highest education level: Professional school degree (e.g., MD, DDS, DVM, JD)  Occupational History  . Occupation: retired Forensic psychologist  Tobacco Use  . Smoking status: Never Smoker  . Smokeless tobacco: Never Used  Substance and Sexual Activity  . Alcohol use: Yes    Alcohol/week: 4.0 standard drinks    Types: 4 Glasses of wine per week    Comment: He drinks 1-2 glasses per week  . Drug use: Yes  . Sexual activity: Not on file  Other Topics Concern  . Not on file  Social History Narrative   He is retired Forensic psychologist in 2010.   He lives with wife.  He has three grown children.   Social Determinants of Health   Financial Resource Strain:   . Difficulty of Paying Living Expenses: Not on file  Food Insecurity:   . Worried About Charity fundraiser in the Last Year: Not on file  . Ran Out of Food in the Last Year: Not on file  Transportation Needs:   . Lack of Transportation (Medical): Not on file  . Lack of Transportation (Non-Medical): Not on file  Physical Activity:   . Days of Exercise per Week: Not on file  . Minutes of Exercise per Session: Not  on file  Stress:   . Feeling of Stress : Not on file  Social Connections:   . Frequency of Communication with Friends and Family: Not on file  . Frequency of Social Gatherings with Friends and Family: Not on file  . Attends Religious Services: Not on file  . Active Member of Clubs or Organizations: Not on file  . Attends Archivist Meetings: Not on file  . Marital Status: Not on file    Family History  Problem Relation Age of Onset  . Breast cancer Mother   . Ovarian cancer Mother   . Testicular cancer Father   . Breast cancer Sister   . Pancreatic cancer Paternal Uncle         X2   . Heart block Paternal Aunt        pacer  . Colon polyps Son 71       tubular adenoma  . Diabetes Neg Hx   . Stroke Neg Hx   . Heart disease Neg Hx   .  Hypertension Neg Hx     Review of Systems  Constitutional: Negative for chills and fever.  HENT: Positive for hearing loss (wears hearing aides).   Eyes: Negative for visual disturbance.  Respiratory: Negative for cough, shortness of breath and wheezing.   Cardiovascular: Negative for chest pain, palpitations and leg swelling.  Gastrointestinal: Negative for abdominal pain, blood in stool, constipation, diarrhea and nausea.       No gerd  Genitourinary: Negative for difficulty urinating, dysuria and hematuria.  Musculoskeletal: Positive for arthralgias and back pain.  Skin: Negative for rash.  Neurological: Negative for light-headedness and headaches.  Psychiatric/Behavioral: Negative for dysphoric mood. The patient is not nervous/anxious.        Objective:   Vitals:   04/16/19 1002  BP: 126/74  Pulse: 71  Resp: 16  Temp: 97.8 F (36.6 C)  SpO2: 98%   Filed Weights   04/16/19 1002  Weight: 167 lb (75.8 kg)   Body mass index is 25.39 kg/m.  BP Readings from Last 3 Encounters:  04/16/19 126/74  04/16/18 108/70  02/12/18 122/62    Wt Readings from Last 3 Encounters:  04/16/19 167 lb (75.8 kg)  07/04/18 160 lb (72.6 kg)  04/16/18 167 lb 8 oz (76 kg)     Physical Exam Constitutional: He appears well-developed and well-nourished. No distress.  HENT:  Head: Normocephalic and atraumatic.  Right Ear: External ear normal.  Left Ear: External ear normal.  Mouth/Throat: Oropharynx is clear and moist.  Normal ear canals and TM b/l  Eyes: Conjunctivae and EOM are normal.  Neck: Neck supple. No tracheal deviation present. No thyromegaly present.  No carotid bruit  Cardiovascular: Normal rate, regular rhythm, normal heart sounds and intact distal pulses.   No murmur heard. Pulmonary/Chest: Effort normal and breath sounds normal. No respiratory distress. He has no wheezes. He has no rales.  Abdominal: Soft. He exhibits no distension. There is no tenderness.    Genitourinary: deferred  Musculoskeletal: He exhibits no edema.  Lymphadenopathy:   He has no cervical adenopathy.  Skin: Skin is warm and dry. He is not diaphoretic.  Psychiatric: He has a normal mood and affect. His behavior is normal.         Assessment & Plan:   Physical exam: Screening blood work  ordered Immunizations  Up to date  Colonoscopy   N/a - no longer needed due to age Eye exams   Up to date  Exercise   Not regular - yard work, active, no gym since covid Weight  Normal BMI Substance abuse   none  See Problem List for Assessment and Plan of chronic medical problems.    This visit occurred during the SARS-CoV-2 public health emergency.  Safety protocols were in place, including screening questions prior to the visit, additional usage of staff PPE, and extensive cleaning of exam room while observing appropriate contact time as indicated for disinfecting solutions.

## 2019-04-15 NOTE — Patient Instructions (Addendum)
Blood work was ordered.    All other Health Maintenance issues reviewed.   All recommended immunizations and age-appropriate screenings are up-to-date or discussed.  No immunization administered today.   Medications reviewed and updated.  Changes include :   none  Your prescription(s) have been submitted to your pharmacy. Please take as directed and contact our office if you believe you are having problem(s) with the medication(s).     Please followup in 1 year    Health Maintenance, Male Adopting a healthy lifestyle and getting preventive care are important in promoting health and wellness. Ask your health care provider about:  The right schedule for you to have regular tests and exams.  Things you can do on your own to prevent diseases and keep yourself healthy. What should I know about diet, weight, and exercise? Eat a healthy diet   Eat a diet that includes plenty of vegetables, fruits, low-fat dairy products, and lean protein.  Do not eat a lot of foods that are high in solid fats, added sugars, or sodium. Maintain a healthy weight Body mass index (BMI) is a measurement that can be used to identify possible weight problems. It estimates body fat based on height and weight. Your health care provider can help determine your BMI and help you achieve or maintain a healthy weight. Get regular exercise Get regular exercise. This is one of the most important things you can do for your health. Most adults should:  Exercise for at least 150 minutes each week. The exercise should increase your heart rate and make you sweat (moderate-intensity exercise).  Do strengthening exercises at least twice a week. This is in addition to the moderate-intensity exercise.  Spend less time sitting. Even light physical activity can be beneficial. Watch cholesterol and blood lipids Have your blood tested for lipids and cholesterol at 80 years of age, then have this test every 5 years. You may  need to have your cholesterol levels checked more often if:  Your lipid or cholesterol levels are high.  You are older than 80 years of age.  You are at high risk for heart disease. What should I know about cancer screening? Many types of cancers can be detected early and may often be prevented. Depending on your health history and family history, you may need to have cancer screening at various ages. This may include screening for:  Colorectal cancer.  Prostate cancer.  Skin cancer.  Lung cancer. What should I know about heart disease, diabetes, and high blood pressure? Blood pressure and heart disease  High blood pressure causes heart disease and increases the risk of stroke. This is more likely to develop in people who have high blood pressure readings, are of African descent, or are overweight.  Talk with your health care provider about your target blood pressure readings.  Have your blood pressure checked: ? Every 3-5 years if you are 18-39 years of age. ? Every year if you are 40 years old or older.  If you are between the ages of 65 and 75 and are a current or former smoker, ask your health care provider if you should have a one-time screening for abdominal aortic aneurysm (AAA). Diabetes Have regular diabetes screenings. This checks your fasting blood sugar level. Have the screening done:  Once every three years after age 45 if you are at a normal weight and have a low risk for diabetes.  More often and at a younger age if you are overweight or have   a high risk for diabetes. What should I know about preventing infection? Hepatitis B If you have a higher risk for hepatitis B, you should be screened for this virus. Talk with your health care provider to find out if you are at risk for hepatitis B infection. Hepatitis C Blood testing is recommended for:  Everyone born from 1945 through 1965.  Anyone with known risk factors for hepatitis C. Sexually transmitted  infections (STIs)  You should be screened each year for STIs, including gonorrhea and chlamydia, if: ? You are sexually active and are younger than 80 years of age. ? You are older than 80 years of age and your health care provider tells you that you are at risk for this type of infection. ? Your sexual activity has changed since you were last screened, and you are at increased risk for chlamydia or gonorrhea. Ask your health care provider if you are at risk.  Ask your health care provider about whether you are at high risk for HIV. Your health care provider may recommend a prescription medicine to help prevent HIV infection. If you choose to take medicine to prevent HIV, you should first get tested for HIV. You should then be tested every 3 months for as long as you are taking the medicine. Follow these instructions at home: Lifestyle  Do not use any products that contain nicotine or tobacco, such as cigarettes, e-cigarettes, and chewing tobacco. If you need help quitting, ask your health care provider.  Do not use street drugs.  Do not share needles.  Ask your health care provider for help if you need support or information about quitting drugs. Alcohol use  Do not drink alcohol if your health care provider tells you not to drink.  If you drink alcohol: ? Limit how much you have to 0-2 drinks a day. ? Be aware of how much alcohol is in your drink. In the [place] one drink equals one 12 oz bottle of beer (355 mL), one 5 oz glass of wine (148 mL), or one 1 oz glass of hard liquor (44 mL). General instructions  Schedule regular health, dental, and eye exams.  Stay current with your vaccines.  Tell your health care provider if: ? You often feel depressed. ? You have ever been abused or do not feel safe at home. Summary  Adopting a healthy lifestyle and getting preventive care are important in promoting health and wellness.  Follow your health care provider's instructions about  healthy diet, exercising, and getting tested or screened for diseases.  Follow your health care provider's instructions on monitoring your cholesterol and blood pressure. This information is not intended to replace advice given to you by your health care provider. Make sure you discuss any questions you have with your health care provider. Document Revised: 01/17/2018 Document Reviewed: 01/17/2018 Elsevier Patient Education  2020 [employer] Inc.  

## 2019-04-16 ENCOUNTER — Ambulatory Visit (INDEPENDENT_AMBULATORY_CARE_PROVIDER_SITE_OTHER): Payer: Medicare Other | Admitting: Internal Medicine

## 2019-04-16 ENCOUNTER — Encounter: Payer: Self-pay | Admitting: Internal Medicine

## 2019-04-16 ENCOUNTER — Other Ambulatory Visit: Payer: Self-pay

## 2019-04-16 VITALS — BP 126/74 | HR 71 | Temp 97.8°F | Resp 16 | Ht 68.0 in | Wt 167.0 lb

## 2019-04-16 DIAGNOSIS — R972 Elevated prostate specific antigen [PSA]: Secondary | ICD-10-CM | POA: Diagnosis not present

## 2019-04-16 DIAGNOSIS — R7303 Prediabetes: Secondary | ICD-10-CM

## 2019-04-16 DIAGNOSIS — N1831 Chronic kidney disease, stage 3a: Secondary | ICD-10-CM

## 2019-04-16 DIAGNOSIS — Z0000 Encounter for general adult medical examination without abnormal findings: Secondary | ICD-10-CM

## 2019-04-16 DIAGNOSIS — M5416 Radiculopathy, lumbar region: Secondary | ICD-10-CM

## 2019-04-16 DIAGNOSIS — E7849 Other hyperlipidemia: Secondary | ICD-10-CM | POA: Diagnosis not present

## 2019-04-16 DIAGNOSIS — Z85828 Personal history of other malignant neoplasm of skin: Secondary | ICD-10-CM | POA: Insufficient documentation

## 2019-04-16 LAB — COMPREHENSIVE METABOLIC PANEL
ALT: 29 U/L (ref 0–53)
AST: 24 U/L (ref 0–37)
Albumin: 4.4 g/dL (ref 3.5–5.2)
Alkaline Phosphatase: 47 U/L (ref 39–117)
BUN: 22 mg/dL (ref 6–23)
CO2: 28 mEq/L (ref 19–32)
Calcium: 9.5 mg/dL (ref 8.4–10.5)
Chloride: 103 mEq/L (ref 96–112)
Creatinine, Ser: 1.33 mg/dL (ref 0.40–1.50)
GFR: 51.82 mL/min — ABNORMAL LOW (ref >60.00)
Glucose, Bld: 99 mg/dL (ref 70–99)
Potassium: 4.6 mEq/L (ref 3.5–5.1)
Sodium: 138 mEq/L (ref 135–145)
Total Bilirubin: 0.6 mg/dL (ref 0.2–1.2)
Total Protein: 7 g/dL (ref 6.0–8.3)

## 2019-04-16 LAB — LIPID PANEL
Cholesterol: 177 mg/dL (ref 0–200)
HDL: 49.1 mg/dL (ref >39.00)
LDL Cholesterol: 101 mg/dL — ABNORMAL HIGH (ref 0–99)
NonHDL: 127.7
Total CHOL/HDL Ratio: 4
Triglycerides: 133 mg/dL (ref 0.0–149.0)
VLDL: 26.6 mg/dL (ref 0.0–40.0)

## 2019-04-16 LAB — CBC WITH DIFFERENTIAL/PLATELET
Basophils Absolute: 0 10*3/uL (ref 0.0–0.1)
Basophils Relative: 0.5 % (ref 0.0–3.0)
Eosinophils Absolute: 0.2 10*3/uL (ref 0.0–0.7)
Eosinophils Relative: 2.6 % (ref 0.0–5.0)
HCT: 48.6 % (ref 39.0–52.0)
Hemoglobin: 16.4 g/dL (ref 13.0–17.0)
Lymphocytes Relative: 13.7 % (ref 12.0–46.0)
Lymphs Abs: 1.1 10*3/uL (ref 0.7–4.0)
MCHC: 33.7 g/dL (ref 30.0–36.0)
MCV: 95.6 fl (ref 78.0–100.0)
Monocytes Absolute: 0.8 10*3/uL (ref 0.1–1.0)
Monocytes Relative: 9.9 % (ref 3.0–12.0)
Neutro Abs: 5.9 10*3/uL (ref 1.4–7.7)
Neutrophils Relative %: 73.3 % (ref 43.0–77.0)
Platelets: 216 10*3/uL (ref 150.0–400.0)
RBC: 5.08 Mil/uL (ref 4.22–5.81)
RDW: 14.4 % (ref 11.5–15.5)
WBC: 8 10*3/uL (ref 4.0–10.5)

## 2019-04-16 LAB — TSH: TSH: 2.56 u[IU]/mL (ref 0.35–4.50)

## 2019-04-16 LAB — HEMOGLOBIN A1C: Hgb A1c MFr Bld: 5.7 % (ref 4.6–6.5)

## 2019-04-16 LAB — PSA, MEDICARE: PSA: 12.24 ng/ml — ABNORMAL HIGH (ref 0.10–4.00)

## 2019-04-16 MED ORDER — PRAVASTATIN SODIUM 40 MG PO TABS: 40.0000 mg | ORAL_TABLET | Freq: Every day | ORAL | 3 refills | Status: DC

## 2019-04-16 NOTE — Assessment & Plan Note (Signed)
Has chronic lower back intermittently depending on activity Uses heat on occasion Does not take any medication

## 2019-04-16 NOTE — Assessment & Plan Note (Signed)
Sees dermatology annually

## 2019-04-16 NOTE — Assessment & Plan Note (Signed)
Seeing nephrology annually Stable, mild  cmp

## 2019-04-16 NOTE — Assessment & Plan Note (Signed)
Chronic Check lipid panel, cmp Continue daily statin Regular exercise and healthy diet encouraged  

## 2019-04-16 NOTE — Assessment & Plan Note (Signed)
Chronic Check a1c Low sugar / carb diet Stressed regular exercise  

## 2019-04-18 ENCOUNTER — Encounter: Payer: Self-pay | Admitting: Internal Medicine

## 2019-06-28 HISTORY — PX: PROSTATE BIOPSY: SHX241

## 2019-07-06 ENCOUNTER — Emergency Department (HOSPITAL_COMMUNITY)
Admission: EM | Admit: 2019-07-06 | Discharge: 2019-07-06 | Disposition: A | Discharge status: Home / Self Care | Payer: Medicare Other | Attending: Emergency Medicine | Admitting: Emergency Medicine

## 2019-07-06 ENCOUNTER — Encounter (HOSPITAL_COMMUNITY): Payer: Self-pay

## 2019-07-06 ENCOUNTER — Other Ambulatory Visit: Payer: Self-pay

## 2019-07-06 ENCOUNTER — Emergency Department (HOSPITAL_COMMUNITY): Payer: Medicare Other

## 2019-07-06 DIAGNOSIS — Z7982 Long term (current) use of aspirin: Secondary | ICD-10-CM | POA: Insufficient documentation

## 2019-07-06 DIAGNOSIS — H5460 Unqualified visual loss, one eye, unspecified: Secondary | ICD-10-CM | POA: Diagnosis not present

## 2019-07-06 DIAGNOSIS — N189 Chronic kidney disease, unspecified: Secondary | ICD-10-CM | POA: Diagnosis not present

## 2019-07-06 DIAGNOSIS — Z79899 Other long term (current) drug therapy: Secondary | ICD-10-CM | POA: Diagnosis not present

## 2019-07-06 DIAGNOSIS — R0602 Shortness of breath: Secondary | ICD-10-CM | POA: Insufficient documentation

## 2019-07-06 DIAGNOSIS — N390 Urinary tract infection, site not specified: Secondary | ICD-10-CM | POA: Insufficient documentation

## 2019-07-06 DIAGNOSIS — Z85828 Personal history of other malignant neoplasm of skin: Secondary | ICD-10-CM | POA: Diagnosis not present

## 2019-07-06 DIAGNOSIS — R509 Fever, unspecified: Secondary | ICD-10-CM | POA: Diagnosis present

## 2019-07-06 LAB — URINALYSIS, ROUTINE W REFLEX MICROSCOPIC
Bilirubin Urine: NEGATIVE
Glucose, UA: NEGATIVE mg/dL
Ketones, ur: NEGATIVE mg/dL
Nitrite: POSITIVE — AB
Protein, ur: NEGATIVE mg/dL
Specific Gravity, Urine: 1.016 (ref 1.005–1.030)
pH: 6 (ref 5.0–8.0)

## 2019-07-06 LAB — COMPREHENSIVE METABOLIC PANEL
ALT: 22 U/L (ref 0–44)
AST: 22 U/L (ref 15–41)
Albumin: 4.5 g/dL (ref 3.5–5.0)
Alkaline Phosphatase: 50 U/L (ref 38–126)
Anion gap: 13 (ref 5–15)
BUN: 26 mg/dL — ABNORMAL HIGH (ref 8–23)
CO2: 21 mmol/L — ABNORMAL LOW (ref 22–32)
Calcium: 9.1 mg/dL (ref 8.9–10.3)
Chloride: 103 mmol/L (ref 98–111)
Creatinine, Ser: 1.43 mg/dL — ABNORMAL HIGH (ref 0.61–1.24)
GFR calc Af Amer: 54 mL/min — ABNORMAL LOW (ref >60)
GFR calc non Af Amer: 46 mL/min — ABNORMAL LOW (ref >60)
Glucose, Bld: 118 mg/dL — ABNORMAL HIGH (ref 70–99)
Potassium: 4 mmol/L (ref 3.5–5.1)
Sodium: 137 mmol/L (ref 135–145)
Total Bilirubin: 0.9 mg/dL (ref 0.3–1.2)
Total Protein: 7.4 g/dL (ref 6.5–8.1)

## 2019-07-06 LAB — CBC WITH DIFFERENTIAL/PLATELET
Abs Immature Granulocytes: 0.06 10*3/uL (ref 0.00–0.07)
Basophils Absolute: 0 10*3/uL (ref 0.0–0.1)
Basophils Relative: 0 %
Eosinophils Absolute: 0.1 10*3/uL (ref 0.0–0.5)
Eosinophils Relative: 1 %
HCT: 46.2 % (ref 39.0–52.0)
Hemoglobin: 15.5 g/dL (ref 13.0–17.0)
Immature Granulocytes: 0 %
Lymphocytes Relative: 7 %
Lymphs Abs: 0.9 10*3/uL (ref 0.7–4.0)
MCH: 32.2 pg (ref 26.0–34.0)
MCHC: 33.5 g/dL (ref 30.0–36.0)
MCV: 96 fL (ref 80.0–100.0)
Monocytes Absolute: 1.8 10*3/uL — ABNORMAL HIGH (ref 0.1–1.0)
Monocytes Relative: 13 %
Neutro Abs: 10.8 10*3/uL — ABNORMAL HIGH (ref 1.7–7.7)
Neutrophils Relative %: 79 %
Platelets: 197 10*3/uL (ref 150–400)
RBC: 4.81 MIL/uL (ref 4.22–5.81)
RDW: 14.3 % (ref 11.5–15.5)
WBC: 13.7 10*3/uL — ABNORMAL HIGH (ref 4.0–10.5)
nRBC: 0 % (ref 0.0–0.2)

## 2019-07-06 LAB — LACTIC ACID, PLASMA: Lactic Acid, Venous: 1.4 mmol/L (ref 0.5–1.9)

## 2019-07-06 MED ORDER — SODIUM CHLORIDE (PF) 0.9 % IJ SOLN
INTRAMUSCULAR | Status: AC
  Filled 2019-07-06: qty 50

## 2019-07-06 MED ORDER — IOHEXOL 350 MG/ML SOLN
100.0000 mL | Freq: Once | INTRAVENOUS | Status: AC | PRN
  Administered 2019-07-06: 100 mL via INTRAVENOUS

## 2019-07-06 MED ORDER — SODIUM CHLORIDE 0.9 % IV SOLN
1.0000 g | INTRAVENOUS | Status: DC
  Administered 2019-07-06: 1 g via INTRAVENOUS
  Filled 2019-07-06: qty 10

## 2019-07-06 MED ORDER — CEPHALEXIN 500 MG PO CAPS: 500.0000 mg | ORAL_CAPSULE | Freq: Four times a day (QID) | ORAL | 0 refills | Status: DC

## 2019-07-06 NOTE — ED Triage Notes (Signed)
Patient states he had a prostate biopsy the other day and now he has blood coming out of his penis. Patient says he normally has back pain but it has been worse today with movement. Patient also states he had temperature and is getting short of breath when walking. Patient also says he just feels "lousy"

## 2019-07-06 NOTE — ED Notes (Signed)
Patient transported to CT 

## 2019-07-06 NOTE — ED Provider Notes (Signed)
[facility] DEPT Provider Note   CSN: [id number] Arrival date & time: 07/06/19  1827     History No chief complaint on file.   Miguel Joseph is a 79 y.o. male.  79 year old male who presents with shortness of breath.  Patient noted that he had a fever at home up to 101.  Denies any vomiting, diarrhea.  States that when he notes his bowel he notes that he has blood at the tip of his penis.  He denies any dysuria.  He did have a prostate biopsy a week ago.  No chest pain or chest pressure.  Dyspnea is only with exertion and denies any pedal edema.        Past Medical History:  Diagnosis Date  . Anal fissure   . Diverticulosis   . E. coli UTI (urinary tract infection) 12/07/2012   Rx: Cipro  . Hyperlipidemia     Patient Active Problem List   Diagnosis Date Noted  . History of skin cancer 04/16/2019  . Elevated PSA 04/15/2019  . Numbness and tingling of both legs 02/12/2018  . CKD (chronic kidney disease) 02/08/2017  . 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 Lumbar radiculopathy 02/09/2016  . Prediabetes 06/29/2015  . Diverticulosis of colon without hemorrhage 12/23/2014  . NONSPECIFIC ABNORMAL ELECTROCARDIOGRAM 05/05/2009  . Hyperlipidemia 04/15/2008  . Midline low back pain without sciatica 04/15/2008    Past Surgical History:  Procedure Laterality Date  . APPENDECTOMY    . COLONOSCOPY  2001 &;2007;2016   internal hemorrhoids; Dr Fuller Plan  . TONSILLECTOMY AND ADENOIDECTOMY    . WISDOM TOOTH EXTRACTION         Family History  Problem Relation Age of Onset  . Breast cancer Mother   . Ovarian cancer Mother   . Testicular cancer Father   . Breast cancer Sister   . Pancreatic cancer Paternal Uncle         X2   . Heart block Paternal Aunt        pacer  . Colon polyps Son 66       tubular adenoma  . Diabetes Neg Hx   . Stroke Neg Hx   . Heart disease Neg Hx   . Hypertension Neg Hx     Social History    Tobacco Use  . Smoking status: Never Smoker  . Smokeless tobacco: Never Used  Substance Use Topics  . Alcohol use: Yes    Alcohol/week: 4.0 standard drinks    Types: 4 Glasses of wine per week    Comment: He drinks 1-2 glasses per week  . Drug use: Yes    Home Medications Prior to Admission medications   Medication Sig Start Date End Date Taking? Authorizing Provider  aspirin EC 81 MG tablet Take 81 mg by mouth daily. 12/17/12   Hendricks Limes, MD  Multiple Vitamins-Minerals (ANTIOXIDANT FORMULA SG) capsule Take 1 capsule by mouth daily.    [provider]  pravastatin (PRAVACHOL) 40 MG tablet Take 1 tablet (40 mg total) by mouth daily. 04/16/19   Binnie Rail, MD    Allergies    Patient has no known allergies.  Review of Systems   Review of Systems  All other systems reviewed and are negative.   Physical Exam Updated Vital Signs BP 115/75 (BP Location: Left Arm)   Pulse 97   Temp 98.5 F (36.9 C) (Oral)   Resp (!) 22   Ht 1.727 m (  5\' 8" )   Wt 72.6 kg   SpO2 96%   BMI 24.33 kg/m   Physical Exam Vitals and nursing note reviewed.  Constitutional:      General: He is not in acute distress.    Appearance: Normal appearance. He is well-developed. He is not toxic-appearing.  HENT:     Head: Normocephalic and atraumatic.  Eyes:     General: Lids are normal.     Conjunctiva/sclera: Conjunctivae normal.     Pupils: Pupils are equal, round, and reactive to light.  Neck:     Thyroid: No thyroid mass.     Trachea: No tracheal deviation.  Cardiovascular:     Rate and Rhythm: Normal rate and regular rhythm.     Heart sounds: Normal heart sounds. No murmur. No gallop.   Pulmonary:     Effort: Pulmonary effort is normal. No respiratory distress.     Breath sounds: Normal breath sounds. No stridor. No decreased breath sounds, wheezing, rhonchi or rales.  Abdominal:     General: Bowel sounds are normal. There is no distension.     Palpations: Abdomen is  soft.     Tenderness: There is no abdominal tenderness. There is no rebound.  Musculoskeletal:        General: No tenderness. Normal range of motion.     Cervical back: Normal range of motion and neck supple.  Skin:    General: Skin is warm and dry.     Findings: No abrasion or rash.  Neurological:     Mental Status: He is alert and oriented to person, place, and time.     GCS: GCS eye subscore is 4. GCS verbal subscore is 5. GCS motor subscore is 6.     Cranial Nerves: No cranial nerve deficit.     Sensory: No sensory deficit.  Psychiatric:        Speech: Speech normal.        Behavior: Behavior normal.     ED Results / Procedures / Treatments   Labs (all labs ordered are listed, but only abnormal results are displayed) Labs Reviewed  URINALYSIS, ROUTINE W REFLEX MICROSCOPIC - Abnormal; Notable for the following components:      Result Value   Hgb urine dipstick MODERATE (*)    Nitrite POSITIVE (*)    Leukocytes,Ua MODERATE (*)    Bacteria, UA RARE (*)    All other components within normal limits  URINE CULTURE  CBC WITH DIFFERENTIAL/PLATELET  COMPREHENSIVE METABOLIC PANEL    EKG None  Radiology No results found.  Procedures Procedures (including critical care time)  Medications Ordered in ED Medications - No data to display  ED Course  I have reviewed the triage vital signs and the nursing notes.  Pertinent labs & imaging results that were available during my care of the patient were reviewed by me and considered in my medical decision making (see chart for details).    MDM Rules/Calculators/A&P                      Given IV Rocephin for UTI.  Mild leukocytosis noted.  CT of chest negative for PE.  CT abdomen negative for any acute findings.  Culture sent and will place patient on Keflex Final Clinical Impression(s) / ED Diagnoses Final diagnoses:  SOB (shortness of breath)    Rx / DC Orders ED Discharge Orders    None       Lacretia Leigh,  MD 07/06/19 2321

## 2019-07-09 LAB — URINE CULTURE: Culture: >=100000 — AB

## 2019-07-10 ENCOUNTER — Telehealth: Payer: Self-pay | Admitting: *Deleted

## 2019-07-10 NOTE — Telephone Encounter (Signed)
Post ED Visit - Positive Culture Follow-up  Culture report reviewed by antimicrobial stewardship pharmacist: [facility] Team [x]  Elenor Quinones, Pharm.D. []  Heide Guile, Pharm.D., BCPS AQ-ID []  Parks Neptune, Pharm.D., BCPS []  Alycia Rossetti, Pharm.D., BCPS []  [place], Pharm.D., BCPS, AAHIVP []  Legrand Como, Pharm.D., BCPS, AAHIVP []  Salome Arnt, PharmD, BCPS []  Johnnette Gourd, PharmD, BCPS []  Hughes Better, PharmD, BCPS []  Leeroy Cha, PharmD []  Laqueta Linden, PharmD, BCPS []  Albertina Parr, PharmD  [place] Team []  Leodis Sias, PharmD []  Lindell Spar, PharmD []  Royetta Asal, PharmD []  Graylin Shiver, Rph []  Rema Fendt) Glennon Mac, PharmD []  Arlyn Dunning, PharmD []  Netta Cedars, PharmD []  Dia Sitter, PharmD []  Leone Haven, PharmD []  Gretta Arab, PharmD []  Theodis Shove, PharmD []  Peggyann Juba, PharmD []  Reuel Boom, PharmD   Positive urine culture Treated with Cephalexin, organism sensitive to the same and no further patient follow-up is required at this time.  Harlon Flor [facility] 07/10/2019, 11:08 AM

## 2019-07-11 LAB — CULTURE, BLOOD (ROUTINE X 2)
Culture: NO GROWTH
Culture: NO GROWTH
Special Requests: ADEQUATE

## 2019-07-16 ENCOUNTER — Encounter: Payer: Self-pay | Admitting: Radiation Oncology

## 2019-07-16 ENCOUNTER — Other Ambulatory Visit (HOSPITAL_COMMUNITY): Payer: Self-pay | Admitting: Urology

## 2019-07-16 DIAGNOSIS — C61 Malignant neoplasm of prostate: Secondary | ICD-10-CM

## 2019-08-01 ENCOUNTER — Encounter (HOSPITAL_COMMUNITY)
Admission: RE | Admit: 2019-08-01 | Discharge: 2019-08-01 | Disposition: A | Discharge status: Home / Self Care | Payer: Medicare Other | Source: Ambulatory Visit | Attending: Urology | Admitting: Urology

## 2019-08-01 ENCOUNTER — Other Ambulatory Visit: Payer: Self-pay

## 2019-08-01 DIAGNOSIS — C61 Malignant neoplasm of prostate: Secondary | ICD-10-CM | POA: Diagnosis not present

## 2019-08-01 MED ORDER — TECHNETIUM TC 99M MEDRONATE IV KIT
20.5000 | PACK | Freq: Once | INTRAVENOUS | Status: AC | PRN
  Administered 2019-08-01: 20.5 via INTRAVENOUS

## 2019-08-08 ENCOUNTER — Encounter: Payer: Self-pay | Admitting: Radiation Oncology

## 2019-08-08 NOTE — Progress Notes (Addendum)
GU Location of Tumor / Histology: prostatic adenocarcinoma  If Prostate Cancer, Gleason Score is (4 + 3) and PSA is (12.24). Prostate volume: 54 grams.  Miguel Joseph was evaluated by Dr. Diona Fanti in July 2020. Patient's PSA at that time was 10.85 and a prostate biopsy was recommended. Unfortunately, patient never followed through with having the biopsy done. Patient explains that around this time last year he presented to [facility] after noting blood in his semen.   Biopsies of prostate (if applicable) revealed:   Past/Anticipated interventions by urology, if any: prostate biopsy, referral for consideration of radiotherapy  Past/Anticipated interventions by medical oncology, if any: no  Weight changes, if any: denies  Bowel/Bladder complaints, if any: IPSS 2 with nocturia. SHIM 24. Reports a good force of stream. Denies difficulty emptying his bladder. Reports occasional urgency and frequency. Reports nocturia x2. Denies dysuria, hematuria, urinary leakage or incontinence. Denies any bowel complaints.   Nausea/Vomiting, if any: denies  Pain issues, if any:  Reports intermittent low back pain for years  SAFETY ISSUES:  Prior radiation? denies  Pacemaker/ICD? denies  Possible current pregnancy? no, male patient  Is the patient on methotrexate? denies  Current Complaints / other details:  79 year old male. Retired Forensic psychologist. Resides in [place] with wife. Patient has three grown children. Strong family history of cancer.

## 2019-08-13 ENCOUNTER — Ambulatory Visit
Admission: RE | Admit: 2019-08-13 | Discharge: 2019-08-13 | Disposition: A | Discharge status: Home / Self Care | Payer: Medicare Other | Source: Ambulatory Visit | Attending: Radiation Oncology | Admitting: Radiation Oncology

## 2019-08-13 ENCOUNTER — Encounter: Payer: Self-pay | Admitting: Radiation Oncology

## 2019-08-13 ENCOUNTER — Other Ambulatory Visit: Payer: Self-pay

## 2019-08-13 ENCOUNTER — Telehealth: Payer: Self-pay | Admitting: *Deleted

## 2019-08-13 VITALS — Ht 68.0 in | Wt 160.0 lb

## 2019-08-13 DIAGNOSIS — C61 Malignant neoplasm of prostate: Secondary | ICD-10-CM

## 2019-08-13 HISTORY — DX: Malignant neoplasm of prostate: C61

## 2019-08-13 HISTORY — DX: Chronic kidney disease, unspecified: N18.9

## 2019-08-13 NOTE — Progress Notes (Signed)
Radiation Oncology         [phone] ________________________________  Initial Outpatient Consultation - Conducted via MyChart due to current COVID-19 concerns for limiting patient exposure  Name: Miguel Joseph MRN: [id number]  Date: 08/13/2019  DOB: 06-May-1940  CC:Binnie Rail, MD  Davis Gourd*   REFERRING PHYSICIAN: Davis Gourd*  DIAGNOSIS: 79 y.o. gentleman with Stage T1c adenocarcinoma of the prostate with Gleason score of 4+3, and PSA of 12.24.    ICD-10-CM   1. Malignant neoplasm of prostate ([place])  C61     HISTORY OF PRESENT ILLNESS: Miguel Joseph is a 79 y.o. male with a diagnosis of prostate cancer. He was initially noted to have an elevated PSA of 10.85 by his primary care physician, Dr. Quay Burow.  Accordingly, he was referred for evaluation in urology by Dr. Diona Fanti on 08/27/2018,  digital rectal examination was performed at that time revealing a 4 mm area of left lobe firmness.  A repeat PSA remained elevated at 7.98 on 10/08/18 so a prostate biopsy was recommended at that time, but the patient did not follow through.    His PCP noted that his PSA was further elevated at 12.24 on 04/16/2019 so he was referred back to [facility] urology for further evaluation at that time.  He met with Dr. Lovena Neighbours on 05/08/2019 who recommended proceeding with prostate biopsy. The patient agreed and transrectal ultrasound with 12 biopsies of the prostate was performed on 06/28/2019. The prostate volume measured 54 cc.  Out of 12 core biopsies, 3 were positive.  The maximum Gleason score was 4+3, and this was seen in the left base, with perineural invasion.  Additionally, Gleason 3+4 was seen in the left base lateral and left mid lateral.  He had imaging for disease staging with CT A/P on 07/06/2019 which was negative for any visceral metastatic disease and a bone scan on 08/01/2019 which was negative for osseous metastatic disease.  The patient reviewed the biopsy results with  his urologist and he has kindly been referred today for discussion of potential radiation treatment options.   PREVIOUS RADIATION THERAPY: No  PAST MEDICAL HISTORY:  Past Medical History:  Diagnosis Date  . Anal fissure   . Chronic kidney disease    managed by Dr. Pearson Grippe. Stage III chronic kidney disease. Stable.  . Diverticulosis   . E. coli UTI (urinary tract infection) 12/07/2012   Rx: Cipro  . Hyperlipidemia   . Prostate cancer ([place])       PAST SURGICAL HISTORY: Past Surgical History:  Procedure Laterality Date  . APPENDECTOMY    . COLONOSCOPY  2001 &;2007;2016   internal hemorrhoids; Dr Fuller Plan  . PROSTATE BIOPSY    . TONSILLECTOMY AND ADENOIDECTOMY    . WISDOM TOOTH EXTRACTION      FAMILY HISTORY:  Family History  Problem Relation Age of Onset  . Breast cancer Mother   . Ovarian cancer Mother   . Testicular cancer Father   . Breast cancer Sister   . Pancreatic cancer Paternal Uncle         X2   . Heart block Paternal Aunt        pacer  . Colon polyps Son 90       tubular adenoma  . Diabetes Neg Hx   . Stroke Neg Hx   . Heart disease Neg Hx   . Hypertension Neg Hx   . Prostate cancer Neg Hx   . Colon cancer Neg Hx  SOCIAL HISTORY:  Social History   Socioeconomic History  . Marital status: Married    Spouse name: Not on file  . Number of children: 3  . Years of education: Not on file  . Highest education level: Professional school degree (e.g., MD, DDS, DVM, JD)  Occupational History  . Occupation: retired Forensic psychologist  Tobacco Use  . Smoking status: Never Smoker  . Smokeless tobacco: Never Used  Vaping Use  . Vaping Use: Never used  Substance and Sexual Activity  . Alcohol use: Yes    Alcohol/week: 4.0 standard drinks    Types: 4 Glasses of wine per week    Comment: He drinks 1-2 glasses per week  . Drug use: Never  . Sexual activity: Yes  Other Topics Concern  . Not on file  Social History Narrative   He is retired Forensic psychologist in 2010.    He lives with wife.  He has three grown children.   Social Determinants of Health   Financial Resource Strain:   . Difficulty of Paying Living Expenses:   Food Insecurity:   . Worried About Charity fundraiser in the Last Year:   . Arboriculturist in the Last Year:   Transportation Needs:   . Film/video editor (Medical):   Marland Kitchen Lack of Transportation (Non-Medical):   Physical Activity:   . Days of Exercise per Week:   . Minutes of Exercise per Session:   Stress:   . Feeling of Stress :   Social Connections:   . Frequency of Communication with Friends and Family:   . Frequency of Social Gatherings with Friends and Family:   . Attends Religious Services:   . Active Member of Clubs or Organizations:   . Attends Archivist Meetings:   Marland Kitchen Marital Status:   Intimate Partner Violence:   . Fear of Current or Ex-Partner:   . Emotionally Abused:   Marland Kitchen Physically Abused:   . Sexually Abused:     ALLERGIES: Patient has no known allergies.  MEDICATIONS:  Current Outpatient Medications  Medication Sig Dispense Refill  . aspirin EC 81 MG tablet Take 81 mg by mouth daily.    . Multiple Vitamins-Minerals (ANTIOXIDANT FORMULA SG) capsule Take 1 capsule by mouth daily.    . pravastatin (PRAVACHOL) 40 MG tablet Take 1 tablet (40 mg total) by mouth daily. 90 tablet 3   No current facility-administered medications for this encounter.    REVIEW OF SYSTEMS:  On review of systems, the patient reports that he is doing well overall. He denies any chest pain, shortness of breath, cough, fevers, chills, night sweats, unintended weight changes. He denies any bowel disturbances, and denies abdominal pain, nausea or vomiting. He denies any new musculoskeletal or joint aches or pains. His IPSS was 2, indicating mild urinary symptoms. He reports only nocturia. His SHIM was 24, indicating he does not have erectile dysfunction. A complete review of systems is obtained and is otherwise  negative.    PHYSICAL EXAM:  Wt Readings from Last 3 Encounters:  08/13/19 160 lb (72.6 kg)  07/06/19 160 lb (72.6 kg)  04/16/19 167 lb (75.8 kg)   Temp Readings from Last 3 Encounters:  07/06/19 98.5 F (36.9 C) (Oral)  04/16/19 97.8 F (36.6 C) (Oral)  02/12/18 98 F (36.7 C) (Oral)   BP Readings from Last 3 Encounters:  07/06/19 120/61  04/16/19 126/74  04/16/18 108/70   Pulse Readings from Last 3 Encounters:  07/06/19 83  04/16/19  71  04/16/18 75   Pain Assessment Pain Score: 0-No pain/10  In general this is a well appearing Caucasian gentleman in no acute distress. He's alert and oriented x4 and appropriate throughout the examination. Cardiopulmonary assessment is negative for acute distress and he exhibits normal effort.    KPS = 100  100 - Normal; no complaints; no evidence of disease. 90   - Able to carry on normal activity; minor signs or symptoms of disease. 80   - Normal activity with effort; some signs or symptoms of disease. 59   - Cares for self; unable to carry on normal activity or to do active work. 60   - Requires occasional assistance, but is able to care for most of his personal needs. 50   - Requires considerable assistance and frequent medical care. 49   - Disabled; requires special care and assistance. 41   - Severely disabled; hospital admission is indicated although death not imminent. 42   - Very sick; hospital admission necessary; active supportive treatment necessary. 10   - Moribund; fatal processes progressing rapidly. 0     - Dead  Karnofsky DA, Abelmann Landis, Craver LS and Burchenal [facility] [phone]) The use of the nitrogen mustards in the palliative treatment of carcinoma: with particular reference to bronchogenic carcinoma Cancer 1 634-56  LABORATORY DATA:  Lab Results  Component Value Date   WBC 13.7 (H) 07/06/2019   HGB 15.5 07/06/2019   HCT 46.2 07/06/2019   MCV 96.0 07/06/2019   PLT 197 07/06/2019   Lab Results  Component Value  Date   NA 137 07/06/2019   K 4.0 07/06/2019   CL 103 07/06/2019   CO2 21 (L) 07/06/2019   Lab Results  Component Value Date   ALT 22 07/06/2019   AST 22 07/06/2019   ALKPHOS 50 07/06/2019   BILITOT 0.9 07/06/2019     RADIOGRAPHY: NM Bone Scan Whole Body  Result Date: 08/02/2019 CLINICAL DATA:  Prostate cancer, worsening low back pain EXAM: NUCLEAR MEDICINE WHOLE BODY BONE SCAN TECHNIQUE: Whole body anterior and posterior images were obtained approximately 3 hours after intravenous injection of radiopharmaceutical. RADIOPHARMACEUTICALS:  20.5 mCi Technetium-25mMDP IV COMPARISON:  07/06/2019 FINDINGS: Anterior and posterior whole body planar images are obtained after radiotracer administration. There is normal physiologic excretion of radiotracer within the kidneys and bladder. Increased radiotracer activity throughout the lumbar spine corresponds to extensive multilevel spondylosis, facet hypertrophy, and right convex scoliosis seen on prior CT. There is no abnormal radiotracer activity to suggest bony metastatic disease. IMPRESSION: 1. No evidence of bony metastases. 2. Activity in the lumbar spine consistent with spondylosis and facet hypertrophy seen on previous CT. Electronically Signed   By: MRanda NgoM.D.   On: 08/02/2019 23:36      IMPRESSION/PLAN: This visit was conducted via MyChart to spare the patient unnecessary potential exposure in the healthcare setting during the current COVID-19 pandemic. 1. 79y.o. gentleman with Stage T1c adenocarcinoma of the prostate with Gleason Score of 4+3, and PSA of 12.24. We discussed the patient's workup and outlined the nature of prostate cancer in this setting. The patient's T stage, Gleason's score, and PSA put him into the unfavorable intermediate risk group. Accordingly, he is eligible for a variety of potential treatment options including brachytherapy, 5.5 weeks of external radiation, or prostatectomy. We discussed the available radiation  techniques, and focused on the details and logistics of delivery. We discussed and outlined the risks, benefits, short and long-term effects associated with  radiotherapy and compared and contrasted these with prostatectomy. We discussed the role of SpaceOAR in reducing the rectal toxicity associated with radiotherapy.  He and his wife were encouraged to ask questions that were answered to their stated satisfaction.  At the end of the conversation, the patient is interested in moving forward with brachytherapy and use of SpaceOAR to reduce rectal toxicity from radiotherapy.  He appears to have a good understanding of his disease and our treatment recommendations which are of curative intent.  We will share our discussion with Dr. Lovena Neighbours and move forward with coordinating this procedure and old scheduling his CT [facility] planning appointment in the near future.  The patient will be contacted by Romie Jumper in our office who will be working closely with him to coordinate OR scheduling and pre and post procedure appointments.  We will contact the pharmaceutical rep to ensure that Glasco is available at the time of procedure.  He will have a prostate MRI following his post-seed CT SIM to confirm appropriate distribution of the [place].     Given current concerns for patient exposure during the COVID-19 pandemic, this encounter was conducted via video-enabled MyChart visit. The patient has given verbal consent for this type of encounter. The time spent during this encounter was 60 minutes. The attendants for this meeting include Tyler Pita MD, Ashlyn Bruning PA-C, [place], and patient, MACSEN NUTTALL and his wife. During the encounter, Tyler Pita MD, Ashlyn Bruning PA-C, and scribe, Wilburn Mylar were located at [facility].  Patient, Wandell Scullion Rivkin and his wife were located at home.    Nicholos Johns, PA-C    Tyler Pita, MD  [facility] Direct Dial: [phone]  Fax: [phone] [email or web address]  Skype  [email or web address]  This document serves as a record of services personally performed by Tyler Pita, MD and Freeman Caldron, PA-C. It was created on their behalf by Wilburn Mylar, a trained medical scribe. The creation of this record is based on the scribe's personal observations and the provider's statements to them. This document has been checked and approved by the attending provider.

## 2019-08-13 NOTE — Telephone Encounter (Signed)
CALLED PATIENT TO ASK QUESTIONS, SPOKE WITH PATIENT 

## 2019-08-14 ENCOUNTER — Ambulatory Visit: Payer: Medicare Other | Admitting: Radiation Oncology

## 2019-08-14 ENCOUNTER — Ambulatory Visit (INDEPENDENT_AMBULATORY_CARE_PROVIDER_SITE_OTHER): Payer: Medicare Other

## 2019-08-14 DIAGNOSIS — Z0000 Encounter for general adult medical examination without abnormal findings: Secondary | ICD-10-CM

## 2019-08-14 NOTE — Progress Notes (Signed)
I connected with Miguel Joseph today by telephone and verified that I am speaking with the correct person using two identifiers. Location patient: home Location provider: work Persons participating in the virtual visit: [employer] and [employer]. Krishav Mamone, LPN  I discussed the limitations, risks, security and privacy concerns of performing an evaluation and management service by telephone and the availability of in person appointments. I also discussed with the patient that there may be a patient responsible charge related to this service. The patient expressed understanding and verbally consented to this telephonic visit.    Interactive audio and video telecommunications were attempted between this provider and patient, however failed, due to patient having technical difficulties OR patient did not have access to video capability.  We continued and completed visit with audio only.  Some vital signs may be absent or patient reported.   Time Spent with patient on telephone encounter: 20 minutes  Subjective:   Miguel Joseph is a 79 y.o. male who presents for Medicare Annual/Subsequent preventive examination.  Review of Systems    No ROS. Medicare Wellness Virtual Visit. Additional risk factors are reflected in social history. Cardiac Risk Factors include: advanced age (>64men, >76 women);dyslipidemia;family history of premature cardiovascular disease;male gender Sleep Patterns: No sleep issues, feels rested on waking and sleeps 6-8 hours nightly. Wakes up 1-2 times to void. Home Safety/Smoke Alarms: Feels safe in home; uses home alarm. Smoke alarms in place. Living environment: 2-story home; Lives with spouse; no needs for DME; good family support system. Seat Belt Safety/Bike Helmet: Wears seat belt.     Objective:    Today's Vitals   08/14/19 1244  PainSc: 0-No pain   There is no height or weight on file to calculate BMI.  Advanced Directives 08/14/2019 08/13/2019  Does Patient  Have a Medical Advance Directive? Yes Yes  Type of Paramedic of [place];Living will Living will;Healthcare Power of Attorney  Does patient want to make changes to medical advance directive? No - Patient declined No - Patient declined  Copy of [place] in Chart? - No - copy requested    Current Medications (verified) Outpatient Encounter Medications as of 08/14/2019  Medication Sig  . aspirin EC 81 MG tablet Take 81 mg by mouth daily.  . Multiple Vitamins-Minerals (ANTIOXIDANT FORMULA SG) capsule Take 1 capsule by mouth daily.  . pravastatin (PRAVACHOL) 40 MG tablet Take 1 tablet (40 mg total) by mouth daily.   No facility-administered encounter medications on file as of 08/14/2019.    Allergies (verified) Patient has no known allergies.   History: Past Medical History:  Diagnosis Date  . Anal fissure   . Chronic kidney disease    managed by Dr. Pearson Grippe. Stage III chronic kidney disease. Stable.  . Diverticulosis   . E. coli UTI (urinary tract infection) 12/07/2012   Rx: Cipro  . Hyperlipidemia   . Prostate cancer [facility])    Past Surgical History:  Procedure Laterality Date  . APPENDECTOMY    . COLONOSCOPY  2001 &;2007;2016   internal hemorrhoids; Dr Fuller Plan  . PROSTATE BIOPSY    . TONSILLECTOMY AND ADENOIDECTOMY    . WISDOM TOOTH EXTRACTION     Family History  Problem Relation Age of Onset  . Breast cancer Mother   . Ovarian cancer Mother   . Testicular cancer Father   . Breast cancer Sister   . Pancreatic cancer Paternal Uncle         X2   .  Heart block Paternal Aunt        pacer  . Colon polyps Son 82       tubular adenoma  . Diabetes Neg Hx   . Stroke Neg Hx   . Heart disease Neg Hx   . Hypertension Neg Hx   . Prostate cancer Neg Hx   . Colon cancer Neg Hx    Social History   Socioeconomic History  . Marital status: Married    Spouse name: Not on file  . Number of children: 3  . Years of education: Not on  file  . Highest education level: Professional school degree (e.g., MD, DDS, DVM, JD)  Occupational History  . Occupation: retired Forensic psychologist  Tobacco Use  . Smoking status: Never Smoker  . Smokeless tobacco: Never Used  Vaping Use  . Vaping Use: Never used  Substance and Sexual Activity  . Alcohol use: Yes    Alcohol/week: 4.0 standard drinks    Types: 4 Glasses of wine per week    Comment: He drinks 1-2 glasses per week  . Drug use: Never  . Sexual activity: Yes  Other Topics Concern  . Not on file  Social History Narrative   He is retired Forensic psychologist in 2010.   He lives with wife.  He has three grown children.   Social Determinants of Health   Financial Resource Strain: Low Risk   . Difficulty of Paying Living Expenses: Not hard at all  Food Insecurity: No Food Insecurity  . Worried About Charity fundraiser in the Last Year: Never true  . Ran Out of Food in the Last Year: Never true  Transportation Needs: No Transportation Needs  . Lack of Transportation (Medical): No  . Lack of Transportation (Non-Medical): No  Physical Activity: Sufficiently Active  . Days of Exercise per Week: 5 days  . Minutes of Exercise per Session: 30 min  Stress: No Stress Concern Present  . Feeling of Stress : Not at all  Social Connections: Socially Integrated  . Frequency of Communication with Friends and Family: More than three times a week  . Frequency of Social Gatherings with Friends and Family: More than three times a week  . Attends Religious Services: More than 4 times per year  . Active Member of Clubs or Organizations: Yes  . Attends Archivist Meetings: More than 4 times per year  . Marital Status: Married    Tobacco Counseling Counseling given: Not Answered   Clinical Intake:  Pre-visit preparation completed: Yes  Pain : No/denies pain Pain Score: 0-No pain     Nutritional Risks: None Diabetes: No  How often do you need to have someone help you when you read  instructions, pamphlets, or other written materials from your doctor or pharmacy?: 1 - Never What is the last grade level you completed in school?: 19 years; finished Law School  Diabetic? no  Interpreter Needed?: No  Information entered by :: [employer]. Elvis Laufer, LPN   Activities of Daily Living In your present state of health, do you have any difficulty performing the following activities: 08/14/2019  Hearing? N  Vision? N  Difficulty concentrating or making decisions? N  Walking or climbing stairs? N  Dressing or bathing? N  Doing errands, shopping? N  Preparing Food and eating ? N  Using the Toilet? N  In the past six months, have you accidently leaked urine? N  Do you have problems with loss of bowel control? N  Managing your  Medications? N  Managing your Finances? N  Housekeeping or managing your Housekeeping? N  Some recent data might be hidden    Patient Care Team: Binnie Rail, MD as PCP - General (Internal Medicine) Alda Berthold, DO as Consulting Physician (Neurology)  Indicate any recent Medical Services you may have received from other than [facility] providers in the past year (date may be approximate).     Assessment:   This is a routine wellness examination for Miguel Joseph.  Hearing/Vision screen No exam data present  Dietary issues and exercise activities discussed: Current Exercise Habits: Home exercise routine, Type of exercise: walking (yard work and gardening; no strenous exercise), Time (Minutes): 30, Frequency (Times/Week): 5, Weekly Exercise (Minutes/Week): 150, Intensity: Moderate  Goals    .  Patient Stated (pt-stated)      Would like to get rid of this prostate cancer that I was just diagnosed with.      Depression Screen PHQ 2/9 Scores 08/14/2019 04/16/2019 02/12/2018 02/09/2017 02/09/2016 12/17/2012  PHQ - 2 Score 0 0 0 0 0 0    Fall Risk Fall Risk  08/14/2019 04/16/2019 07/04/2018 04/16/2018 02/12/2018  Falls in the past year? 0 0 1 0 0  Number falls in past  yr: 0 0 0 0 0  Injury with Fall? 0 0 0 0 0  Risk for fall due to : No Fall Risks - - - -  Follow up Falls evaluation completed - Falls evaluation completed Falls evaluation completed -    Any stairs in or around the home? Yes  If so, are there any without handrails? No  Home free of loose throw rugs in walkways, pet beds, electrical cords, etc? Yes  Adequate lighting in your home to reduce risk of falls? Yes   ASSISTIVE DEVICES UTILIZED TO PREVENT FALLS:  Life alert? No  Use of a cane, walker or w/c? No  Grab bars in the bathroom? No  Shower chair or bench in shower? No  Elevated toilet seat or a handicapped toilet? No   TIMED UP AND GO:  Was the test performed? No .  Length of time to ambulate 10 feet: 0 sec.   Gait steady and fast without use of assistive device  Cognitive Function: not indicated; patient is cogitatively intact.        Immunizations Immunization History  Administered Date(s) Administered  . Fluad Quad(high Dose 65+) 10/18/2018  . Influenza Split 10/31/2011  . Influenza Whole 12/26/2008  . Influenza, High Dose Seasonal PF 12/17/2014, 11/28/2016, 11/13/2017  . Influenza-Unspecified 11/07/2012, 10/28/2013, 11/30/2015  . PFIZER SARS-COV-2 Vaccination 02/20/2019, 03/12/2019  . Pneumococcal Conjugate-13 01/09/2015  . Pneumococcal Polysaccharide-23 02/02/2016  . Tetanus 10/21/2013  . Zoster 11/11/2013    TDAP status: Up to date Flu Vaccine status: Up to date Pneumococcal vaccine status: Up to date Covid-19 vaccine status: Completed vaccines  Qualifies for Shingles Vaccine? Yes   Zostavax completed Yes   Shingrix Completed?: No.    Education has been provided regarding the importance of this vaccine. Patient has been advised to call insurance company to determine out of pocket expense if they have not yet received this vaccine. Advised may also receive vaccine at local pharmacy or Health Dept. Verbalized acceptance and understanding.  Screening  Tests Health Maintenance  Topic Date Due  . Hepatitis C Screening  Never done  . INFLUENZA VACCINE  09/08/2019  . TETANUS/TDAP  10/22/2023  . COVID-19 Vaccine  Completed  . PNA vac Low Risk Adult  Completed  Health Maintenance  Health Maintenance Due  Topic Date Due  . Hepatitis C Screening  Never done    Colorectal cancer screening: No longer required.   Lung Cancer Screening: (Low Dose CT Chest recommended if Age 39-80 years, 30 pack-year currently smoking OR have quit w/in 15years.) does not qualify.   Lung Cancer Screening Referral: no  Additional Screening:  Hepatitis C Screening: does not qualify; Completed no  Vision Screening: Recommended annual ophthalmology exams for early detection of glaucoma and other disorders of the eye. Is the patient up to date with their annual eye exam?  Yes  Who is the provider or what is the name of the office in which the patient attends annual eye exams? [facility] If pt is not established with a provider, would they like to be referred to a provider to establish care? No .   Dental Screening: Recommended annual dental exams for proper oral hygiene  Community Resource Referral / Chronic Care Management: CRR required this visit?  No   CCM required this visit?  No      Plan:     I have personally reviewed and noted the following in the patient's chart:   . Medical and social history . Use of alcohol, tobacco or illicit drugs  . Current medications and supplements . Functional ability and status . Nutritional status . Physical activity . Advanced directives . List of other physicians . Hospitalizations, surgeries, and ER visits in previous 12 months . Vitals . Screenings to include cognitive, depression, and falls . Referrals and appointments  In addition, I have reviewed and discussed with patient certain preventive protocols, quality metrics, and best practice recommendations. A written personalized care plan for  preventive services as well as general preventive health recommendations were provided to patient.     Sheral Flow, LPN   07/13/2945   Nurse Notes:  Patient is cogitatively intact. There were no vitals filed for this visit. There is no height or weight on file to calculate BMI. Patient stated that he has no issues with gait or balance; does not use any assistive devices.

## 2019-08-14 NOTE — Patient Instructions (Signed)
Miguel Joseph , Thank you for taking time to come for your Medicare Wellness Visit. I appreciate your ongoing commitment to your health goals. Please review the following plan we discussed and let me know if I can assist you in the future.   Screening recommendations/referrals: Colonoscopy: not a candidate for colon cancer screening due to age Recommended yearly ophthalmology/optometry visit for glaucoma screening and checkup Recommended yearly dental visit for hygiene and checkup  Vaccinations: Influenza vaccine: 10/18/2018 Pneumococcal vaccine: completed Tdap vaccine: 2015; due every 10 years Shingles vaccine: never done   Covid-19: completed  Advanced directives: Please bring a copy of your health care power of attorney and living will to the office at your convenience.  Conditions/risks identified: Please continue to do your personal lifestyle choices by: daily care of teeth and gums, regular physical activity (goal should be 5 days a week for 30 minutes), eat a healthy diet, avoid tobacco and drug use, limiting any alcohol intake, taking a low-dose aspirin (if not allergic or have been advised by your provider otherwise) and taking vitamins and minerals as recommended by your provider. Continue doing brain stimulating activities (puzzles, reading, adult coloring books, staying active) to keep memory sharp. Continue to eat heart healthy diet (full of fruits, vegetables, whole grains, lean protein, water--limit salt, fat, and sugar intake) and increase physical activity as tolerated.  Next appointment: Please schedule your 1 year Medicare Wellness Visit with your Health Coach.  Preventive Care 66 Years and Older, Male Preventive care refers to lifestyle choices and visits with your health care provider that can promote health and wellness. What does preventive care include?  A yearly physical exam. This is also called an annual well check.  Dental exams once or twice a year.  Routine eye  exams. Ask your health care provider how often you should have your eyes checked.  Personal lifestyle choices, including:  Daily care of your teeth and gums.  Regular physical activity.  Eating a healthy diet.  Avoiding tobacco and drug use.  Limiting alcohol use.  Practicing safe sex.  Taking low doses of aspirin every day.  Taking vitamin and mineral supplements as recommended by your health care provider. What happens during an annual well check? The services and screenings done by your health care provider during your annual well check will depend on your age, overall health, lifestyle risk factors, and family history of disease. Counseling  Your health care provider may ask you questions about your:  Alcohol use.  Tobacco use.  Drug use.  Emotional well-being.  Home and relationship well-being.  Sexual activity.  Eating habits.  History of falls.  Memory and ability to understand (cognition).  Work and work Statistician. Screening  You may have the following tests or measurements:  Height, weight, and BMI.  Blood pressure.  Lipid and cholesterol levels. These may be checked every 5 years, or more frequently if you are over 20 years old.  Skin check.  Lung cancer screening. You may have this screening every year starting at age 32 if you have a 30-pack-year history of smoking and currently smoke or have quit within the past 15 years.  Fecal occult blood test (FOBT) of the stool. You may have this test every year starting at age 53.  Flexible sigmoidoscopy or colonoscopy. You may have a sigmoidoscopy every 5 years or a colonoscopy every 10 years starting at age 28.  Prostate cancer screening. Recommendations will vary depending on your family history and other risks.  Hepatitis  C blood test.  Hepatitis B blood test.  Sexually transmitted disease (STD) testing.  Diabetes screening. This is done by checking your blood sugar (glucose) after you have  not eaten for a while (fasting). You may have this done every 1-3 years.  Abdominal aortic aneurysm (AAA) screening. You may need this if you are a current or former smoker.  Osteoporosis. You may be screened starting at age 44 if you are at high risk. Talk with your health care provider about your test results, treatment options, and if necessary, the need for more tests. Vaccines  Your health care provider may recommend certain vaccines, such as:  Influenza vaccine. This is recommended every year.  Tetanus, diphtheria, and acellular pertussis (Tdap, Td) vaccine. You may need a Td booster every 10 years.  Zoster vaccine. You may need this after age 54.  Pneumococcal 13-valent conjugate (PCV13) vaccine. One dose is recommended after age 41.  Pneumococcal polysaccharide (PPSV23) vaccine. One dose is recommended after age 73. Talk to your health care provider about which screenings and vaccines you need and how often you need them. This information is not intended to replace advice given to you by your health care provider. Make sure you discuss any questions you have with your health care provider. Document Released: 02/20/2015 Document Revised: 10/14/2015 Document Reviewed: 11/25/2014 Elsevier Interactive Patient Education  2017 [place] Prevention in the Home Falls can cause injuries. They can happen to people of all ages. There are many things you can do to make your home safe and to help prevent falls. What can I do on the outside of my home?  Regularly fix the edges of walkways and driveways and fix any cracks.  Remove anything that might make you trip as you walk through a door, such as a raised step or threshold.  Trim any bushes or trees on the path to your home.  Use bright outdoor lighting.  Clear any walking paths of anything that might make someone trip, such as rocks or tools.  Regularly check to see if handrails are loose or broken. Make sure that both  sides of any steps have handrails.  Any raised decks and porches should have guardrails on the edges.  Have any leaves, snow, or ice cleared regularly.  Use sand or salt on walking paths during winter.  Clean up any spills in your garage right away. This includes oil or grease spills. What can I do in the bathroom?  Use night lights.  Install grab bars by the toilet and in the tub and shower. Do not use towel bars as grab bars.  Use non-skid mats or decals in the tub or shower.  If you need to sit down in the shower, use a plastic, non-slip stool.  Keep the floor dry. Clean up any water that spills on the floor as soon as it happens.  Remove soap buildup in the tub or shower regularly.  Attach bath mats securely with double-sided non-slip rug tape.  Do not have throw rugs and other things on the floor that can make you trip. What can I do in the bedroom?  Use night lights.  Make sure that you have a light by your bed that is easy to reach.  Do not use any sheets or blankets that are too big for your bed. They should not hang down onto the floor.  Have a firm chair that has side arms. You can use this for support while you get  dressed.  Do not have throw rugs and other things on the floor that can make you trip. What can I do in the kitchen?  Clean up any spills right away.  Avoid walking on wet floors.  Keep items that you use a lot in easy-to-reach places.  If you need to reach something above you, use a strong step stool that has a grab bar.  Keep electrical cords out of the way.  Do not use floor polish or wax that makes floors slippery. If you must use wax, use non-skid floor wax.  Do not have throw rugs and other things on the floor that can make you trip. What can I do with my stairs?  Do not leave any items on the stairs.  Make sure that there are handrails on both sides of the stairs and use them. Fix handrails that are broken or loose. Make sure that  handrails are as long as the stairways.  Check any carpeting to make sure that it is firmly attached to the stairs. Fix any carpet that is loose or worn.  Avoid having throw rugs at the top or bottom of the stairs. If you do have throw rugs, attach them to the floor with carpet tape.  Make sure that you have a light switch at the top of the stairs and the bottom of the stairs. If you do not have them, ask someone to add them for you. What else can I do to help prevent falls?  Wear shoes that:  Do not have high heels.  Have rubber bottoms.  Are comfortable and fit you well.  Are closed at the toe. Do not wear sandals.  If you use a stepladder:  Make sure that it is fully opened. Do not climb a closed stepladder.  Make sure that both sides of the stepladder are locked into place.  Ask someone to hold it for you, if possible.  Clearly mark and make sure that you can see:  Any grab bars or handrails.  First and last steps.  Where the edge of each step is.  Use tools that help you move around (mobility aids) if they are needed. These include:  Canes.  Walkers.  Scooters.  Crutches.  Turn on the lights when you go into a dark area. Replace any light bulbs as soon as they burn out.  Set up your furniture so you have a clear path. Avoid moving your furniture around.  If any of your floors are uneven, fix them.  If there are any pets around you, be aware of where they are.  Review your medicines with your doctor. Some medicines can make you feel dizzy. This can increase your chance of falling. Ask your doctor what other things that you can do to help prevent falls. This information is not intended to replace advice given to you by your health care provider. Make sure you discuss any questions you have with your health care provider. Document Released: 11/20/2008 Document Revised: 07/02/2015 Document Reviewed: 02/28/2014 Elsevier Interactive Patient Education  2017  [employer].

## 2019-08-16 ENCOUNTER — Telehealth: Payer: Self-pay | Admitting: *Deleted

## 2019-08-16 NOTE — Telephone Encounter (Signed)
Called patient to update, spoke with patient. 

## 2019-08-22 ENCOUNTER — Encounter: Payer: Self-pay | Admitting: Medical Oncology

## 2019-08-22 NOTE — Progress Notes (Signed)
Spoke with patient to introduce myself as the prostate nurse navigator and discuss my role. I was unable to meet him 7/6, when he consulted with Dr. Forrest Moron, PA. He has chosen brachytherapy as treatment and confirms CT simulation 8/12 @ 9:15.am. No barriers to care identified. I provided him with my contact information and asked him to call me with questions or concerns. He voiced understanding of the above.

## 2019-08-23 ENCOUNTER — Telehealth: Payer: Self-pay | Admitting: *Deleted

## 2019-08-23 ENCOUNTER — Other Ambulatory Visit: Payer: Self-pay | Admitting: Urology

## 2019-08-23 NOTE — Telephone Encounter (Signed)
CALLED PATIENT TO INFORM OF IMPLANT DATE, SPOKE WITH PATIENT AND HE IS AWARE OF THIS DATE 

## 2019-09-18 ENCOUNTER — Telehealth: Payer: Self-pay | Admitting: *Deleted

## 2019-09-18 NOTE — Telephone Encounter (Signed)
CALLED PATIENT TO REMIND OF PRE-SEED APPTS. FOR 09-19-19, LVM FOR A RETURN CALL

## 2019-09-19 ENCOUNTER — Encounter (HOSPITAL_COMMUNITY)
Admission: RE | Admit: 2019-09-19 | Discharge: 2019-09-19 | Disposition: A | Discharge status: Home / Self Care | Payer: Medicare Other | Source: Ambulatory Visit | Attending: Urology | Admitting: Urology

## 2019-09-19 ENCOUNTER — Ambulatory Visit
Admission: RE | Admit: 2019-09-19 | Discharge: 2019-09-19 | Disposition: A | Discharge status: Home / Self Care | Payer: Medicare Other | Source: Ambulatory Visit | Attending: Urology | Admitting: Urology

## 2019-09-19 ENCOUNTER — Other Ambulatory Visit: Payer: Self-pay

## 2019-09-19 ENCOUNTER — Ambulatory Visit
Admission: RE | Admit: 2019-09-19 | Discharge: 2019-09-19 | Disposition: A | Discharge status: Home / Self Care | Payer: Medicare Other | Source: Ambulatory Visit | Attending: Radiation Oncology | Admitting: Radiation Oncology

## 2019-09-19 ENCOUNTER — Encounter: Payer: Self-pay | Admitting: Medical Oncology

## 2019-09-19 ENCOUNTER — Ambulatory Visit (HOSPITAL_COMMUNITY)
Admission: RE | Admit: 2019-09-19 | Discharge: 2019-09-19 | Disposition: A | Discharge status: Home / Self Care | Payer: Medicare Other | Source: Ambulatory Visit | Attending: Urology | Admitting: Urology

## 2019-09-19 DIAGNOSIS — C61 Malignant neoplasm of prostate: Secondary | ICD-10-CM

## 2019-09-19 DIAGNOSIS — Z510 Encounter for antineoplastic radiation therapy: Secondary | ICD-10-CM | POA: Insufficient documentation

## 2019-09-22 NOTE — Progress Notes (Signed)
  Radiation Oncology         (336) [phone] ________________________________  Name: BEECHER FURIO MRN: [id number]  Date: 09/19/2019  DOB: January 26, 1941  SIMULATION AND TREATMENT PLANNING NOTE PUBIC ARCH STUDY  WE:RXVQM, Claudina Lick, MD  Davis Gourd*  DIAGNOSIS: 79 y.o. gentleman with Stage T1c adenocarcinoma of the prostate with Gleason score of 4+3, and PSA of 12.24  Oncology History  Malignant neoplasm of prostate ([place])  06/28/2019 Cancer Staging   Staging form: Prostate, AJCC 8th Edition - Clinical stage from 06/28/2019: Stage IIC (cT1c, cN0, cM0, PSA: 12.2, Grade Group: 3) - Signed by Freeman Caldron, PA-C on 08/13/2019   08/13/2019 Initial Diagnosis   Malignant neoplasm of prostate ([place])       ICD-10-CM   1. Malignant neoplasm of prostate ([place])  C61     COMPLEX SIMULATION:  The patient presented today for evaluation for possible prostate seed implant. He was brought to the radiation planning suite and placed supine on the CT couch. A 3-dimensional image study set was obtained in upload to the planning computer. There, on each axial slice, I contoured the prostate gland. Then, using three-dimensional radiation planning tools I reconstructed the prostate in view of the structures from the transperineal needle pathway to assess for possible pubic arch interference. In doing so, I did not appreciate any pubic arch interference. Also, the patient's prostate volume was estimated based on the drawn structure. The volume was 53 cc.  Given the pubic arch appearance and prostate volume, patient remains a good candidate to proceed with prostate seed implant. Today, he freely provided informed written consent to proceed.    PLAN: The patient will undergo prostate seed implant.   ________________________________  Sheral Apley. Tammi Klippel, M.D.

## 2019-10-07 ENCOUNTER — Other Ambulatory Visit: Payer: Self-pay | Admitting: Urology

## 2019-10-08 ENCOUNTER — Telehealth: Payer: Self-pay | Admitting: *Deleted

## 2019-10-08 NOTE — Telephone Encounter (Signed)
CALLED PATIENT TO REMIND OF LAB AND COVID TESTING FOR 10-15-19, SPOKE WITH PATIENT AND HE IS AWARE OF THESE APPTS.

## 2019-10-10 ENCOUNTER — Other Ambulatory Visit: Payer: Self-pay

## 2019-10-10 ENCOUNTER — Encounter (HOSPITAL_BASED_OUTPATIENT_CLINIC_OR_DEPARTMENT_OTHER): Payer: Self-pay | Admitting: Urology

## 2019-10-10 NOTE — Progress Notes (Signed)
Spoke w/ via phone for pre-op interview---pt Lab needs dos----    Has lab appt 10-15-2019 at 900 am for cbc, cmet, pt, ptt          Lab results------ekg 09-19-2019 epic, chest xray 09-19-2019 epic COVID test ------10-15-2019 at 1010 am Arrive at -------630 am 10-16-2019 NPO after MN NO Solid Food.  Clear liquids from MN until---530 am then npo Medications to take morning of surgery ----pravastatin Diabetic medication -----n/a Patient Special Instructions -----fleets enema am of surgery Pre-Op special Istructions -----none Patient verbalized understanding of instructions that were given at this phone interview. Patient denies shortness of breath, chest pain, fever, cough at this phone interview.

## 2019-10-15 ENCOUNTER — Other Ambulatory Visit (HOSPITAL_COMMUNITY)
Admission: RE | Admit: 2019-10-15 | Discharge: 2019-10-15 | Disposition: A | Discharge status: Home / Self Care | Payer: Medicare Other | Source: Ambulatory Visit | Attending: Urology | Admitting: Urology

## 2019-10-15 ENCOUNTER — Encounter (HOSPITAL_COMMUNITY)
Admission: RE | Admit: 2019-10-15 | Discharge: 2019-10-15 | Disposition: A | Discharge status: Home / Self Care | Payer: Medicare Other | Source: Ambulatory Visit | Attending: Urology | Admitting: Urology

## 2019-10-15 ENCOUNTER — Other Ambulatory Visit: Payer: Self-pay

## 2019-10-15 ENCOUNTER — Telehealth: Payer: Self-pay | Admitting: *Deleted

## 2019-10-15 DIAGNOSIS — Z01812 Encounter for preprocedural laboratory examination: Secondary | ICD-10-CM | POA: Insufficient documentation

## 2019-10-15 DIAGNOSIS — Z20822 Contact with and (suspected) exposure to covid-19: Secondary | ICD-10-CM | POA: Insufficient documentation

## 2019-10-15 LAB — CBC
HCT: 45.2 % (ref 39.0–52.0)
Hemoglobin: 15.1 g/dL (ref 13.0–17.0)
MCH: 31.5 pg (ref 26.0–34.0)
MCHC: 33.4 g/dL (ref 30.0–36.0)
MCV: 94.4 fL (ref 80.0–100.0)
Platelets: 194 10*3/uL (ref 150–400)
RBC: 4.79 MIL/uL (ref 4.22–5.81)
RDW: 14.1 % (ref 11.5–15.5)
WBC: 6.6 10*3/uL (ref 4.0–10.5)
nRBC: 0 % (ref 0.0–0.2)

## 2019-10-15 LAB — COMPREHENSIVE METABOLIC PANEL
ALT: 27 U/L (ref 0–44)
AST: 27 U/L (ref 15–41)
Albumin: 4.2 g/dL (ref 3.5–5.0)
Alkaline Phosphatase: 40 U/L (ref 38–126)
Anion gap: 11 (ref 5–15)
BUN: 22 mg/dL (ref 8–23)
CO2: 25 mmol/L (ref 22–32)
Calcium: 9.5 mg/dL (ref 8.9–10.3)
Chloride: 103 mmol/L (ref 98–111)
Creatinine, Ser: 1.51 mg/dL — ABNORMAL HIGH (ref 0.61–1.24)
GFR calc Af Amer: 50 mL/min — ABNORMAL LOW (ref >60)
GFR calc non Af Amer: 43 mL/min — ABNORMAL LOW (ref >60)
Glucose, Bld: 87 mg/dL (ref 70–99)
Potassium: 4.6 mmol/L (ref 3.5–5.1)
Sodium: 139 mmol/L (ref 135–145)
Total Bilirubin: 0.9 mg/dL (ref 0.3–1.2)
Total Protein: 7 g/dL (ref 6.5–8.1)

## 2019-10-15 LAB — APTT: aPTT: 39 seconds — ABNORMAL HIGH (ref 24–36)

## 2019-10-15 LAB — SARS CORONAVIRUS 2 (TAT 6-24 HRS): SARS Coronavirus 2: NEGATIVE

## 2019-10-15 LAB — PROTIME-INR
INR: 1.1 (ref 0.8–1.2)
Prothrombin Time: 13.6 seconds (ref 11.4–15.2)

## 2019-10-15 NOTE — Telephone Encounter (Signed)
Called patient to remind of procedure for 10-16-19, spoke with patient and he is aware of this procedure

## 2019-10-16 ENCOUNTER — Ambulatory Visit (HOSPITAL_BASED_OUTPATIENT_CLINIC_OR_DEPARTMENT_OTHER)
Admission: RE | Admit: 2019-10-16 | Discharge: 2019-10-16 | Disposition: A | Discharge status: Home / Self Care | Payer: Medicare Other | Source: Ambulatory Visit | Attending: Urology | Admitting: Urology

## 2019-10-16 ENCOUNTER — Ambulatory Visit (HOSPITAL_BASED_OUTPATIENT_CLINIC_OR_DEPARTMENT_OTHER): Payer: Medicare Other | Admitting: Anesthesiology

## 2019-10-16 ENCOUNTER — Encounter (HOSPITAL_BASED_OUTPATIENT_CLINIC_OR_DEPARTMENT_OTHER): Payer: Self-pay | Admitting: Urology

## 2019-10-16 ENCOUNTER — Ambulatory Visit (HOSPITAL_COMMUNITY): Payer: Medicare Other

## 2019-10-16 ENCOUNTER — Encounter (HOSPITAL_BASED_OUTPATIENT_CLINIC_OR_DEPARTMENT_OTHER)
Admission: RE | Disposition: A | Discharge status: Home / Self Care | Payer: Self-pay | Source: Ambulatory Visit | Attending: Urology

## 2019-10-16 DIAGNOSIS — N183 Chronic kidney disease, stage 3 unspecified: Secondary | ICD-10-CM | POA: Insufficient documentation

## 2019-10-16 DIAGNOSIS — Z8043 Family history of malignant neoplasm of testis: Secondary | ICD-10-CM | POA: Diagnosis not present

## 2019-10-16 DIAGNOSIS — Z8041 Family history of malignant neoplasm of ovary: Secondary | ICD-10-CM | POA: Diagnosis not present

## 2019-10-16 DIAGNOSIS — C61 Malignant neoplasm of prostate: Secondary | ICD-10-CM | POA: Diagnosis not present

## 2019-10-16 DIAGNOSIS — Z803 Family history of malignant neoplasm of breast: Secondary | ICD-10-CM | POA: Diagnosis not present

## 2019-10-16 DIAGNOSIS — Z8371 Family history of colonic polyps: Secondary | ICD-10-CM | POA: Insufficient documentation

## 2019-10-16 DIAGNOSIS — Z800 Family history of malignant neoplasm of digestive organs: Secondary | ICD-10-CM | POA: Diagnosis not present

## 2019-10-16 DIAGNOSIS — E785 Hyperlipidemia, unspecified: Secondary | ICD-10-CM | POA: Diagnosis not present

## 2019-10-16 DIAGNOSIS — Z87440 Personal history of urinary (tract) infections: Secondary | ICD-10-CM | POA: Diagnosis not present

## 2019-10-16 DIAGNOSIS — N400 Enlarged prostate without lower urinary tract symptoms: Secondary | ICD-10-CM | POA: Diagnosis not present

## 2019-10-16 DIAGNOSIS — Z8249 Family history of ischemic heart disease and other diseases of the circulatory system: Secondary | ICD-10-CM | POA: Insufficient documentation

## 2019-10-16 HISTORY — DX: Personal history of other diseases of the digestive system: Z87.19

## 2019-10-16 HISTORY — DX: Urinary tract infection, site not specified: N39.0

## 2019-10-16 HISTORY — PX: RADIOACTIVE SEED IMPLANT: SHX5150

## 2019-10-16 SURGERY — INSERTION, RADIATION SOURCE, PROSTATE
Anesthesia: General

## 2019-10-16 MED ORDER — SODIUM CHLORIDE 0.9 % IV SOLN
INTRAVENOUS | Status: AC | PRN
  Administered 2019-10-16: 1000 mL

## 2019-10-16 MED ORDER — SODIUM CHLORIDE 0.9 % IV SOLN: INTRAVENOUS | Status: DC

## 2019-10-16 MED ORDER — EPHEDRINE 5 MG/ML INJ
INTRAVENOUS | Status: AC
  Filled 2019-10-16: qty 10

## 2019-10-16 MED ORDER — PHENYLEPHRINE HCL (PRESSORS) 10 MG/ML IV SOLN
INTRAVENOUS | Status: AC
  Filled 2019-10-16: qty 1

## 2019-10-16 MED ORDER — OXYBUTYNIN CHLORIDE 5 MG PO TABS: 5.0000 mg | ORAL_TABLET | Freq: Three times a day (TID) | ORAL | 1 refills | Status: DC | PRN

## 2019-10-16 MED ORDER — ONDANSETRON HCL 4 MG/2ML IJ SOLN
INTRAMUSCULAR | Status: DC | PRN
  Administered 2019-10-16: 4 mg via INTRAVENOUS

## 2019-10-16 MED ORDER — PHENAZOPYRIDINE HCL 200 MG PO TABS: 200.0000 mg | ORAL_TABLET | Freq: Three times a day (TID) | ORAL | 0 refills | Status: DC | PRN

## 2019-10-16 MED ORDER — FENTANYL CITRATE (PF) 100 MCG/2ML IJ SOLN
INTRAMUSCULAR | Status: DC | PRN
Start: 2019-10-16 — End: 2019-10-16
  Administered 2019-10-16 (×2): 50 ug via INTRAVENOUS

## 2019-10-16 MED ORDER — GLYCOPYRROLATE PF 0.2 MG/ML IJ SOSY
PREFILLED_SYRINGE | INTRAMUSCULAR | Status: AC
  Filled 2019-10-16: qty 1

## 2019-10-16 MED ORDER — STERILE WATER FOR IRRIGATION IR SOLN
Status: DC | PRN
  Administered 2019-10-16: 3 mL

## 2019-10-16 MED ORDER — ONDANSETRON HCL 4 MG/2ML IJ SOLN
INTRAMUSCULAR | Status: AC
  Filled 2019-10-16: qty 2

## 2019-10-16 MED ORDER — GENTAMICIN SULFATE 40 MG/ML IJ SOLN: 5.0000 mg/kg | Freq: Once | INTRAVENOUS | Status: DC

## 2019-10-16 MED ORDER — PHENYLEPHRINE 40 MCG/ML (10ML) SYRINGE FOR IV PUSH (FOR BLOOD PRESSURE SUPPORT)
PREFILLED_SYRINGE | INTRAVENOUS | Status: DC | PRN
  Administered 2019-10-16: 120 ug via INTRAVENOUS
  Administered 2019-10-16: 160 ug via INTRAVENOUS
  Administered 2019-10-16: 120 ug via INTRAVENOUS

## 2019-10-16 MED ORDER — EPHEDRINE SULFATE-NACL 50-0.9 MG/10ML-% IV SOSY
PREFILLED_SYRINGE | INTRAVENOUS | Status: DC | PRN
  Administered 2019-10-16: 10 mg via INTRAVENOUS

## 2019-10-16 MED ORDER — FENTANYL CITRATE (PF) 100 MCG/2ML IJ SOLN: 25.0000 ug | INTRAMUSCULAR | Status: DC | PRN

## 2019-10-16 MED ORDER — FENTANYL CITRATE (PF) 100 MCG/2ML IJ SOLN
INTRAMUSCULAR | Status: AC
  Filled 2019-10-16: qty 2

## 2019-10-16 MED ORDER — ACETAMINOPHEN 500 MG PO TABS
1000.0000 mg | ORAL_TABLET | Freq: Once | ORAL | Status: AC
  Administered 2019-10-16: 1000 mg via ORAL

## 2019-10-16 MED ORDER — GENTAMICIN SULFATE 40 MG/ML IJ SOLN
5.0000 mg/kg | Freq: Once | INTRAVENOUS | Status: AC
  Administered 2019-10-16: 360 mg via INTRAVENOUS
  Filled 2019-10-16: qty 9

## 2019-10-16 MED ORDER — ACETAMINOPHEN 500 MG PO TABS
ORAL_TABLET | ORAL | Status: AC
  Filled 2019-10-16: qty 2

## 2019-10-16 MED ORDER — CIPROFLOXACIN IN D5W 400 MG/200ML IV SOLN
INTRAVENOUS | Status: AC
  Filled 2019-10-16: qty 200

## 2019-10-16 MED ORDER — SODIUM CHLORIDE (PF) 0.9 % IJ SOLN
INTRAMUSCULAR | Status: DC | PRN
  Administered 2019-10-16: 10 mL

## 2019-10-16 MED ORDER — FLEET ENEMA 7-19 GM/118ML RE ENEM: 1.0000 | ENEMA | Freq: Once | RECTAL | Status: DC

## 2019-10-16 MED ORDER — PHENYLEPHRINE 40 MCG/ML (10ML) SYRINGE FOR IV PUSH (FOR BLOOD PRESSURE SUPPORT)
PREFILLED_SYRINGE | INTRAVENOUS | Status: AC
  Filled 2019-10-16: qty 10

## 2019-10-16 MED ORDER — IOHEXOL 300 MG/ML  SOLN
INTRAMUSCULAR | Status: DC | PRN
  Administered 2019-10-16: 7 mL

## 2019-10-16 MED ORDER — PROPOFOL 10 MG/ML IV BOLUS
INTRAVENOUS | Status: DC | PRN
  Administered 2019-10-16: 50 mg via INTRAVENOUS
  Administered 2019-10-16: 150 mg via INTRAVENOUS
  Administered 2019-10-16: 100 mg via INTRAVENOUS

## 2019-10-16 MED ORDER — PROPOFOL 10 MG/ML IV BOLUS
INTRAVENOUS | Status: AC
  Filled 2019-10-16: qty 40

## 2019-10-16 MED ORDER — LIDOCAINE 2% (20 MG/ML) 5 ML SYRINGE
INTRAMUSCULAR | Status: DC | PRN
  Administered 2019-10-16: 100 mg via INTRAVENOUS

## 2019-10-16 MED ORDER — DEXAMETHASONE SODIUM PHOSPHATE 10 MG/ML IJ SOLN
INTRAMUSCULAR | Status: AC
  Filled 2019-10-16: qty 1

## 2019-10-16 MED ORDER — CIPROFLOXACIN IN D5W 400 MG/200ML IV SOLN
400.0000 mg | INTRAVENOUS | Status: AC
  Administered 2019-10-16: 400 mg via INTRAVENOUS

## 2019-10-16 MED ORDER — PHENYLEPHRINE HCL-NACL 10-0.9 MG/250ML-% IV SOLN
INTRAVENOUS | Status: DC | PRN
  Administered 2019-10-16: 50 ug/min via INTRAVENOUS

## 2019-10-16 MED ORDER — LIDOCAINE 2% (20 MG/ML) 5 ML SYRINGE
INTRAMUSCULAR | Status: AC
  Filled 2019-10-16: qty 5

## 2019-10-16 MED ORDER — GLYCOPYRROLATE 0.2 MG/ML IJ SOLN
INTRAMUSCULAR | Status: DC | PRN
  Administered 2019-10-16: .1 mg via INTRAVENOUS

## 2019-10-16 MED ORDER — DEXAMETHASONE SODIUM PHOSPHATE 10 MG/ML IJ SOLN
INTRAMUSCULAR | Status: DC | PRN
  Administered 2019-10-16: 5 mg via INTRAVENOUS

## 2019-10-16 SURGICAL SUPPLY — 36 items
BAG DRN RND TRDRP ANRFLXCHMBR (UROLOGICAL SUPPLIES) ×1
BAG URINE DRAIN 2000ML AR STRL (UROLOGICAL SUPPLIES) ×2 IMPLANT
BLADE CLIPPER SENSICLIP SURGIC (BLADE) ×2 IMPLANT
CATH FOLEY 2WAY SLVR  5CC 16FR (CATHETERS) ×2
CATH FOLEY 2WAY SLVR 5CC 16FR (CATHETERS) ×1 IMPLANT
CATH ROBINSON RED A/P 16FR (CATHETERS) IMPLANT
CATH ROBINSON RED A/P 20FR (CATHETERS) ×2 IMPLANT
CLOTH BEACON ORANGE TIMEOUT ST (SAFETY) ×2 IMPLANT
CNTNR URN SCR LID CUP LEK RST (MISCELLANEOUS) ×2 IMPLANT
CONT SPEC 4OZ STRL OR WHT (MISCELLANEOUS) ×4
COVER BACK TABLE 60X90IN (DRAPES) ×2 IMPLANT
COVER MAYO STAND STRL (DRAPES) ×2 IMPLANT
DRAPE C-ARM 35X43 STRL (DRAPES) ×2 IMPLANT
DRSG TEGADERM 4X4.75 (GAUZE/BANDAGES/DRESSINGS) ×2 IMPLANT
DRSG TEGADERM 8X12 (GAUZE/BANDAGES/DRESSINGS) ×2 IMPLANT
GAUZE SPONGE 4X4 12PLY STRL (GAUZE/BANDAGES/DRESSINGS) ×2 IMPLANT
GLOVE BIO SURGEON STRL SZ 6.5 (GLOVE) ×2 IMPLANT
GLOVE BIO SURGEON STRL SZ7.5 (GLOVE) ×2 IMPLANT
GLOVE BIO SURGEON STRL SZ8 (GLOVE) IMPLANT
GLOVE SURG ORTHO 8.5 STRL (GLOVE) IMPLANT
GLOVE SURG SS PI 6.5 STRL IVOR (GLOVE) ×2 IMPLANT
GOWN STRL REUS W/TWL LRG LVL3 (GOWN DISPOSABLE) ×2 IMPLANT
HOLDER FOLEY CATH W/STRAP (MISCELLANEOUS) IMPLANT
I Seed AGX100 ×140 IMPLANT
IMPL SPACEOAR VUE SYSTEM (Spacer) ×1 IMPLANT
IMPLANT SPACEOAR VUE SYSTEM (Spacer) ×2 IMPLANT
IV NS 1000ML (IV SOLUTION) ×2
IV NS 1000ML BAXH (IV SOLUTION) ×1 IMPLANT
KIT TURNOVER CYSTO (KITS) ×2 IMPLANT
MARKER SKIN DUAL TIP RULER LAB (MISCELLANEOUS) ×2 IMPLANT
PACK CYSTO (CUSTOM PROCEDURE TRAY) ×2 IMPLANT
SUT BONE WAX W31G (SUTURE) IMPLANT
SYR 10ML LL (SYRINGE) ×2 IMPLANT
TOWEL OR 17X26 10 PK STRL BLUE (TOWEL DISPOSABLE) ×2 IMPLANT
UNDERPAD 30X36 HEAVY ABSORB (UNDERPADS AND DIAPERS) ×4 IMPLANT
WATER STERILE IRR 500ML POUR (IV SOLUTION) ×2 IMPLANT

## 2019-10-16 NOTE — Op Note (Signed)
PATIENT:  Miguel Joseph  PRE-OPERATIVE DIAGNOSIS:  Adenocarcinoma of the prostate  POST-OPERATIVE DIAGNOSIS:  Same  PROCEDURE:  1. I-125 radioactive seed implantation 2. Cystoscopy  3. Placement of SpaceOAR  SURGEON:  Ellison Hughs, MD  Radiation oncologist: Tyler Pita, MD  ANESTHESIA:  General  EBL:  Minimal  DRAINS: None  INDICATION: [name] is a 79 year old male with grade 3 prostate cancer.  He is here today for brachytherapy seed placement as definitive treatment of his prostate cancer.  He has been consented for the above procedures, voices understanding and wishes to proceed.  Description of procedure: After informed consent the patient was brought to the major OR, placed on the table and administered general anesthesia. He was then moved to the modified lithotomy position with his perineum perpendicular to the floor. His perineum and genitalia were then sterilely prepped. An official timeout was then performed. A 16 French Foley catheter was then placed in the bladder and filled with dilute contrast, a rectal tube was placed in the rectum and the transrectal ultrasound probe was placed in the rectum and affixed to the stand. He was then sterilely draped.  Real time ultrasonography was used along with the seed planning software. This was used to develop the seed plan including the number of needles as well as number of seeds required for complete and adequate coverage. Real-time ultrasonography was then used along with the previously developed plan and the Nucletron device to implant a total of 70 seeds using 22 needles. This proceeded without difficulty or complication.   I then proceeded with placement of SpaceOAR by introducing a needle with the bevel angled inferiorly approximately 2 cm superior to the anus. This was angled downward and under direct ultrasound was placed within the space between the prostatic capsule and rectum. This was confirmed with a  small amount of sterile saline injected and this was performed under direct ultrasound. I then attached the SpaceOAR to the needle and injected this in the space between the prostate and rectum with good placement noted.  A Foley catheter was then removed as well as the transrectal ultrasound probe and rectal probe. Flexible cystoscopy was then performed using the 16 French flexible scope which revealed a normal urethra throughout its length down to the sphincter which appeared intact. The prostatic urethra revealed bilobar hypertrophy but no evidence of obstruction, seeds, spacers or lesions. The bladder was then entered and fully and systematically inspected. The ureteral orifices were noted to be of normal configuration and position. The mucosa revealed no evidence of tumors. There were also no stones identified within the bladder. I noted no seeds or spacers on the floor of the bladder and retroflexion of the scope revealed no seeds protruding from the base of the prostate.  The cystoscope was then removed and the patient was awakened and taken to recovery room in stable and satisfactory condition. He tolerated procedure well and there were no intraoperative complications.  Plan: Follow-up on 11/15/2019 for postoperative visit.

## 2019-10-16 NOTE — Progress Notes (Signed)
  Radiation Oncology         (336) [phone] ________________________________  Name: Miguel Joseph MRN: [id number]  Date: 10/16/2019  DOB: 06/27/1940       Prostate Seed Implant  CB:SWHQP, Claudina Lick, MD  No ref. provider found  DIAGNOSIS: 79 y.o. gentleman with Stage T1c adenocarcinoma of the prostate with Gleason score of 4+3, and PSA of 12.24  Oncology History  Malignant neoplasm of prostate ([place])  06/28/2019 Cancer Staging   Staging form: Prostate, AJCC 8th Edition - Clinical stage from 06/28/2019: Stage IIC (cT1c, cN0, cM0, PSA: 12.2, Grade Group: 3) - Signed by Freeman Caldron, PA-C on 08/13/2019   08/13/2019 Initial Diagnosis   Malignant neoplasm of prostate ([place])     No diagnosis found.  PROCEDURE: Insertion of radioactive I-125 seeds into the prostate gland.  RADIATION DOSE: 145 Gy, definitive therapy.  TECHNIQUE: [name] was brought to the operating room with the urologist. He was placed in the dorsolithotomy position. He was catheterized and a rectal tube was inserted. The perineum was shaved, prepped and draped. The ultrasound probe was then introduced into the rectum to see the prostate gland.  TREATMENT DEVICE: A needle grid was attached to the ultrasound probe stand and anchor needles were placed.  3D PLANNING: The prostate was imaged in 3D using a sagittal sweep of the prostate probe. These images were transferred to the planning computer. There, the prostate, urethra and rectum were defined on each axial reconstructed image. Then, the software created an optimized 3D plan and a few seed positions were adjusted. The quality of the plan was reviewed using [facility] information for the target and the following two organs at risk:  Urethra and Rectum.  Then the accepted plan was printed and handed off to the radiation therapist.  Under my supervision, the custom loading of the seeds and spacers was carried out and loaded into sealed vicryl sleeves.  These pre-loaded needles were  then placed into the needle holder.Marland Kitchen  PROSTATE VOLUME STUDY:  Using transrectal ultrasound the volume of the prostate was verified to be 52.7 cc.  SPECIAL TREATMENT PROCEDURE/SUPERVISION AND HANDLING: The pre-loaded needles were then delivered under sagittal guidance. A total of 22 needles were used to deposit 70 seeds in the prostate gland. The individual seed activity was 0.583 mCi.  SpaceOAR:  Yes  COMPLEX SIMULATION: At the end of the procedure, an anterior radiograph of the pelvis was obtained to document seed positioning and count. Cystoscopy was performed to check the urethra and bladder.  MICRODOSIMETRY: At the end of the procedure, the patient was emitting 0.118 mR/hr at 1 meter. Accordingly, he was considered safe for hospital discharge.  PLAN: The patient will return to the radiation oncology clinic for post implant CT dosimetry in three weeks.   ________________________________  Sheral Apley Tammi Klippel, M.D.

## 2019-10-16 NOTE — Transfer of Care (Signed)
Immediate Anesthesia Transfer of Care Note  Patient: Miguel Joseph  Procedure(s) Performed: RADIOACTIVE SEED IMPLANT/BRACHYTHERAPY IMPLANT (N/A )  Patient Location: PACU  Anesthesia Type:General  Level of Consciousness: sedated  Airway & Oxygen Therapy: Patient Spontanous Breathing and Patient connected to nasal cannula oxygen  Post-op Assessment: Report given to RN  Post vital signs: Reviewed and stable  Last Vitals:  Vitals Value Taken Time  BP 111/77 10/16/19 1015  Temp    Pulse 96 10/16/19 1016  Resp 29 10/16/19 1016  SpO2 90 % 10/16/19 1016  Vitals shown include unvalidated device data.  Last Pain:  Vitals:   10/16/19 0701  TempSrc: Oral  PainSc: 0-No pain      Patients Stated Pain Goal: 4 (88/64/84 7207)  Complications: No complications documented.

## 2019-10-16 NOTE — Anesthesia Preprocedure Evaluation (Addendum)
Anesthesia Evaluation  Patient identified by MRN, date of birth, ID band Patient awake    Reviewed: Allergy & Precautions, NPO status , Patient's Chart, lab work & pertinent test results  Airway Mallampati: I  TM Distance: >3 FB Neck ROM: Full    Dental no notable dental hx. 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)  negative genitourinary   Musculoskeletal negative musculoskeletal ROS (+)   Abdominal   Peds  Hematology negative hematology ROS (+)   Anesthesia Other Findings Prostate CA  Reproductive/Obstetrics                            Anesthesia Physical Anesthesia Plan  ASA: II  Anesthesia Plan: General   Post-op Pain Management:    Induction: Intravenous  PONV Risk Score and Plan: 2 and Ondansetron and Dexamethasone  Airway Management Planned: LMA  Additional Equipment:   Intra-op Plan:   Post-operative Plan: Extubation in OR  Informed Consent: I have reviewed the patients History and Physical, chart, labs and discussed the procedure including the risks, benefits and alternatives for the proposed anesthesia with the patient or authorized representative who has indicated his/her understanding and acceptance.     Dental advisory given  Plan Discussed with: CRNA  Anesthesia Plan Comments:         Anesthesia Quick Evaluation

## 2019-10-16 NOTE — Anesthesia Procedure Notes (Signed)
Procedure Name: LMA Insertion Date/Time: 10/16/2019 8:31 AM Performed by: Bonney Aid, CRNA Pre-anesthesia Checklist: Patient identified, Emergency Drugs available, Suction available and Patient being monitored Patient Re-evaluated:Patient Re-evaluated prior to induction Oxygen Delivery Method: Circle system utilized Preoxygenation: Pre-oxygenation with 100% oxygen Induction Type: IV induction Ventilation: Mask ventilation without difficulty LMA: LMA inserted LMA Size: 4.0 Number of attempts: 2 Airway Equipment and Method: Bite block Placement Confirmation: positive ETCO2 Tube secured with: Tape Dental Injury: Teeth and Oropharynx as per pre-operative assessment

## 2019-10-16 NOTE — H&P (Signed)
Urology Preoperative H&P   Chief Complaint: prostate cancer   History of Present Illness: Miguel Joseph is a 79 y.o. male with T1c, Grade 3 prostate cancer along with BPH w/LUTS and recurrent UTIs.   Last PSA: 12.24 (04/16/19). No family history of prostate cancer. The patient was seen by Dr. Diona Fanti last year with a PSA of 10.85. A prostate biopsy was recommended, but the patient did not follow through.  Biopsy Date: 06/28/19  TNM stage: T1c  Gleason score: 4+3  Left: 3 out of 6 cores positive  Right: NED  Prostate volume: 54 cm^3   He has met with Dr. Tammi Klippel and has decided to proceed with brachytherapy as primary treatment of his intermediate risk prostate cancer.   He was recently found to have a UTI with a urine culture showing mixed growth his urinary symptoms have since improved after starting Augmentin and Alfuzosin.     Past Medical History:  Diagnosis Date  . Chronic kidney disease    managed by Dr. Pearson Grippe. Stage III chronic kidney disease. Stable.  . Diverticulosis   . E. coli UTI (urinary tract infection) june 2021 and 2014   Rx: Cipro  . History of anal fissures yrs ago  . Hyperlipidemia   . Prostate cancer ([place])   . UTI (urinary tract infection)    on amoxicillian    Past Surgical History:  Procedure Laterality Date  . APPENDECTOMY  1963  . COLONOSCOPY  2001 &;2007;2016   internal hemorrhoids; Dr Fuller Plan  . PROSTATE BIOPSY  06/28/2019  . TONSILLECTOMY AND ADENOIDECTOMY  yrs ago   adenoids removed not sure about tonsils  . WISDOM TOOTH EXTRACTION      Allergies: No Known Allergies  Family History  Problem Relation Age of Onset  . Breast cancer Mother   . Ovarian cancer Mother   . Testicular cancer Father   . Breast cancer Sister   . Pancreatic cancer Paternal Uncle         X2   . Heart block Paternal Aunt        pacer  . Colon polyps Son 80       tubular adenoma  . Diabetes Neg Hx   . Stroke Neg Hx   . Heart disease Neg Hx   .  Hypertension Neg Hx   . Prostate cancer Neg Hx   . Colon cancer Neg Hx     Social History:  reports that he has never smoked. He has never used smokeless tobacco. He reports previous alcohol use of about 4.0 standard drinks of alcohol per week. He reports that he does not use drugs.  ROS: A complete review of systems was performed.  All systems are negative except for pertinent findings as noted.  Physical Exam:  Vital signs in last 24 hours: Temp:  [98 F (36.7 C)-98.3 F (36.8 C)] 98 F (36.7 C) (09/08 0701) Pulse Rate:  [71-76] 71 (09/08 0701) Resp:  [16] 16 (09/08 0701) BP: (128-143)/(66-75) 143/75 (09/08 0701) SpO2:  [97 %] 97 % (09/08 0701) Weight:  [73.5 kg-75.3 kg] 75.3 kg (09/08 0701) Constitutional:  Alert and oriented, No acute distress Cardiovascular: Regular rate and rhythm, No JVD Respiratory: Normal respiratory effort, Lungs clear bilaterally GI: Abdomen is soft, nontender, nondistended, no abdominal masses GU: No CVA tenderness Lymphatic: No lymphadenopathy Neurologic: Grossly intact, no focal deficits Psychiatric: Normal mood and affect  Laboratory Data:  Recent Labs    10/15/19 0852  WBC 6.6  HGB 15.1  HCT  45.2  PLT 194    Recent Labs    10/15/19 0852  NA 139  K 4.6  CL 103  GLUCOSE 87  BUN 22  CALCIUM 9.5  CREATININE 1.51*     Results for orders placed or performed during the hospital encounter of 10/15/19 (from the past 24 hour(s))  SARS CORONAVIRUS 2 (TAT 6-24 HRS) Nasopharyngeal Nasopharyngeal Swab     Status: None   Collection Time: 10/15/19 10:31 AM   Specimen: Nasopharyngeal Swab  Result Value Ref Range   SARS Coronavirus 2 NEGATIVE NEGATIVE   Recent Results (from the past 240 hour(s))  SARS CORONAVIRUS 2 (TAT 6-24 HRS) Nasopharyngeal Nasopharyngeal Swab     Status: None   Collection Time: 10/15/19 10:31 AM   Specimen: Nasopharyngeal Swab  Result Value Ref Range Status   SARS Coronavirus 2 NEGATIVE NEGATIVE Final    Comment:  (NOTE) SARS-CoV-2 target nucleic acids are NOT DETECTED.  The SARS-CoV-2 RNA is generally detectable in upper and lower respiratory specimens during the acute phase of infection. Negative results do not preclude SARS-CoV-2 infection, do not rule out co-infections with other pathogens, and should not be used as the sole basis for treatment or other patient management decisions. Negative results must be combined with clinical observations, patient history, and epidemiological information. The expected result is Negative.  Fact Sheet for Patients: SugarRoll.be  Fact Sheet for Healthcare Providers: https://www.woods-mathews.com/  This test is not yet approved or cleared by the [place] FDA and  has been authorized for detection and/or diagnosis of SARS-CoV-2 by FDA under an Emergency Use Authorization (EUA). This EUA will remain  in effect (meaning this test can be used) for the duration of the COVID-19 declaration under Se ction 564(b)(1) of the Act, 21 U.S.C. section 360bbb-3(b)(1), unless the authorization is terminated or revoked sooner.  Performed at [facility], [place] [address] [place], [place] [address]     Renal Function: Recent Labs    10/15/19 9622  CREATININE 1.51*   Estimated Creatinine Clearance: 38.4 mL/min (A) (by C-G formula based on SCr of 1.51 mg/dL (H)).  Radiologic Imaging: No results found.  I independently reviewed the above imaging studies.  Assessment and Plan Miguel Joseph is a 79 y.o. male with Grade 3 prostate cancer  -The patient was counseled about the natural history of prostate cancer and the standard treatment options that are available for prostate cancer. It was explained to him how his age and life expectancy, clinical stage, Gleason score, and PSA affect his prognosis, the decision to proceed with additional staging studies, as well as how that information influences recommended  treatment strategies. We discussed the roles for active surveillance, radiation therapy, surgical therapy, androgen deprivation, as well as ablative therapy options for the treatment of prostate cancer as appropriate to his individual cancer situation. We discussed the risks and benefits of these options with regard to their impact on cancer control and also in terms of potential adverse events, complications, and impact on quality of life particularly related to urinary and sexual function. The patient was encouraged to ask questions throughout the discussion today and all questions were answered to his stated satisfaction. In addition, the patient was provided with and/or directed to appropriate resources and literature for further education about prostate cancer and treatment options.   The patient has decided to proceed with cystoscopy, brachytherapy seed and SpaceOAR placement as primary treatment of his risk prostate cancer.  The risks, benefits and alternatives of the aforementioned  procedures was discussed in detail.  Risks include, bur are not limited to worsening LUTS, erectile dysfunction, rectal irritation, urethral stricture formation, fistula formation, cancer recurrence, MI, CVA, PE, DVT and the inherent risk of general anesthesia.  He voices understanding and wishes to proceed.     Ellison Hughs, MD 10/16/2019, 8:03 AM  [facility] Pager: [phone]

## 2019-10-16 NOTE — Anesthesia Postprocedure Evaluation (Signed)
Anesthesia Post Note  Patient: Manning Luna Marti  Procedure(s) Performed: RADIOACTIVE SEED IMPLANT/BRACHYTHERAPY IMPLANT (N/A )     Patient location during evaluation: PACU Anesthesia Type: General Level of consciousness: awake and alert Pain management: pain level controlled Vital Signs Assessment: post-procedure vital signs reviewed and stable Respiratory status: spontaneous breathing, nonlabored ventilation, respiratory function stable and patient connected to nasal cannula oxygen Cardiovascular status: blood pressure returned to baseline and stable Postop Assessment: no apparent nausea or vomiting Anesthetic complications: no   No complications documented.  Last Vitals:  Vitals:   10/16/19 1030 10/16/19 1045  BP: (!) 93/53 125/73  Pulse: 72 93  Resp: 18 19  Temp:    SpO2: 96% 97%    Last Pain:  Vitals:   10/16/19 1045  TempSrc:   PainSc: 0-No pain                 Jasmene Goswami L Markus Casten

## 2019-10-16 NOTE — Discharge Instructions (Signed)

## 2019-10-17 ENCOUNTER — Encounter (HOSPITAL_BASED_OUTPATIENT_CLINIC_OR_DEPARTMENT_OTHER): Payer: Self-pay | Admitting: Urology

## 2019-11-04 ENCOUNTER — Encounter: Payer: Self-pay | Admitting: Internal Medicine

## 2019-11-05 ENCOUNTER — Telehealth: Payer: Self-pay | Admitting: *Deleted

## 2019-11-05 NOTE — Telephone Encounter (Signed)
CALLED PATIENT TO REMIND OF POST SEED APPTS. FOR 11-06-19, SPOKE WITH PATIENT AND HE IS AWARE OF THESE APPTS.

## 2019-11-05 NOTE — Progress Notes (Signed)
Radiation Oncology         [phone] ________________________________  Name: Miguel Joseph MRN: [id number]  Date: 11/06/2019  DOB: 1940-10-17  Post-Seed Follow-Up Visit Note  CC: Binnie Rail, MD  Davis Gourd*  Diagnosis:   79 y.o. gentleman with Stage T1c adenocarcinoma of the prostate with Gleason score of 4+3, and PSA of 12.24.    ICD-10-CM   1. Malignant neoplasm of prostate ([place])  C61     Interval Since Last Radiation:  3 weeks 10/16/19:  Insertion of radioactive I-125 seeds into the prostate gland; 145 Gy, definitive therapy with placement of SpaceOAR VUE gel.  Narrative:  The patient returns today for routine follow-up.  He is complaining of increased urinary frequency and urinary hesitation symptoms. He filled out a questionnaire regarding urinary function today providing and overall IPSS score of 16 characterizing his symptoms as moderate with hesitancy, intermittency, frequency, urgency and nocturia 3x/night.  He specifically denies dysuria, gross hematuria, straining to void or incontinence.  His pre-implant score was 2. He denies any abdominal pain or bowel symptoms.  He does have a decreased appetite since the time of his procedure and has lost approximately 3 to 4 pounds.  He also reports a loss of taste immediately following the procedure which is gradually returning at this point.  He is taking alfuzosin as prescribed and feels that this does help with emptying his bladder.  He is also experiencing some dry mouth but is not sure what to attribute this to.  He has not taking any new medications aside from the alfuzosin.  Overall, he feels that he tolerated the procedure very well and is quite pleased with his progress to date.  ALLERGIES:  has No Known Allergies.  Meds: Current Outpatient Medications  Medication Sig Dispense Refill  . alfuzosin (UROXATRAL) 10 MG 24 hr tablet Take 10 mg by mouth daily with breakfast.    . amoxicillin (AMOXIL) 125 MG chewable  tablet Chew 125 mg by mouth 3 (three) times daily. sttarted 10-07-2019    . aspirin EC 81 MG tablet Take 81 mg by mouth daily.    . Multiple Vitamins-Minerals (ANTIOXIDANT FORMULA SG) capsule Take 1 capsule by mouth daily.    . Multiple Vitamins-Minerals (ANTIOXIDANT PO) Take by mouth daily.    Marland Kitchen oxybutynin (DITROPAN) 5 MG tablet Take 1 tablet (5 mg total) by mouth every 8 (eight) hours as needed for bladder spasms. 30 tablet 1  . phenazopyridine (PYRIDIUM) 200 MG tablet Take 1 tablet (200 mg total) by mouth 3 (three) times daily as needed (for pain with urination). 30 tablet 0  . pravastatin (PRAVACHOL) 40 MG tablet Take 1 tablet (40 mg total) by mouth daily. 90 tablet 3  . UNABLE TO FIND Vitamin b12 daily     No current facility-administered medications for this visit.    Physical Findings: In general this is a well appearing Caucasian male in no acute distress. He's alert and oriented x4 and appropriate throughout the examination. Cardiopulmonary assessment is negative for acute distress and he exhibits normal effort.   Lab Findings: Lab Results  Component Value Date   WBC 6.6 10/15/2019   HGB 15.1 10/15/2019   HCT 45.2 10/15/2019   MCV 94.4 10/15/2019   PLT 194 10/15/2019    Radiographic Findings:  Patient underwent CT imaging in our clinic for post implant dosimetry. The CT will be reviewed by Dr. Tammi Klippel to confirm there is an adequate distribution of radioactive seeds throughout the prostate  gland and ensure that there are no seeds in or near the rectum. We suspect the final radiation plan and dosimetry will show appropriate coverage of the prostate gland. He understands that we will call and inform him of any unexpected findings on further review of his imaging and dosimetry.  Impression/Plan: 79 y.o. gentleman with Stage T1c adenocarcinoma of the prostate with Gleason score of 4+3, and PSA of 12.24. The patient is recovering from the effects of radiation. His urinary symptoms  should gradually improve over the next 4-6 months. We talked about this today. He is encouraged by his improvement already and is otherwise pleased with his outcome. We also talked about long-term follow-up for prostate cancer following seed implant. He understands that ongoing PSA determinations and digital rectal exams will help perform surveillance to rule out disease recurrence. He has a follow up appointment scheduled with Dr. Lovena Neighbours next week. He understands what to expect with his PSA measures. Patient was also educated today about some of the long-term effects from radiation including a small risk for rectal bleeding and possibly erectile dysfunction. We talked about some of the general management approaches to these potential complications. However, I did encourage the patient to contact our office or return at any point if he has questions or concerns related to his previous radiation and prostate cancer.    Nicholos Johns, PA-C

## 2019-11-06 ENCOUNTER — Ambulatory Visit
Admission: RE | Admit: 2019-11-06 | Discharge: 2019-11-06 | Disposition: A | Discharge status: Home / Self Care | Payer: Medicare Other | Source: Ambulatory Visit | Attending: Radiation Oncology | Admitting: Radiation Oncology

## 2019-11-06 ENCOUNTER — Ambulatory Visit
Admission: RE | Admit: 2019-11-06 | Discharge: 2019-11-06 | Disposition: A | Discharge status: Home / Self Care | Payer: Medicare Other | Source: Ambulatory Visit | Attending: Urology | Admitting: Urology

## 2019-11-06 ENCOUNTER — Other Ambulatory Visit: Payer: Self-pay

## 2019-11-06 ENCOUNTER — Encounter: Payer: Self-pay | Admitting: Urology

## 2019-11-06 VITALS — BP 106/57 | HR 82 | Temp 97.7°F | Resp 18 | Ht 68.0 in | Wt 163.4 lb

## 2019-11-06 DIAGNOSIS — Z7982 Long term (current) use of aspirin: Secondary | ICD-10-CM | POA: Diagnosis not present

## 2019-11-06 DIAGNOSIS — Z510 Encounter for antineoplastic radiation therapy: Secondary | ICD-10-CM | POA: Insufficient documentation

## 2019-11-06 DIAGNOSIS — R05 Cough: Secondary | ICD-10-CM | POA: Diagnosis not present

## 2019-11-06 DIAGNOSIS — Z79899 Other long term (current) drug therapy: Secondary | ICD-10-CM | POA: Diagnosis not present

## 2019-11-06 DIAGNOSIS — Z923 Personal history of irradiation: Secondary | ICD-10-CM | POA: Insufficient documentation

## 2019-11-06 DIAGNOSIS — C61 Malignant neoplasm of prostate: Secondary | ICD-10-CM | POA: Diagnosis not present

## 2019-11-06 NOTE — Progress Notes (Signed)
Four pound weight loss noted since pre seed visit. Reports he lost his sense of taste but this is gradually improving. Reports that he continues to have a poor appetite. Reports severe dry mouth but uncertain of the cause. Vitals stable. Denies pain. Pre seed IPSS 2. Post seed IPSS 16. Denies dysuria or hematuria. Denies urinary leakage or incontinence. Reports UTI since biopsy seems to have finally resolved after a series of multiple antibiotics. Reports severe legs cramps at times. Reports a poor energy level. Reports his outter left leg is numb at times requiring him to walk with a cane to the restroom. Patient assumes the numbness he feels is related to the way in which he was restrained for the procedure. Reports his urine stream alternates between strong and weak and "just seems to be all over the ball park." Patient scheduled to follow up with Dr. Lovena Neighbours next week but hasn't seen any providers since his implant.

## 2019-11-06 NOTE — Progress Notes (Signed)
  Radiation Oncology         [phone] ________________________________  Name: Miguel Joseph MRN: [id number]  Date: 11/06/2019  DOB: [birth year]  COMPLEX SIMULATION NOTE  NARRATIVE:  The patient was brought to the [facility] today following prostate seed implantation approximately one month ago.  Identity was confirmed.  All relevant records and images related to the planned course of therapy were reviewed.  Then, the patient was set-up supine.  CT images were obtained.  The CT images were loaded into the planning software.  Then the prostate and rectum were contoured.  Treatment planning then occurred.  The implanted iodine 125 seeds were identified by the physics staff for projection of radiation distribution  I have requested : 3D Simulation  I have requested a DVH of the following structures: Prostate and rectum.    ________________________________  Sheral Apley Tammi Klippel, M.D.

## 2019-11-12 ENCOUNTER — Ambulatory Visit
Admission: RE | Admit: 2019-11-12 | Discharge: 2019-11-12 | Disposition: A | Discharge status: Home / Self Care | Payer: Medicare Other | Source: Ambulatory Visit | Attending: Radiation Oncology | Admitting: Radiation Oncology

## 2019-11-12 ENCOUNTER — Encounter: Payer: Self-pay | Admitting: Radiation Oncology

## 2019-11-12 DIAGNOSIS — Z510 Encounter for antineoplastic radiation therapy: Secondary | ICD-10-CM | POA: Insufficient documentation

## 2019-11-12 DIAGNOSIS — C61 Malignant neoplasm of prostate: Secondary | ICD-10-CM | POA: Diagnosis present

## 2019-11-19 NOTE — Progress Notes (Signed)
  Radiation Oncology         [phone] ________________________________  Name: Miguel Joseph MRN: [id number]  Date: 11/12/2019  DOB: 24-Jul-1940  3D Planning Note   Prostate Brachytherapy Post-Implant Dosimetry  Diagnosis: 79 y.o. gentleman with Stage T1c adenocarcinoma of the prostate with Gleason score of 4+3, and PSA of 12.24  Narrative: On a previous date, Miguel Joseph returned following prostate seed implantation for post implant planning. He underwent CT scan complex simulation to delineate the three-dimensional structures of the pelvis and demonstrate the radiation distribution.  Since that time, the seed localization, and complex isodose planning with dose volume histograms have now been completed.  Results:   Prostate Coverage - The dose of radiation delivered to the 90% or more of the prostate gland (D90) was 88.93% of the prescription dose. This is very close to our goal of greater than 90%. Rectal Sparing - The volume of rectal tissue receiving the prescription dose or higher was 0.0 cc. This falls under our thresholds tolerance of 1.0 cc.  Impression: The prostate seed implant appears to show adequate target coverage and appropriate rectal sparing.  Plan:  The patient will continue to follow with urology for ongoing PSA determinations. I would anticipate a high likelihood for local tumor control with minimal risk for rectal morbidity.  ________________________________  Sheral Apley Tammi Klippel, M.D.

## 2019-12-02 ENCOUNTER — Encounter: Payer: Self-pay | Admitting: Medical Oncology

## 2020-02-13 DIAGNOSIS — D2261 Melanocytic nevi of right upper limb, including shoulder: Secondary | ICD-10-CM | POA: Diagnosis not present

## 2020-02-13 DIAGNOSIS — L821 Other seborrheic keratosis: Secondary | ICD-10-CM | POA: Diagnosis not present

## 2020-02-13 DIAGNOSIS — L814 Other melanin hyperpigmentation: Secondary | ICD-10-CM | POA: Diagnosis not present

## 2020-02-13 DIAGNOSIS — D225 Melanocytic nevi of trunk: Secondary | ICD-10-CM | POA: Diagnosis not present

## 2020-02-13 DIAGNOSIS — D1801 Hemangioma of skin and subcutaneous tissue: Secondary | ICD-10-CM | POA: Diagnosis not present

## 2020-02-13 DIAGNOSIS — L570 Actinic keratosis: Secondary | ICD-10-CM | POA: Diagnosis not present

## 2020-03-12 ENCOUNTER — Encounter: Payer: Self-pay | Admitting: Internal Medicine

## 2020-03-13 ENCOUNTER — Other Ambulatory Visit: Payer: Self-pay

## 2020-03-13 ENCOUNTER — Ambulatory Visit (INDEPENDENT_AMBULATORY_CARE_PROVIDER_SITE_OTHER): Payer: Medicare Other | Admitting: Internal Medicine

## 2020-03-13 ENCOUNTER — Encounter: Payer: Self-pay | Admitting: Internal Medicine

## 2020-03-13 DIAGNOSIS — M4807 Spinal stenosis, lumbosacral region: Secondary | ICD-10-CM | POA: Diagnosis not present

## 2020-03-13 DIAGNOSIS — M4716 Other spondylosis with myelopathy, lumbar region: Secondary | ICD-10-CM | POA: Diagnosis not present

## 2020-03-13 NOTE — Patient Instructions (Signed)
  An MRI of your back was ordered.   A referral was ordered for Neurosurgery.     Someone from their office will call you to schedule an appointment.

## 2020-03-13 NOTE — Assessment & Plan Note (Signed)
Acute on chronic Symptoms getting worse Chronic lower back pain with numbness/decreased sensation in B/L LE below knees, ? Some change in sensation/sensitivity in perineum worsening spinal stenosis, ? Cauda equina MRI ordered Referral to neurosurgery

## 2020-03-13 NOTE — Progress Notes (Signed)
Subjective:    Patient ID: Miguel Joseph, male    DOB: [birth year], 80 y.o.   MRN: [id number]  HPI The patient is here for an acute visit.  He has chronic lower back pain.    If he sits for a while or stands for a while his b/l lower legs and feet numbness.  He has decreased sensation in his feet.  Walking can be difficult when he first gets up and it tends to get better the more he walks.    He denies pain in his legs.  He had this in the past, but it would come and go and now it is daily. He did see Dr Posey Pronto for this in 04/2018 and had an MRI. I reviewed her note.  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      His anus region is irritated - sensitive and a little pain.  He has to strain to have a BM, which is new.  When he does have a BM it comes out quick.  The stool sinks and used to float some. He occasionally has a pain feel like it radiates up toward his penis. There is no blood with bowel movements or in the stool.    He denies any falls.     S/p radioactive seed implantation for prostate ca 10/16/19     Medications and allergies reviewed with patient and updated if appropriate.  Patient Active Problem List   Diagnosis Date Noted  . Malignant neoplasm of prostate ([place]) 08/13/2019  . History of skin cancer 04/16/2019  . Elevated PSA 04/15/2019  . Numbness and tingling of both legs 02/12/2018  . CKD (chronic kidney disease) 02/08/2017  . 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 Lumbar radiculopathy 02/09/2016  . Prediabetes 06/29/2015  . Diverticulosis of colon without hemorrhage 12/23/2014  . NONSPECIFIC ABNORMAL ELECTROCARDIOGRAM 05/05/2009  . Hyperlipidemia 04/15/2008  . Midline low back pain without sciatica 04/15/2008    Current Outpatient Medications on File Prior to Visit  Medication Sig Dispense Refill  . alfuzosin (UROXATRAL) 10 MG 24 hr tablet Take 10 mg by mouth daily with breakfast.     . aspirin EC 81 MG tablet Take 81 mg by mouth daily. (Patient not taking: Reported on 11/06/2019)    . Multiple Vitamins-Minerals (ANTIOXIDANT FORMULA SG) capsule Take 1 capsule by mouth daily. (Patient not taking: Reported on 11/06/2019)    . pravastatin (PRAVACHOL) 40 MG tablet Take 1 tablet (40 mg total) by mouth daily. 90 tablet 3  . UNABLE TO FIND Vitamin b12 daily     No current facility-administered medications on file prior to visit.    Past Medical History:  Diagnosis Date  . Chronic kidney disease    managed by Dr. Pearson Grippe. Stage III chronic kidney disease. Stable.  . Diverticulosis   . E. coli UTI (urinary tract infection) june 2021 and 2014   Rx: Cipro  . History of anal fissures yrs ago  . Hyperlipidemia   . Prostate cancer ([place])   . UTI (urinary tract infection)    on amoxicillian    Past Surgical History:  Procedure Laterality Date  . APPENDECTOMY  1963  . COLONOSCOPY  2001 &;2007;2016   internal hemorrhoids; Dr Fuller Plan  . PROSTATE BIOPSY  06/28/2019  . RADIOACTIVE SEED IMPLANT N/A 10/16/2019   Procedure: RADIOACTIVE SEED IMPLANT/BRACHYTHERAPY IMPLANT;  Surgeon: Ceasar Mons, MD;  Location: [facility];  Service: Urology;  Laterality: N/A;  . TONSILLECTOMY AND ADENOIDECTOMY  yrs ago   adenoids removed not sure about tonsils  . WISDOM TOOTH EXTRACTION      Social History   Socioeconomic History  . Marital status: Married    Spouse name: Not on file  . Number of children: 3  . Years of education: Not on file  . Highest education level: Professional school degree (e.g., MD, DDS, DVM, JD)  Occupational History  . Occupation: retired Pensions consultant  Tobacco Use  . Smoking status: Never Smoker  . Smokeless tobacco: Never Used  Vaping Use  . Vaping Use: Never used  Substance and Sexual Activity  . Alcohol use: Not Currently    Alcohol/week: 4.0 standard drinks    Types: 4 Glasses of wine per week    Comment: occ wine  . Drug use:  Never  . Sexual activity: Yes  Other Topics Concern  . Not on file  Social History Narrative   He is retired Pensions consultant in 2010.   He lives with wife.  He has three grown children.   Social Determinants of Health   Financial Resource Strain: Low Risk   . Difficulty of Paying Living Expenses: Not hard at all  Food Insecurity: No Food Insecurity  . Worried About Programme researcher, broadcasting/film/video in the Last Year: Never true  . Ran Out of Food in the Last Year: Never true  Transportation Needs: No Transportation Needs  . Lack of Transportation (Medical): No  . Lack of Transportation (Non-Medical): No  Physical Activity: Sufficiently Active  . Days of Exercise per Week: 5 days  . Minutes of Exercise per Session: 30 min  Stress: No Stress Concern Present  . Feeling of Stress : Not at all  Social Connections: Socially Integrated  . Frequency of Communication with Friends and Family: More than three times a week  . Frequency of Social Gatherings with Friends and Family: More than three times a week  . Attends Religious Services: More than 4 times per year  . Active Member of Clubs or Organizations: Yes  . Attends Banker Meetings: More than 4 times per year  . Marital Status: Married    Family History  Problem Relation Age of Onset  . Breast cancer Mother   . Ovarian cancer Mother   . Testicular cancer Father   . Breast cancer Sister   . Pancreatic cancer Paternal Uncle         X2   . Heart block Paternal Aunt        pacer  . Colon polyps Son 58       tubular adenoma  . Diabetes Neg Hx   . Stroke Neg Hx   . Heart disease Neg Hx   . Hypertension Neg Hx   . Prostate cancer Neg Hx   . Colon cancer Neg Hx     Review of Systems  Constitutional: Negative for fever.  Musculoskeletal: Positive for back pain.  Neurological: Positive for numbness. Negative for weakness.       Objective:  There were no vitals filed for this visit. BP Readings from Last 3 Encounters:  11/06/19  (!) 106/57  11/06/19 (!) 106/57  10/16/19 121/73   Wt Readings from Last 3 Encounters:  11/06/19 163 lb 6.4 oz (74.1 kg)  11/06/19 163 lb 6.4 oz (74.1 kg)  10/16/19 166 lb (75.3 kg)   There is no height or weight on file to calculate BMI.  Physical Exam Constitutional:      General: He is not in acute distress.    Appearance: Normal appearance. He is not ill-appearing.  HENT:     Head: Normocephalic and atraumatic.  Musculoskeletal:     Right lower leg: No edema.     Left lower leg: No edema.  Skin:    General: Skin is warm and dry.  Neurological:     Mental Status: He is alert.     Sensory: Sensory deficit (decreased sensation b/l LE below knees) present.     Motor: No weakness.     Gait: Gait abnormal.     Deep Tendon Reflexes: Reflexes abnormal (decreased).     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 Multilevel spinal and foraminal stenosis as above. Severe subarticular and foraminal stenosis on the right at L4-5 and L5-S1. Moderate to severe subarticular stenosis on the left at L3-4.  Assessment & Plan:    I spent 30 minutes dedicated to the care of this patient on the date of this encounter including review of recent labs, imaging and procedures, speciality notes, obtaining history, communicating with the patient, ordering medications, tests, and documenting clinical information in the EHR   See Problem List for Assessment and Plan of chronic medical problems.    This visit occurred during the SARS-CoV-2 public health emergency.  Safety protocols were in place, including screening questions prior to the visit, additional usage of staff PPE, and extensive cleaning of exam room while observing appropriate contact time as indicated for disinfecting solutions.

## 2020-03-31 ENCOUNTER — Ambulatory Visit
Admission: RE | Admit: 2020-03-31 | Discharge: 2020-03-31 | Disposition: A | Discharge status: Home / Self Care | Payer: Medicare Other | Source: Ambulatory Visit | Attending: Internal Medicine | Admitting: Internal Medicine

## 2020-03-31 ENCOUNTER — Other Ambulatory Visit: Payer: Self-pay

## 2020-03-31 DIAGNOSIS — M4807 Spinal stenosis, lumbosacral region: Secondary | ICD-10-CM

## 2020-03-31 DIAGNOSIS — M48061 Spinal stenosis, lumbar region without neurogenic claudication: Secondary | ICD-10-CM | POA: Diagnosis not present

## 2020-03-31 DIAGNOSIS — M545 Low back pain, unspecified: Secondary | ICD-10-CM | POA: Diagnosis not present

## 2020-04-07 DIAGNOSIS — N1831 Chronic kidney disease, stage 3a: Secondary | ICD-10-CM | POA: Diagnosis not present

## 2020-04-11 ENCOUNTER — Other Ambulatory Visit: Payer: Self-pay | Admitting: Internal Medicine

## 2020-04-13 DIAGNOSIS — H02831 Dermatochalasis of right upper eyelid: Secondary | ICD-10-CM | POA: Diagnosis not present

## 2020-04-13 DIAGNOSIS — H2513 Age-related nuclear cataract, bilateral: Secondary | ICD-10-CM | POA: Diagnosis not present

## 2020-04-13 DIAGNOSIS — H53022 Refractive amblyopia, left eye: Secondary | ICD-10-CM | POA: Diagnosis not present

## 2020-04-13 DIAGNOSIS — H02834 Dermatochalasis of left upper eyelid: Secondary | ICD-10-CM | POA: Diagnosis not present

## 2020-04-13 DIAGNOSIS — H40033 Anatomical narrow angle, bilateral: Secondary | ICD-10-CM | POA: Diagnosis not present

## 2020-04-15 NOTE — Patient Instructions (Addendum)
Blood work was ordered.     Medications changes include :   none     Please followup in 1 year    Health Maintenance, Male Adopting a healthy lifestyle and getting preventive care are important in promoting health and wellness. Ask your health care provider about:  The right schedule for you to have regular tests and exams.  Things you can do on your own to prevent diseases and keep yourself healthy. What should I know about diet, weight, and exercise? Eat a healthy diet  Eat a diet that includes plenty of vegetables, fruits, low-fat dairy products, and lean protein.  Do not eat a lot of foods that are high in solid fats, added sugars, or sodium.   Maintain a healthy weight Body mass index (BMI) is a measurement that can be used to identify possible weight problems. It estimates body fat based on height and weight. Your health care provider can help determine your BMI and help you achieve or maintain a healthy weight. Get regular exercise Get regular exercise. This is one of the most important things you can do for your health. Most adults should:  Exercise for at least 150 minutes each week. The exercise should increase your heart rate and make you sweat (moderate-intensity exercise).  Do strengthening exercises at least twice a week. This is in addition to the moderate-intensity exercise.  Spend less time sitting. Even light physical activity can be beneficial. Watch cholesterol and blood lipids Have your blood tested for lipids and cholesterol at 81 years of age, then have this test every 5 years. You may need to have your cholesterol levels checked more often if:  Your lipid or cholesterol levels are high.  You are older than 81 years of age.  You are at high risk for heart disease. What should I know about cancer screening? Many types of cancers can be detected early and may often be prevented. Depending on your health history and family history, you may need to  have cancer screening at various ages. This may include screening for:  Colorectal cancer.  Prostate cancer.  Skin cancer.  Lung cancer. What should I know about heart disease, diabetes, and high blood pressure? Blood pressure and heart disease  High blood pressure causes heart disease and increases the risk of stroke. This is more likely to develop in people who have high blood pressure readings, are of African descent, or are overweight.  Talk with your health care provider about your target blood pressure readings.  Have your blood pressure checked: ? Every 3-5 years if you are 18-39 years of age. ? Every year if you are 40 years old or older.  If you are between the ages of 65 and 75 and are a current or former smoker, ask your health care provider if you should have a one-time screening for abdominal aortic aneurysm (AAA). Diabetes Have regular diabetes screenings. This checks your fasting blood sugar level. Have the screening done:  Once every three years after age 45 if you are at a normal weight and have a low risk for diabetes.  More often and at a younger age if you are overweight or have a high risk for diabetes. What should I know about preventing infection? Hepatitis B If you have a higher risk for hepatitis B, you should be screened for this virus. Talk with your health care provider to find out if you are at risk for hepatitis B infection. Hepatitis C Blood testing is recommended   for:  Everyone born from 1945 through 1965.  Anyone with known risk factors for hepatitis C. Sexually transmitted infections (STIs)  You should be screened each year for STIs, including gonorrhea and chlamydia, if: ? You are sexually active and are younger than 81 years of age. ? You are older than 81 years of age and your health care provider tells you that you are at risk for this type of infection. ? Your sexual activity has changed since you were last screened, and you are at  increased risk for chlamydia or gonorrhea. Ask your health care provider if you are at risk.  Ask your health care provider about whether you are at high risk for HIV. Your health care provider may recommend a prescription medicine to help prevent HIV infection. If you choose to take medicine to prevent HIV, you should first get tested for HIV. You should then be tested every 3 months for as long as you are taking the medicine. Follow these instructions at home: Lifestyle  Do not use any products that contain nicotine or tobacco, such as cigarettes, e-cigarettes, and chewing tobacco. If you need help quitting, ask your health care provider.  Do not use street drugs.  Do not share needles.  Ask your health care provider for help if you need support or information about quitting drugs. Alcohol use  Do not drink alcohol if your health care provider tells you not to drink.  If you drink alcohol: ? Limit how much you have to 0-2 drinks a day. ? Be aware of how much alcohol is in your drink. In the [place] one drink equals one 12 oz bottle of beer (355 mL), one 5 oz glass of wine (148 mL), or one 1 oz glass of hard liquor (44 mL). General instructions  Schedule regular health, dental, and eye exams.  Stay current with your vaccines.  Tell your health care provider if: ? You often feel depressed. ? You have ever been abused or do not feel safe at home. Summary  Adopting a healthy lifestyle and getting preventive care are important in promoting health and wellness.  Follow your health care provider's instructions about healthy diet, exercising, and getting tested or screened for diseases.  Follow your health care provider's instructions on monitoring your cholesterol and blood pressure. This information is not intended to replace advice given to you by your health care provider. Make sure you discuss any questions you have with your health care provider. Document Revised: 01/17/2018  Document Reviewed: 01/17/2018 Elsevier Patient Education  2021 [employer]  

## 2020-04-15 NOTE — Progress Notes (Signed)
Subjective:    Patient ID: Miguel Joseph, male    DOB: March 27, 1940, 80 y.o.   MRN: [id number]  HPI He is here for a physical exam.   He sees Dr Ellene Route next Friday.     He sees the kidney doctor tomorrow for his annual f/u.    He often has to strain to have a BM, then comes out in a force.  Off pain when stool comes out.  No bleeding.  He occ takes a laxative and that goes easier.    Medications and allergies reviewed with patient and updated if appropriate.  Patient Active Problem List   Diagnosis Date Noted  . Spondylosis with myelopathy, lumbar region 03/13/2020  . Malignant neoplasm of prostate ([place]) 08/13/2019  . History of skin cancer 04/16/2019  . Numbness and tingling of both legs 02/12/2018  . CKD (chronic kidney disease) 02/08/2017  . 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 Prediabetes 06/29/2015  . Diverticulosis of colon without hemorrhage 12/23/2014  . Hyperlipidemia 04/15/2008  . Midline low back pain without sciatica, lumbar spondylosis, stenosis 04/15/2008    Current Outpatient Medications on File Prior to Visit  Medication Sig Dispense Refill  . alfuzosin (UROXATRAL) 10 MG 24 hr tablet Take 10 mg by mouth daily with breakfast.    . Cyanocobalamin (VITAMIN B12 PO) Take by mouth.    . Ibuprofen 200 MG CAPS Take by mouth 2 (two) times daily.    . pravastatin (PRAVACHOL) 40 MG tablet TAKE 1 TABLET BY MOUTH EVERY DAY 90 tablet 3  . Probiotic Product (PROBIOTIC DAILY PO) Take by mouth.     No current facility-administered medications on file prior to visit.    Past Medical History:  Diagnosis Date  . Chronic kidney disease    managed by Dr. Pearson Grippe. Stage III chronic kidney disease. Stable.  . Diverticulosis   . E. coli UTI (urinary tract infection) june 2021 and 2014   Rx: Cipro  . History of anal fissures yrs ago  . Hyperlipidemia   . Prostate cancer ([place])   . UTI (urinary tract infection)    on amoxicillian    Past  Surgical History:  Procedure Laterality Date  . APPENDECTOMY  1963  . COLONOSCOPY  2001 &;2007;2016   internal hemorrhoids; Dr Fuller Plan  . PROSTATE BIOPSY  06/28/2019  . RADIOACTIVE SEED IMPLANT N/A 10/16/2019   Procedure: RADIOACTIVE SEED IMPLANT/BRACHYTHERAPY IMPLANT;  Surgeon: Ceasar Mons, MD;  Location: [facility];  Service: Urology;  Laterality: N/A;  . TONSILLECTOMY AND ADENOIDECTOMY  yrs ago   adenoids removed not sure about tonsils  . WISDOM TOOTH EXTRACTION      Social History   Socioeconomic History  . Marital status: Married    Spouse name: Not on file  . Number of children: 3  . Years of education: Not on file  . Highest education level: Professional school degree (e.g., MD, DDS, DVM, JD)  Occupational History  . Occupation: retired Forensic psychologist  Tobacco Use  . Smoking status: Never Smoker  . Smokeless tobacco: Never Used  Vaping Use  . Vaping Use: Never used  Substance and Sexual Activity  . Alcohol use: Not Currently    Alcohol/week: 4.0 standard drinks    Types: 4 Glasses of wine per week    Comment: occ wine  . Drug use: Never  . Sexual activity: Yes  Other Topics Concern  . Not on file  Social History Narrative  He is retired Forensic psychologist in 2010.   He lives with wife.  He has three grown children.   Social Determinants of Health   Financial Resource Strain: Low Risk   . Difficulty of Paying Living Expenses: Not hard at all  Food Insecurity: No Food Insecurity  . Worried About Charity fundraiser in the Last Year: Never true  . Ran Out of Food in the Last Year: Never true  Transportation Needs: No Transportation Needs  . Lack of Transportation (Medical): No  . Lack of Transportation (Non-Medical): No  Physical Activity: Sufficiently Active  . Days of Exercise per Week: 5 days  . Minutes of Exercise per Session: 30 min  Stress: No Stress Concern Present  . Feeling of Stress : Not at all  Social Connections: Socially Integrated   . Frequency of Communication with Friends and Family: More than three times a week  . Frequency of Social Gatherings with Friends and Family: More than three times a week  . Attends Religious Services: More than 4 times per year  . Active Member of Clubs or Organizations: Yes  . Attends Archivist Meetings: More than 4 times per year  . Marital Status: Married    Family History  Problem Relation Age of Onset  . Breast cancer Mother   . Ovarian cancer Mother   . Testicular cancer Father   . Breast cancer Sister   . Pancreatic cancer Paternal Uncle         X2   . Heart block Paternal Aunt        pacer  . Colon polyps Son 55       tubular adenoma  . Diabetes Neg Hx   . Stroke Neg Hx   . Heart disease Neg Hx   . Hypertension Neg Hx   . Prostate cancer Neg Hx   . Colon cancer Neg Hx     Review of Systems  Constitutional: Negative for chills and fever.  Eyes: Negative for visual disturbance.  Respiratory: Negative for cough, shortness of breath and wheezing.   Cardiovascular: Negative for chest pain, palpitations and leg swelling.  Gastrointestinal: Positive for constipation. Negative for abdominal pain, blood in stool, diarrhea and nausea.       No gerd  Genitourinary: Negative for dysuria and hematuria.  Musculoskeletal: Positive for back pain. Negative for arthralgias.  Skin: Negative for color change and rash.  Neurological: Negative for light-headedness and headaches.  Psychiatric/Behavioral: Negative for dysphoric mood. The patient is not nervous/anxious.        Objective:   Vitals:   04/16/20 0858  BP: 106/78  Pulse: 67  Temp: 98.2 F (36.8 C)  SpO2: 97%   Filed Weights   04/16/20 0858  Weight: 168 lb (76.2 kg)   Body mass index is 25.54 kg/m.  BP Readings from Last 3 Encounters:  04/16/20 106/78  03/13/20 108/72  11/06/19 (!) 106/57    Wt Readings from Last 3 Encounters:  04/16/20 168 lb (76.2 kg)  03/13/20 161 lb 12.8 oz (73.4 kg)   11/06/19 163 lb 6.4 oz (74.1 kg)     Physical Exam Constitutional: He appears well-developed and well-nourished. No distress.  HENT:  Head: Normocephalic and atraumatic.  Right Ear: External ear normal.  Left Ear: External ear normal.  Mouth/Throat: Oropharynx is clear and moist.  Normal ear canals and TM b/l  Eyes: Conjunctivae and EOM are normal.  Neck: Neck supple. No tracheal deviation present. No thyromegaly present.  No carotid  bruit  Cardiovascular: Normal rate, regular rhythm, normal heart sounds and intact distal pulses.   No murmur heard. Pulmonary/Chest: Effort normal and breath sounds normal. No respiratory distress. He has no wheezes. He has no rales.  Abdominal: Soft. He exhibits no distension. There is no tenderness.  Genitourinary: deferred  Musculoskeletal: He exhibits no edema.  Lymphadenopathy:   He has no cervical adenopathy.  Skin: Skin is warm and dry. He is not diaphoretic.  Psychiatric: He has a normal mood and affect. His behavior is normal.         Assessment & Plan:   Physical exam: Screening blood work  ordered Immunizations  Discussed shingrix, others up to date Colonoscopy   N/a due to age Eye exams   Up to date  Exercise   Active, yard work Weight  normal Substance abuse   None Sees derm annually   See Problem List for Assessment and Plan of chronic medical problems.   This visit occurred during the SARS-CoV-2 public health emergency.  Safety protocols were in place, including screening questions prior to the visit, additional usage of staff PPE, and extensive cleaning of exam room while observing appropriate contact time as indicated for disinfecting solutions.

## 2020-04-16 ENCOUNTER — Other Ambulatory Visit: Payer: Self-pay

## 2020-04-16 ENCOUNTER — Ambulatory Visit (INDEPENDENT_AMBULATORY_CARE_PROVIDER_SITE_OTHER): Payer: Medicare Other | Admitting: Internal Medicine

## 2020-04-16 ENCOUNTER — Encounter: Payer: Self-pay | Admitting: Internal Medicine

## 2020-04-16 VITALS — BP 106/78 | HR 67 | Temp 98.2°F | Ht 68.0 in | Wt 168.0 lb

## 2020-04-16 DIAGNOSIS — N1831 Chronic kidney disease, stage 3a: Secondary | ICD-10-CM | POA: Diagnosis not present

## 2020-04-16 DIAGNOSIS — R7303 Prediabetes: Secondary | ICD-10-CM

## 2020-04-16 DIAGNOSIS — E7849 Other hyperlipidemia: Secondary | ICD-10-CM | POA: Diagnosis not present

## 2020-04-16 DIAGNOSIS — M545 Low back pain, unspecified: Secondary | ICD-10-CM

## 2020-04-16 DIAGNOSIS — Z0000 Encounter for general adult medical examination without abnormal findings: Secondary | ICD-10-CM

## 2020-04-16 DIAGNOSIS — G8929 Other chronic pain: Secondary | ICD-10-CM | POA: Diagnosis not present

## 2020-04-16 LAB — LIPID PANEL
Cholesterol: 168 mg/dL (ref 0–200)
HDL: 46.4 mg/dL (ref >39.00)
LDL Cholesterol: 99 mg/dL (ref 0–99)
NonHDL: 121.35
Total CHOL/HDL Ratio: 4
Triglycerides: 114 mg/dL (ref 0.0–149.0)
VLDL: 22.8 mg/dL (ref 0.0–40.0)

## 2020-04-16 LAB — CBC WITH DIFFERENTIAL/PLATELET
Basophils Absolute: 0 10*3/uL (ref 0.0–0.1)
Basophils Relative: 0.5 % (ref 0.0–3.0)
Eosinophils Absolute: 0.2 10*3/uL (ref 0.0–0.7)
Eosinophils Relative: 3.1 % (ref 0.0–5.0)
HCT: 45.2 % (ref 39.0–52.0)
Hemoglobin: 15.4 g/dL (ref 13.0–17.0)
Lymphocytes Relative: 16.9 % (ref 12.0–46.0)
Lymphs Abs: 1 10*3/uL (ref 0.7–4.0)
MCHC: 34 g/dL (ref 30.0–36.0)
MCV: 92.8 fl (ref 78.0–100.0)
Monocytes Absolute: 0.6 10*3/uL (ref 0.1–1.0)
Monocytes Relative: 10.7 % (ref 3.0–12.0)
Neutro Abs: 3.9 10*3/uL (ref 1.4–7.7)
Neutrophils Relative %: 68.8 % (ref 43.0–77.0)
Platelets: 184 10*3/uL (ref 150.0–400.0)
RBC: 4.86 Mil/uL (ref 4.22–5.81)
RDW: 14.9 % (ref 11.5–15.5)
WBC: 5.6 10*3/uL (ref 4.0–10.5)

## 2020-04-16 LAB — TSH: TSH: 2.79 u[IU]/mL (ref 0.35–4.50)

## 2020-04-16 LAB — COMPREHENSIVE METABOLIC PANEL
ALT: 22 U/L (ref 0–53)
AST: 20 U/L (ref 0–37)
Albumin: 4.3 g/dL (ref 3.5–5.2)
Alkaline Phosphatase: 40 U/L (ref 39–117)
BUN: 24 mg/dL — ABNORMAL HIGH (ref 6–23)
CO2: 29 mEq/L (ref 19–32)
Calcium: 9.6 mg/dL (ref 8.4–10.5)
Chloride: 103 mEq/L (ref 96–112)
Creatinine, Ser: 1.4 mg/dL (ref 0.40–1.50)
GFR: 47.52 mL/min — ABNORMAL LOW (ref >60.00)
Glucose, Bld: 98 mg/dL (ref 70–99)
Potassium: 4.4 mEq/L (ref 3.5–5.1)
Sodium: 138 mEq/L (ref 135–145)
Total Bilirubin: 0.6 mg/dL (ref 0.2–1.2)
Total Protein: 7 g/dL (ref 6.0–8.3)

## 2020-04-16 LAB — HEMOGLOBIN A1C: Hgb A1c MFr Bld: 5.8 % (ref 4.6–6.5)

## 2020-04-16 NOTE — Assessment & Plan Note (Signed)
Chronic Check a1c Low sugar / carb diet Stressed regular exercise  

## 2020-04-16 NOTE — Assessment & Plan Note (Signed)
Chronic Sees nephrology tomorrow cmp today

## 2020-04-16 NOTE — Addendum Note (Signed)
Addended by: Jacobo Forest on: 04/16/2020 09:40 AM   Modules accepted: Orders

## 2020-04-16 NOTE — Assessment & Plan Note (Signed)
Chronic, intermittent pain No pain at night Pain with standing or reaching Feet go numb intermittently Sees Dr Ellene Route next week

## 2020-04-16 NOTE — Assessment & Plan Note (Signed)
Chronic Check lipid panel  Continue pravastatin 40 mg daily Regular exercise and healthy diet encouraged  

## 2020-04-17 DIAGNOSIS — N1831 Chronic kidney disease, stage 3a: Secondary | ICD-10-CM | POA: Diagnosis not present

## 2020-04-17 DIAGNOSIS — M47816 Spondylosis without myelopathy or radiculopathy, lumbar region: Secondary | ICD-10-CM | POA: Diagnosis not present

## 2020-04-17 DIAGNOSIS — Z923 Personal history of irradiation: Secondary | ICD-10-CM | POA: Diagnosis not present

## 2020-04-24 DIAGNOSIS — M48062 Spinal stenosis, lumbar region with neurogenic claudication: Secondary | ICD-10-CM | POA: Diagnosis not present

## 2020-05-07 DIAGNOSIS — M545 Low back pain, unspecified: Secondary | ICD-10-CM | POA: Diagnosis not present

## 2020-05-07 DIAGNOSIS — M48062 Spinal stenosis, lumbar region with neurogenic claudication: Secondary | ICD-10-CM | POA: Diagnosis not present

## 2020-06-20 ENCOUNTER — Encounter: Payer: Self-pay | Admitting: Internal Medicine

## 2020-06-20 ENCOUNTER — Telehealth: Payer: Self-pay | Admitting: Family Medicine

## 2020-06-20 MED ORDER — NIRMATRELVIR/RITONAVIR (PAXLOVID) TABLET (RENAL DOSING): ORAL_TABLET | ORAL | 0 refills | Status: DC

## 2020-06-20 NOTE — Telephone Encounter (Signed)
On call note: Pt with 24-36h mild malaise, runny nose, dry cough.  No fever or sob.  + home covid test yesterday Has had pfizer x 3. GFR 40s. Pt at high risk for complications. I eRx'd paxlovid at renal dosing after speaking with the patient. Signed:  Crissie Sickles, MD           06/20/2020

## 2020-06-22 NOTE — Telephone Encounter (Signed)
Team health Report/call: ---Caller states that he tested positive for covid last night, has runny nose , cough, denies fever, pain with Coughing.  On call doctor paged.

## 2020-06-25 ENCOUNTER — Encounter: Payer: Self-pay | Admitting: Internal Medicine

## 2020-10-30 ENCOUNTER — Encounter: Payer: Self-pay | Admitting: Internal Medicine

## 2020-11-18 ENCOUNTER — Ambulatory Visit (INDEPENDENT_AMBULATORY_CARE_PROVIDER_SITE_OTHER): Payer: Medicare Other

## 2020-11-18 DIAGNOSIS — Z0000 Encounter for general adult medical examination without abnormal findings: Secondary | ICD-10-CM | POA: Diagnosis not present

## 2020-11-18 NOTE — Progress Notes (Signed)
I connected with Miguel Joseph today by telephone and verified that I am speaking with the correct person using two identifiers. Location patient: home Location provider: work Persons participating in the virtual visit: patient, provider.   I discussed the limitations, risks, security and privacy concerns of performing an evaluation and management service by telephone and the availability of in person appointments. I also discussed with the patient that there may be a patient responsible charge related to this service. The patient expressed understanding and verbally consented to this telephonic visit.    Interactive audio and video telecommunications were attempted between this provider and patient, however failed, due to patient having technical difficulties OR patient did not have access to video capability.  We continued and completed visit with audio only.  Some vital signs may be absent or patient reported.   Time Spent with patient on telephone encounter: 40 minutes  Subjective:   Miguel Joseph is a 80 y.o. male who presents for Medicare Annual/Subsequent preventive examination.  Review of Systems     Cardiac Risk Factors include: advanced age (>52men, >3 women);dyslipidemia;male gender;family history of premature cardiovascular disease     Objective:    There were no vitals filed for this visit. There is no height or weight on file to calculate BMI.  Advanced Directives 11/18/2020 11/06/2019 10/16/2019 08/14/2019 08/13/2019  Does Patient Have a Medical Advance Directive? Yes Yes Yes Yes Yes  Type of Advance Directive - Clinical cytogeneticist of [employer] Power of [place];Living will Living will;Healthcare Power of Attorney  Does patient want to make changes to medical advance directive? No - Patient declined No - Patient declined - No - Patient declined No - Patient declined  Copy of [place] in Chart? - No - copy requested - -  No - copy requested    Current Medications (verified) Outpatient Encounter Medications as of 11/18/2020  Medication Sig   alfuzosin (UROXATRAL) 10 MG 24 hr tablet Take 10 mg by mouth daily with breakfast.   Cyanocobalamin (VITAMIN B12 PO) Take by mouth.   Ibuprofen 200 MG CAPS Take by mouth 2 (two) times daily.   pravastatin (PRAVACHOL) 40 MG tablet TAKE 1 TABLET BY MOUTH EVERY DAY   Probiotic Product (PROBIOTIC DAILY PO) Take by mouth.   nirmatrelvir/ritonavir EUA, renal dosing, (PAXLOVID) TABS Take nirmatrelvir (150 mg) one tablet twice daily for 5 days and ritonavir (100 mg) one tablet twice daily for 5 days. (Patient not taking: Reported on 11/18/2020)   No facility-administered encounter medications on file as of 11/18/2020.    Allergies (verified) Patient has no known allergies.   History: Past Medical History:  Diagnosis Date   Chronic kidney disease    managed by Dr. Pearson Grippe. Stage III chronic kidney disease. Stable.   Diverticulosis    E. coli UTI (urinary tract infection) june 2021 and 2014   Rx: Cipro   History of anal fissures yrs ago   Hyperlipidemia    Prostate cancer [facility])    UTI (urinary tract infection)    on amoxicillian   Past Surgical History:  Procedure Laterality Date   APPENDECTOMY  1963   COLONOSCOPY  2001 &;2007;2016   internal hemorrhoids; Dr Fuller Plan   PROSTATE BIOPSY  06/28/2019   RADIOACTIVE SEED IMPLANT N/A 10/16/2019   Procedure: RADIOACTIVE SEED IMPLANT/BRACHYTHERAPY IMPLANT;  Surgeon: Ceasar Mons, MD;  Location: [facility];  Service: Urology;  Laterality: N/A;   TONSILLECTOMY AND ADENOIDECTOMY  yrs ago  adenoids removed not sure about tonsils   WISDOM TOOTH EXTRACTION     Family History  Problem Relation Age of Onset   Breast cancer Mother    Ovarian cancer Mother    Testicular cancer Father    Breast cancer Sister    Pancreatic cancer Paternal Uncle         X2    Heart block Paternal Aunt         pacer   Colon polyps Son 32       tubular adenoma   Diabetes Neg Hx    Stroke Neg Hx    Heart disease Neg Hx    Hypertension Neg Hx    Prostate cancer Neg Hx    Colon cancer Neg Hx    Social History   Socioeconomic History   Marital status: Married    Spouse name: Not on file   Number of children: 3   Years of education: Not on file   Highest education level: Professional school degree (e.g., MD, DDS, DVM, JD)  Occupational History   Occupation: retired Forensic psychologist  Tobacco Use   Smoking status: Never   Smokeless tobacco: Never  Vaping Use   Vaping Use: Never used  Substance and Sexual Activity   Alcohol use: Not Currently    Alcohol/week: 4.0 standard drinks    Types: 4 Glasses of wine per week    Comment: occ wine   Drug use: Never   Sexual activity: Yes  Other Topics Concern   Not on file  Social History Narrative   He is retired Forensic psychologist in 2010.   He lives with wife.  He has three grown children.   Social Determinants of Health   Financial Resource Strain: Low Risk    Difficulty of Paying Living Expenses: Not hard at all  Food Insecurity: No Food Insecurity   Worried About Charity fundraiser in the Last Year: Never true   [place] in the Last Year: Never true  Transportation Needs: No Transportation Needs   Lack of Transportation (Medical): No   Lack of Transportation (Non-Medical): No  Physical Activity: Sufficiently Active   Days of Exercise per Week: 5 days   Minutes of Exercise per Session: 30 min  Stress: No Stress Concern Present   Feeling of Stress : Not at all  Social Connections: Socially Integrated   Frequency of Communication with Friends and Family: More than three times a week   Frequency of Social Gatherings with Friends and Family: More than three times a week   Attends Religious Services: More than 4 times per year   Active Member of [employer] or Organizations: Yes   Attends Music therapist: More than 4 times per year    Marital Status: Married    Tobacco Counseling Counseling given: Not Answered   Clinical Intake:  Pre-visit preparation completed: Yes  Pain : No/denies pain     Nutritional Risks: None Diabetes: No  How often do you need to have someone help you when you read instructions, pamphlets, or other written materials from your doctor or pharmacy?: 1 - Never What is the last grade level you completed in school?: 19 years; Retired Forensic psychologist  Diabetic? no  Interpreter Needed?: No  Information entered by :: Lisette Abu, LPN   Activities of Daily Living In your present state of health, do you have any difficulty performing the following activities: 11/18/2020  Hearing? Y  Vision? N  Difficulty concentrating or making decisions? N  Walking or climbing stairs? N  Dressing or bathing? N  Doing errands, shopping? N  Preparing Food and eating ? N  Using the Toilet? N  In the past six months, have you accidently leaked urine? N  Do you have problems with loss of bowel control? N  Managing your Medications? N  Managing your Finances? N  Housekeeping or managing your Housekeeping? N  Some recent data might be hidden    Patient Care Team: Binnie Rail, MD as PCP - General (Internal Medicine) Kristeen Miss, MD as Consulting Physician (Neurosurgery) Rolm Bookbinder, MD as Consulting Physician (Dermatology) Ceasar Mons, MD as Consulting Physician (Urology) Rexene Agent, MD as Attending Physician (Nephrology) Clent Jacks, MD as Consulting Physician (Ophthalmology)  Indicate any recent Medical Services you may have received from other than [facility] providers in the past year (date may be approximate).     Assessment:   This is a routine wellness examination for Nicolae.  Hearing/Vision screen Hearing Screening - Comments:: Patient has hearing difficulty.   Patient wears hearing aids. Vision Screening - Comments:: Patient wears corrective glasses/contacts.   Eye  exam done annually by:  Clent Jacks, MD. 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  Dietary issues and exercise activities discussed: Current Exercise Habits: Home exercise routine, Type of exercise: walking, Time (Minutes): 30, Frequency (Times/Week): 5, Weekly Exercise (Minutes/Week): 150, Intensity: Mild, Exercise limited by: orthopedic condition(s);neurologic condition(s)   Goals Addressed   None   Depression Screen PHQ 2/9 Scores 11/18/2020 08/14/2019 04/16/2019 02/12/2018 02/09/2017 02/09/2016 12/17/2012  PHQ - 2 Score 0 0 0 0 0 0 0    Fall Risk Fall Risk  11/18/2020 08/14/2019 04/16/2019 07/04/2018 04/16/2018  Falls in the past year? 0 0 0 1 0  Number falls in past yr: 0 0 0 0 0  Injury with Fall? 0 0 0 0 0  Risk for fall due to : No Fall Risks No Fall Risks - - -  Follow up Falls evaluation completed Falls evaluation completed - Falls evaluation completed Falls evaluation completed    FALL RISK PREVENTION PERTAINING TO THE HOME:  Any stairs in or around the home? Yes  If so, are there any without handrails? No  Home free of loose throw rugs in walkways, pet beds, electrical cords, etc? Yes  Adequate lighting in your home to reduce risk of falls? Yes   ASSISTIVE DEVICES UTILIZED TO PREVENT FALLS:  Life alert? No  Use of a cane, walker or w/c? No  Grab bars in the bathroom? Yes  Shower chair or bench in shower? Yes  Elevated toilet seat or a handicapped toilet? Yes   TIMED UP AND GO:  Was the test performed? No .  Length of time to ambulate 10 feet: n/a sec.   Gait steady and fast without use of assistive device  Cognitive Function: Normal cognitive status assessed by direct observation by this Nurse Health Advisor. No abnormalities found.          Immunizations Immunization History  Administered Date(s) Administered   Fluad Quad(high Dose 65+) 10/18/2018   Influenza Split 10/31/2011   Influenza Whole 12/26/2008   Influenza, High Dose Seasonal PF 12/17/2014,  11/28/2016, 11/13/2017, 11/04/2019   Influenza-Unspecified 11/07/2012, 10/28/2013, 11/30/2015   PFIZER(Purple Top)SARS-COV-2 Vaccination 02/20/2019, 03/12/2019, 11/04/2019, 05/15/2020, 10/29/2020   Pfizer Covid-19 Vaccine Bivalent Booster 25yrs & up 10/29/2020   Pneumococcal Conjugate-13 01/09/2015   Pneumococcal Polysaccharide-23 02/02/2016   Tetanus 10/21/2013   Zoster, Live 11/11/2013  TDAP status: Due, Education has been provided regarding the importance of this vaccine. Advised may receive this vaccine at local pharmacy or Health Dept. Aware to provide a copy of the vaccination record if obtained from local pharmacy or Health Dept. Verbalized acceptance and understanding.  Flu Vaccine status: Due, Education has been provided regarding the importance of this vaccine. Advised may receive this vaccine at local pharmacy or Health Dept. Aware to provide a copy of the vaccination record if obtained from local pharmacy or Health Dept. Verbalized acceptance and understanding.  Pneumococcal vaccine status: Up to date  Covid-19 vaccine status: Completed vaccines  Qualifies for Shingles Vaccine? Yes   Zostavax completed Yes   Shingrix Completed?: No.    Education has been provided regarding the importance of this vaccine. Patient has been advised to call insurance company to determine out of pocket expense if they have not yet received this vaccine. Advised may also receive vaccine at local pharmacy or Health Dept. Verbalized acceptance and understanding.  Screening Tests Health Maintenance  Topic Date Due   Zoster Vaccines- Shingrix (1 of 2) Never done   INFLUENZA VACCINE  09/07/2020   TETANUS/TDAP  10/22/2023   COVID-19 Vaccine  Completed   HPV VACCINES  Aged Out    Health Maintenance  Health Maintenance Due  Topic Date Due   Zoster Vaccines- Shingrix (1 of 2) Never done   INFLUENZA VACCINE  09/07/2020    Colorectal cancer screening: No longer required.   Lung Cancer  Screening: (Low Dose CT Chest recommended if Age 84-80 years, 30 pack-year currently smoking OR have quit w/in 15years.) does not qualify.   Lung Cancer Screening Referral: no  Additional Screening:  Hepatitis C Screening: does not qualify; Completed no  Vision Screening: Recommended annual ophthalmology exams for early detection of glaucoma and other disorders of the eye. Is the patient up to date with their annual eye exam?  Yes  Who is the provider or what is the name of the office in which the patient attends annual eye exams? Clent Jacks, MD. If pt is not established with a provider, would they like to be referred to a provider to establish care? No .   Dental Screening: Recommended annual dental exams for proper oral hygiene  Community Resource Referral / Chronic Care Management: CRR required this visit?  No   CCM required this visit?  No      Plan:     I have personally reviewed and noted the following in the patient's chart:   Medical and social history Use of alcohol, tobacco or illicit drugs  Current medications and supplements including opioid prescriptions. Patient is not currently taking opioid prescriptions. Functional ability and status Nutritional status Physical activity Advanced directives List of other physicians Hospitalizations, surgeries, and ER visits in previous 12 months Vitals Screenings to include cognitive, depression, and falls Referrals and appointments  In addition, I have reviewed and discussed with patient certain preventive protocols, quality metrics, and best practice recommendations. A written personalized care plan for preventive services as well as general preventive health recommendations were provided to patient.     Sheral Flow, LPN   [id number]   Nurse Notes:

## 2020-11-18 NOTE — Patient Instructions (Signed)
Mr. Miguel Joseph , Thank you for taking time to come for your Medicare Wellness Visit. I appreciate your ongoing commitment to your health goals. Please review the following plan we discussed and let me know if I can assist you in the future.   Screening recommendations/referrals: Colonoscopy: Not a candidate for screening due to age Recommended yearly ophthalmology/optometry visit for glaucoma screening and checkup Recommended yearly dental visit for hygiene and checkup  Vaccinations: Influenza vaccine: due Fall 2022 Pneumococcal vaccine: 01/09/2015, 02/02/2016 Tdap vaccine: never done Shingles vaccine: never done; will get vaccine in 2023 per patient   Covid-19: 02/20/2019, 03/12/2019, 11/04/2019, 05/15/2020, 10/29/2020  Advanced directives: Yes; documents on file.  Conditions/risks identified: Yes; Client understands the importance of follow-up with providers by attending scheduled visits and discussed goals to eat healthier, increase physical activity, exercise the brain, socialize more, get enough sleep and make time for laughter.  Next appointment: Please schedule your next Medicare Wellness Visit with your Nurse Health Advisor in 1 year by calling [phone].  Preventive Care 81 Years and Older, Male Preventive care refers to lifestyle choices and visits with your health care provider that can promote health and wellness. What does preventive care include? A yearly physical exam. This is also called an annual well check. Dental exams once or twice a year. Routine eye exams. Ask your health care provider how often you should have your eyes checked. Personal lifestyle choices, including: Daily care of your teeth and gums. Regular physical activity. Eating a healthy diet. Avoiding tobacco and drug use. Limiting alcohol use. Practicing safe sex. Taking low doses of aspirin every day. Taking vitamin and mineral supplements as recommended by your health care provider. What happens during an  annual well check? The services and screenings done by your health care provider during your annual well check will depend on your age, overall health, lifestyle risk factors, and family history of disease. Counseling  Your health care provider may ask you questions about your: Alcohol use. Tobacco use. Drug use. Emotional well-being. Home and relationship well-being. Sexual activity. Eating habits. History of falls. Memory and ability to understand (cognition). Work and work Statistician. Screening  You may have the following tests or measurements: Height, weight, and BMI. Blood pressure. Lipid and cholesterol levels. These may be checked every 5 years, or more frequently if you are over 32 years old. Skin check. Lung cancer screening. You may have this screening every year starting at age 81 if you have a 30-pack-year history of smoking and currently smoke or have quit within the past 15 years. Fecal occult blood test (FOBT) of the stool. You may have this test every year starting at age 81. Flexible sigmoidoscopy or colonoscopy. You may have a sigmoidoscopy every 5 years or a colonoscopy every 10 years starting at age 81. Prostate cancer screening. Recommendations will vary depending on your family history and other risks. Hepatitis C blood test. Hepatitis B blood test. Sexually transmitted disease (STD) testing. Diabetes screening. This is done by checking your blood sugar (glucose) after you have not eaten for a while (fasting). You may have this done every 1-3 years. Abdominal aortic aneurysm (AAA) screening. You may need this if you are a current or former smoker. Osteoporosis. You may be screened starting at age 81 if you are at high risk. Talk with your health care provider about your test results, treatment options, and if necessary, the need for more tests. Vaccines  Your health care provider may recommend certain vaccines, such as: Influenza  vaccine. This is recommended  every year. Tetanus, diphtheria, and acellular pertussis (Tdap, Td) vaccine. You may need a Td booster every 10 years. Zoster vaccine. You may need this after age 81. Pneumococcal 13-valent conjugate (PCV13) vaccine. One dose is recommended after age 81. Pneumococcal polysaccharide (PPSV23) vaccine. One dose is recommended after age 81. Talk to your health care provider about which screenings and vaccines you need and how often you need them. This information is not intended to replace advice given to you by your health care provider. Make sure you discuss any questions you have with your health care provider. Document Released: 02/20/2015 Document Revised: 10/14/2015 Document Reviewed: 11/25/2014 Elsevier Interactive Patient Education  2017 [place] Prevention in the Home Falls can cause injuries. They can happen to people of all ages. There are many things you can do to make your home safe and to help prevent falls. What can I do on the outside of my home? Regularly fix the edges of walkways and driveways and fix any cracks. Remove anything that might make you trip as you walk through a door, such as a raised step or threshold. Trim any bushes or trees on the path to your home. Use bright outdoor lighting. Clear any walking paths of anything that might make someone trip, such as rocks or tools. Regularly check to see if handrails are loose or broken. Make sure that both sides of any steps have handrails. Any raised decks and porches should have guardrails on the edges. Have any leaves, snow, or ice cleared regularly. Use sand or salt on walking paths during winter. Clean up any spills in your garage right away. This includes oil or grease spills. What can I do in the bathroom? Use night lights. Install grab bars by the toilet and in the tub and shower. Do not use towel bars as grab bars. Use non-skid mats or decals in the tub or shower. If you need to sit down in the shower,  use a plastic, non-slip stool. Keep the floor dry. Clean up any water that spills on the floor as soon as it happens. Remove soap buildup in the tub or shower regularly. Attach bath mats securely with double-sided non-slip rug tape. Do not have throw rugs and other things on the floor that can make you trip. What can I do in the bedroom? Use night lights. Make sure that you have a light by your bed that is easy to reach. Do not use any sheets or blankets that are too big for your bed. They should not hang down onto the floor. Have a firm chair that has side arms. You can use this for support while you get dressed. Do not have throw rugs and other things on the floor that can make you trip. What can I do in the kitchen? Clean up any spills right away. Avoid walking on wet floors. Keep items that you use a lot in easy-to-reach places. If you need to reach something above you, use a strong step stool that has a grab bar. Keep electrical cords out of the way. Do not use floor polish or wax that makes floors slippery. If you must use wax, use non-skid floor wax. Do not have throw rugs and other things on the floor that can make you trip. What can I do with my stairs? Do not leave any items on the stairs. Make sure that there are handrails on both sides of the stairs and use them. Fix  handrails that are broken or loose. Make sure that handrails are as long as the stairways. Check any carpeting to make sure that it is firmly attached to the stairs. Fix any carpet that is loose or worn. Avoid having throw rugs at the top or bottom of the stairs. If you do have throw rugs, attach them to the floor with carpet tape. Make sure that you have a light switch at the top of the stairs and the bottom of the stairs. If you do not have them, ask someone to add them for you. What else can I do to help prevent falls? Wear shoes that: Do not have high heels. Have rubber bottoms. Are comfortable and fit you  well. Are closed at the toe. Do not wear sandals. If you use a stepladder: Make sure that it is fully opened. Do not climb a closed stepladder. Make sure that both sides of the stepladder are locked into place. Ask someone to hold it for you, if possible. Clearly mark and make sure that you can see: Any grab bars or handrails. First and last steps. Where the edge of each step is. Use tools that help you move around (mobility aids) if they are needed. These include: Canes. Walkers. Scooters. Crutches. Turn on the lights when you go into a dark area. Replace any light bulbs as soon as they burn out. Set up your furniture so you have a clear path. Avoid moving your furniture around. If any of your floors are uneven, fix them. If there are any pets around you, be aware of where they are. Review your medicines with your doctor. Some medicines can make you feel dizzy. This can increase your chance of falling. Ask your doctor what other things that you can do to help prevent falls. This information is not intended to replace advice given to you by your health care provider. Make sure you discuss any questions you have with your health care provider. Document Released: 11/20/2008 Document Revised: 07/02/2015 Document Reviewed: 02/28/2014 Elsevier Interactive Patient Education  2017 [employer].

## 2020-11-30 ENCOUNTER — Encounter: Payer: Self-pay | Admitting: Internal Medicine

## 2021-02-17 DIAGNOSIS — H35372 Puckering of macula, left eye: Secondary | ICD-10-CM | POA: Diagnosis not present

## 2021-02-17 DIAGNOSIS — H53002 Unspecified amblyopia, left eye: Secondary | ICD-10-CM | POA: Diagnosis not present

## 2021-02-17 DIAGNOSIS — H52203 Unspecified astigmatism, bilateral: Secondary | ICD-10-CM | POA: Diagnosis not present

## 2021-02-17 DIAGNOSIS — H2513 Age-related nuclear cataract, bilateral: Secondary | ICD-10-CM | POA: Diagnosis not present

## 2021-03-18 DIAGNOSIS — H2512 Age-related nuclear cataract, left eye: Secondary | ICD-10-CM | POA: Diagnosis not present

## 2021-04-13 DIAGNOSIS — N1831 Chronic kidney disease, stage 3a: Secondary | ICD-10-CM | POA: Diagnosis not present

## 2021-04-15 ENCOUNTER — Other Ambulatory Visit: Payer: Self-pay | Admitting: Internal Medicine

## 2021-04-15 DIAGNOSIS — H2511 Age-related nuclear cataract, right eye: Secondary | ICD-10-CM | POA: Diagnosis not present

## 2021-04-19 ENCOUNTER — Encounter: Payer: Medicare Other | Admitting: Internal Medicine

## 2021-04-19 DIAGNOSIS — Z923 Personal history of irradiation: Secondary | ICD-10-CM | POA: Diagnosis not present

## 2021-04-19 DIAGNOSIS — M47816 Spondylosis without myelopathy or radiculopathy, lumbar region: Secondary | ICD-10-CM | POA: Diagnosis not present

## 2021-04-19 DIAGNOSIS — N1831 Chronic kidney disease, stage 3a: Secondary | ICD-10-CM | POA: Diagnosis not present

## 2021-04-21 ENCOUNTER — Encounter: Payer: Self-pay | Admitting: Internal Medicine

## 2021-04-21 NOTE — Progress Notes (Signed)
? ? ?Subjective:  ? ? Patient ID: Miguel Joseph, male    DOB: [birth year], 81 y.o.   MRN: [id number] ? ? ?This visit occurred during the SARS-CoV-2 public health emergency.  Safety protocols were in place, including screening questions prior to the visit, additional usage of staff PPE, and extensive cleaning of exam room while observing appropriate contact time as indicated for disinfecting solutions. ? ? ?HPI ?Kupono is here for  ?Chief Complaint  ?Patient presents with  ? Annual Exam  ? ? ?S/p b/l cataract removal ? 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 ? ? ?Medications and allergies reviewed with patient and updated if appropriate. ? ? ?Current Outpatient Medications on File Prior to Visit  ?Medication Sig Dispense Refill  ? alfuzosin (UROXATRAL) 10 MG 24 hr tablet Take 10 mg by mouth daily with breakfast.    ? Cyanocobalamin (VITAMIN B12 PO) Take by mouth.    ? Ibuprofen 200 MG CAPS Take by mouth 2 (two) times daily.    ? pravastatin (PRAVACHOL) 40 MG tablet TAKE 1 TABLET BY MOUTH EVERY DAY 90 tablet 3  ? Probiotic Product (PROBIOTIC DAILY PO) Take by mouth.    ? moxifloxacin (VIGAMOX) 0.5 % ophthalmic solution Place into the right eye.    ? prednisoLONE acetate (PRED FORTE) 1 % ophthalmic suspension Place 1 drop into the right eye 4 (four) times daily.    ? ?No current facility-administered medications on file prior to visit.  ? ? ?Review of Systems  ?Constitutional:  Negative for chills and fever.  ?Eyes:  Negative for visual disturbance.  ?Respiratory:  Negative for cough, shortness of breath and wheezing.   ?Cardiovascular:  Negative for chest pain, palpitations and leg swelling.  ?Gastrointestinal:  Negative for abdominal pain, blood in stool, constipation, diarrhea and nausea.  ?     No gerd  ?Genitourinary:  Negative for difficulty urinating, dysuria and hematuria.  ?Musculoskeletal:  Positive for arthralgias (mild) and back pain (spondylosiss, stenosis).  ?Skin:  Negative for rash.  ?Neurological:  Negative for  light-headedness and headaches.  ?Psychiatric/Behavioral:  Negative for dysphoric mood. The patient is not nervous/anxious.   ? ?   ?Objective:  ? ?Vitals:  ? 04/22/21 0751  ?BP: 108/78  ?Pulse: 76  ?Temp: 98 ?F (36.7 ?C)  ?SpO2: 96%  ? ?Filed Weights  ? 04/22/21 0751  ?Weight: 161 lb (73 kg)  ? ?Body mass index is 24.48 kg/m?. ? ?BP Readings from Last 3 Encounters:  ?04/22/21 108/78  ?04/16/20 106/78  ?03/13/20 108/72  ? ? ?Wt Readings from Last 3 Encounters:  ?04/22/21 161 lb (73 kg)  ?04/16/20 168 lb (76.2 kg)  ?03/13/20 161 lb 12.8 oz (73.4 kg)  ? ? ?  ?Physical Exam ?Constitutional: He appears well-developed and well-nourished. No distress.  ?HENT:  ?Head: Normocephalic and atraumatic.  ?Right Ear: External ear normal.  ?Left Ear: External ear normal.  ?Mouth/Throat: Oropharynx is clear and moist.  ?Normal ear canals and TM b/l  ?Eyes: Conjunctivae and EOM are normal.  ?Neck: Neck supple. No tracheal deviation present. No thyromegaly present.  ?No carotid bruit  ?Cardiovascular: Normal rate, regular rhythm, normal heart sounds and intact distal pulses.   ?No murmur heard. ?Pulmonary/Chest: Effort normal and breath sounds normal. No respiratory distress. He has no wheezes. He has no rales.  ?Abdominal: Soft. He exhibits no distension. There is no tenderness.  ?Genitourinary: deferred  ?Musculoskeletal: He exhibits no edema.  ?Lymphadenopathy:   He has no cervical adenopathy.  ?Skin: Skin is warm  and dry. He is not diaphoretic.  ?Psychiatric: He has a normal mood and affect. His behavior is normal.  ? ? ? ? ?   ?Assessment & Plan:  ? ?Physical exam: ?Screening blood work  ordered ?Exercise   very active - no formal exercise ?Weight   normal ?Substance abuse   none ? ? ?Reviewed recommended immunizations.   Advised shingrix at pharmacy ? ? ?Health Maintenance  ?Topic Date Due  ? COVID-19 Vaccine (6 - Booster for [place] series) 05/08/2021 (Originally 12/24/2020)  ? Zoster Vaccines- Shingrix (1 of 2) 07/23/2021  (Originally 12/09/1959)  ? TETANUS/TDAP  10/22/2023  ? Pneumonia Vaccine 61+ Years old  Completed  ? INFLUENZA VACCINE  Completed  ? HPV VACCINES  Aged Out  ? ? ? ?See Problem List for Assessment and Plan of chronic medical problems. ? ? ?

## 2021-04-21 NOTE — Patient Instructions (Addendum)
? ? ? ?Blood work was ordered.   ? ? ?Medications changes include :   none ? ? ? ?Return in about 1 year (around 04/23/2022) for CPE. ? ? ? ? ?Health Maintenance, Male ?Adopting a healthy lifestyle and getting preventive care are important in promoting health and wellness. Ask your health care provider about: ?The right schedule for you to have regular tests and exams. ?Things you can do on your own to prevent diseases and keep yourself healthy. ?What should I know about diet, weight, and exercise? ?Eat a healthy diet ? ?Eat a diet that includes plenty of vegetables, fruits, low-fat dairy products, and lean protein. ?Do not eat a lot of foods that are high in solid fats, added sugars, or sodium. ?Maintain a healthy weight ?Body mass index (BMI) is a measurement that can be used to identify possible weight problems. It estimates body fat based on height and weight. Your health care provider can help determine your BMI and help you achieve or maintain a healthy weight. ?Get regular exercise ?Get regular exercise. This is one of the most important things you can do for your health. Most adults should: ?Exercise for at least 150 minutes each week. The exercise should increase your heart rate and make you sweat (moderate-intensity exercise). ?Do strengthening exercises at least twice a week. This is in addition to the moderate-intensity exercise. ?Spend less time sitting. Even light physical activity can be beneficial. ?Watch cholesterol and blood lipids ?Have your blood tested for lipids and cholesterol at 82 years of age, then have this test every 5 years. ?You may need to have your cholesterol levels checked more often if: ?Your lipid or cholesterol levels are high. ?You are older than 82 years of age. ?You are at high risk for heart disease. ?What should I know about cancer screening? ?Many types of cancers can be detected early and may often be prevented. Depending on your health history and family history, you may  need to have cancer screening at various ages. This may include screening for: ?Colorectal cancer. ?Prostate cancer. ?Skin cancer. ?Lung cancer. ?What should I know about heart disease, diabetes, and high blood pressure? ?Blood pressure and heart disease ?High blood pressure causes heart disease and increases the risk of stroke. This is more likely to develop in people who have high blood pressure readings or are overweight. ?Talk with your health care provider about your target blood pressure readings. ?Have your blood pressure checked: ?Every 3-5 years if you are 79-22 years of age. ?Every year if you are 60 years old or older. ?If you are between the ages of 95 and 78 and are a current or former smoker, ask your health care provider if you should have a one-time screening for abdominal aortic aneurysm (AAA). ?Diabetes ?Have regular diabetes screenings. This checks your fasting blood sugar level. Have the screening done: ?Once every three years after age 82 if you are at a normal weight and have a low risk for diabetes. ?More often and at a younger age if you are overweight or have a high risk for diabetes. ?What should I know about preventing infection? ?Hepatitis B ?If you have a higher risk for hepatitis B, you should be screened for this virus. Talk with your health care provider to find out if you are at risk for hepatitis B infection. ?Hepatitis C ?Blood testing is recommended for: ?Everyone born from [birth year] through 1966. ?Anyone with known risk factors for hepatitis C. ?Sexually transmitted infections (STIs) ?You  should be screened each year for STIs, including gonorrhea and chlamydia, if: ?You are sexually active and are younger than 82 years of age. ?You are older than 82 years of age and your health care provider tells you that you are at risk for this type of infection. ?Your sexual activity has changed since you were last screened, and you are at increased risk for chlamydia or gonorrhea. Ask your health  care provider if you are at risk. ?Ask your health care provider about whether you are at high risk for HIV. Your health care provider may recommend a prescription medicine to help prevent HIV infection. If you choose to take medicine to prevent HIV, you should first get tested for HIV. You should then be tested every 3 months for as long as you are taking the medicine. ?Follow these instructions at home: ?Alcohol use ?Do not drink alcohol if your health care provider tells you not to drink. ?If you drink alcohol: ?Limit how much you have to 0-2 drinks a day. ?Know how much alcohol is in your drink. In the [place] one drink equals one 12 oz bottle of beer (355 mL), one 5 oz glass of wine (148 mL), or one 1? oz glass of hard liquor (44 mL). ?Lifestyle ?Do not use any products that contain nicotine or tobacco. These products include cigarettes, chewing tobacco, and vaping devices, such as e-cigarettes. If you need help quitting, ask your health care provider. ?Do not use street drugs. ?Do not share needles. ?Ask your health care provider for help if you need support or information about quitting drugs. ?General instructions ?Schedule regular health, dental, and eye exams. ?Stay current with your vaccines. ?Tell your health care provider if: ?You often feel depressed. ?You have ever been abused or do not feel safe at home. ?Summary ?Adopting a healthy lifestyle and getting preventive care are important in promoting health and wellness. ?Follow your health care provider's instructions about healthy diet, exercising, and getting tested or screened for diseases. ?Follow your health care provider's instructions on monitoring your cholesterol and blood pressure. ?This information is not intended to replace advice given to you by your health care provider. Make sure you discuss any questions you have with your health care provider. ?Document Revised: 06/15/2020 Document Reviewed: 06/15/2020 ?Elsevier Patient Education ? [employer]. ? ?

## 2021-04-22 ENCOUNTER — Ambulatory Visit (INDEPENDENT_AMBULATORY_CARE_PROVIDER_SITE_OTHER): Payer: Medicare Other | Admitting: Internal Medicine

## 2021-04-22 ENCOUNTER — Other Ambulatory Visit: Payer: Self-pay

## 2021-04-22 ENCOUNTER — Encounter: Payer: Self-pay | Admitting: Internal Medicine

## 2021-04-22 VITALS — BP 108/78 | HR 76 | Temp 98.0°F | Ht 68.0 in | Wt 161.0 lb

## 2021-04-22 DIAGNOSIS — G8929 Other chronic pain: Secondary | ICD-10-CM

## 2021-04-22 DIAGNOSIS — Z0000 Encounter for general adult medical examination without abnormal findings: Secondary | ICD-10-CM

## 2021-04-22 DIAGNOSIS — M545 Low back pain, unspecified: Secondary | ICD-10-CM

## 2021-04-22 DIAGNOSIS — C61 Malignant neoplasm of prostate: Secondary | ICD-10-CM

## 2021-04-22 DIAGNOSIS — R202 Paresthesia of skin: Secondary | ICD-10-CM

## 2021-04-22 DIAGNOSIS — E7849 Other hyperlipidemia: Secondary | ICD-10-CM | POA: Diagnosis not present

## 2021-04-22 DIAGNOSIS — N1831 Chronic kidney disease, stage 3a: Secondary | ICD-10-CM

## 2021-04-22 DIAGNOSIS — R7303 Prediabetes: Secondary | ICD-10-CM

## 2021-04-22 DIAGNOSIS — R200 Anesthesia of skin: Secondary | ICD-10-CM | POA: Diagnosis not present

## 2021-04-22 LAB — COMPREHENSIVE METABOLIC PANEL
ALT: 24 U/L (ref 0–53)
AST: 22 U/L (ref 0–37)
Albumin: 4.4 g/dL (ref 3.5–5.2)
Alkaline Phosphatase: 39 U/L (ref 39–117)
BUN: 26 mg/dL — ABNORMAL HIGH (ref 6–23)
CO2: 26 mEq/L (ref 19–32)
Calcium: 9.3 mg/dL (ref 8.4–10.5)
Chloride: 104 mEq/L (ref 96–112)
Creatinine, Ser: 1.54 mg/dL — ABNORMAL HIGH (ref 0.40–1.50)
GFR: 42.08 mL/min — ABNORMAL LOW (ref >60.00)
Glucose, Bld: 102 mg/dL — ABNORMAL HIGH (ref 70–99)
Potassium: 4.3 mEq/L (ref 3.5–5.1)
Sodium: 138 mEq/L (ref 135–145)
Total Bilirubin: 0.6 mg/dL (ref 0.2–1.2)
Total Protein: 6.8 g/dL (ref 6.0–8.3)

## 2021-04-22 LAB — TSH: TSH: 2.34 u[IU]/mL (ref 0.35–5.50)

## 2021-04-22 LAB — CBC WITH DIFFERENTIAL/PLATELET
Basophils Absolute: 0 10*3/uL (ref 0.0–0.1)
Basophils Relative: 0.4 % (ref 0.0–3.0)
Eosinophils Absolute: 0.2 10*3/uL (ref 0.0–0.7)
Eosinophils Relative: 2.7 % (ref 0.0–5.0)
HCT: 44.8 % (ref 39.0–52.0)
Hemoglobin: 14.9 g/dL (ref 13.0–17.0)
Lymphocytes Relative: 17.2 % (ref 12.0–46.0)
Lymphs Abs: 1 10*3/uL (ref 0.7–4.0)
MCHC: 33.3 g/dL (ref 30.0–36.0)
MCV: 94.6 fl (ref 78.0–100.0)
Monocytes Absolute: 0.6 10*3/uL (ref 0.1–1.0)
Monocytes Relative: 10.9 % (ref 3.0–12.0)
Neutro Abs: 4 10*3/uL (ref 1.4–7.7)
Neutrophils Relative %: 68.8 % (ref 43.0–77.0)
Platelets: 179 10*3/uL (ref 150.0–400.0)
RBC: 4.73 Mil/uL (ref 4.22–5.81)
RDW: 14.8 % (ref 11.5–15.5)
WBC: 5.8 10*3/uL (ref 4.0–10.5)

## 2021-04-22 LAB — LIPID PANEL
Cholesterol: 169 mg/dL (ref 0–200)
HDL: 51.6 mg/dL (ref >39.00)
LDL Cholesterol: 99 mg/dL (ref 0–99)
NonHDL: 117.13
Total CHOL/HDL Ratio: 3
Triglycerides: 91 mg/dL (ref 0.0–149.0)
VLDL: 18.2 mg/dL (ref 0.0–40.0)

## 2021-04-22 LAB — HEMOGLOBIN A1C: Hgb A1c MFr Bld: 6 % (ref 4.6–6.5)

## 2021-04-22 NOTE — Assessment & Plan Note (Signed)
Chronic Check a1c Low sugar / carb diet Stressed regular exercise  

## 2021-04-22 NOTE — Assessment & Plan Note (Signed)
Chronic ?Has been stable ?CMP ?

## 2021-04-22 NOTE — Assessment & Plan Note (Signed)
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

## 2021-04-22 NOTE — Addendum Note (Signed)
Addended by: Binnie Rail on: 04/22/2021 08:36 AM ? ? Modules accepted: Orders ? ?

## 2021-04-22 NOTE — Assessment & Plan Note (Signed)
Chronic Regular exercise and healthy diet encouraged Check lipid panel, CMP Continue pravastatin 40 mg daily 

## 2021-04-22 NOTE — Assessment & Plan Note (Signed)
S/p radioactive seeds ?psa stable ?Sees winter annually ?

## 2021-04-26 ENCOUNTER — Encounter: Payer: Self-pay | Admitting: Internal Medicine

## 2021-05-02 ENCOUNTER — Encounter: Payer: Self-pay | Admitting: Internal Medicine

## 2021-05-04 ENCOUNTER — Encounter: Payer: Self-pay | Admitting: Internal Medicine

## 2021-05-04 NOTE — Progress Notes (Signed)
? ? ?Subjective:  ? ? Patient ID: Miguel Joseph, male    DOB: 09/20/1940, 81 y.o.   MRN: [id number] ? ?This visit occurred during the SARS-CoV-2 public health emergency.  Safety protocols were in place, including screening questions prior to the visit, additional usage of staff PPE, and extensive cleaning of exam room while observing appropriate contact time as indicated for disinfecting solutions. ? ? ? ?HPI ?Miguel Joseph is here for  ?Chief Complaint  ?Patient presents with  ? Loss of Consciousness  ? ? ? ?He had left cataract surgery 2/9, right eye 3/9.  Still using eyedrops. ? ?Last week on 2 occasions when he went to the bathroom at night he had transient dizziness when he laid back down in bed.  3 mornings ago he got up at 730, went to the bathroom and picked up his scale and when he leaned down to pick it up he was very dizzy ( the room was spinning) and laid down on the floor.  He did not pass out.  He called his wife and after 5 minutes she helped him up.  He felt shaky for several hours.  Blood pressure 15 minutes after incident was 116/65 and heart rate 64.  He describes the dizziness as a spinning sensation.  2 nights ago he had very transient, 1 sec, of dizziness when he laid back down after going to the bathroom in the middle of the night.   ? ?Since then he has not had of dizziness but has been careful in his movements.   ? ? ? ?He does experience some SOB with going up stairs especially when carrying something.  ? ?No regular exercise.  He is active.   ? ?Medications and allergies reviewed with patient and updated if appropriate. ? ?Current Outpatient Medications on File Prior to Visit  ?Medication Sig Dispense Refill  ? alfuzosin (UROXATRAL) 10 MG 24 hr tablet Take 10 mg by mouth daily with breakfast.    ? Cyanocobalamin (VITAMIN B12 PO) Take by mouth.    ? Ibuprofen 200 MG CAPS Take by mouth 2 (two) times daily.    ? moxifloxacin (VIGAMOX) 0.5 % ophthalmic solution Place into the right eye.    ?  pravastatin (PRAVACHOL) 40 MG tablet TAKE 1 TABLET BY MOUTH EVERY DAY 90 tablet 3  ? prednisoLONE acetate (PRED FORTE) 1 % ophthalmic suspension Place 1 drop into the right eye 4 (four) times daily.    ? Probiotic Product (PROBIOTIC DAILY PO) Take by mouth.    ? ?No current facility-administered medications on file prior to visit.  ? ? ?Review of Systems  ?Constitutional:  Negative for fever.  ?HENT:  Negative for congestion, ear pain, sinus pressure, sinus pain and sore throat.   ?Respiratory:  Positive for shortness of breath (some DOE with stairs). Negative for cough and wheezing.   ?Cardiovascular:  Negative for chest pain and palpitations.  ?Gastrointestinal:  Negative for nausea.  ?Neurological:  Positive for dizziness. Negative for weakness, numbness and headaches.  ? ? ?   ?Objective:  ? ?Vitals:  ? 05/05/21 0752  ?BP: 112/80  ?Pulse: 61  ?Temp: 98.1 ?F (36.7 ?C)  ?SpO2: 96%  ? ?BP Readings from Last 3 Encounters:  ?05/05/21 112/80  ?04/22/21 108/78  ?04/16/20 106/78  ? ?Wt Readings from Last 3 Encounters:  ?05/05/21 168 lb (76.2 kg)  ?04/22/21 161 lb (73 kg)  ?04/16/20 168 lb (76.2 kg)  ? ?Body mass index is 25.54 kg/m?. ? ?  ?Physical  Exam ?Constitutional:   ?   General: He is not in acute distress. ?   Appearance: Normal appearance. He is not ill-appearing.  ?HENT:  ?   Head: Normocephalic and atraumatic.  ?Eyes:  ?   Conjunctiva/sclera: Conjunctivae normal.  ?Cardiovascular:  ?   Rate and Rhythm: Normal rate and regular rhythm.  ?   Heart sounds: Normal heart sounds. No murmur heard. ?Pulmonary:  ?   Effort: Pulmonary effort is normal. No respiratory distress.  ?   Breath sounds: Normal breath sounds. No wheezing or rales.  ?Musculoskeletal:  ?   Right lower leg: No edema.  ?   Left lower leg: No edema.  ?Skin: ?   General: Skin is warm and dry.  ?   Findings: No rash.  ?Neurological:  ?   Mental Status: He is alert. Mental status is at baseline.  ?   Sensory: No sensory deficit.  ?   Motor: No weakness.   ?Psychiatric:     ?   Mood and Affect: Mood normal.  ? ?   ? ?Lab Results  ?Component Value Date  ? WBC 5.8 04/22/2021  ? HGB 14.9 04/22/2021  ? HCT 44.8 04/22/2021  ? PLT 179.0 04/22/2021  ? GLUCOSE 102 (H) 04/22/2021  ? CHOL 169 04/22/2021  ? TRIG 91.0 04/22/2021  ? HDL 51.60 04/22/2021  ? LDLDIRECT 164.8 04/10/2008  ? [place] 99 04/22/2021  ? ALT 24 04/22/2021  ? AST 22 04/22/2021  ? NA 138 04/22/2021  ? K 4.3 04/22/2021  ? CL 104 04/22/2021  ? CREATININE 1.54 (H) 04/22/2021  ? BUN 26 (H) 04/22/2021  ? CO2 26 04/22/2021  ? TSH 2.34 04/22/2021  ? PSA 12.24 (H) 04/16/2019  ? INR 1.1 10/15/2019  ? HGBA1C 6.0 04/22/2021  ? ? ? ? ? ?Assessment & Plan:  ? ? ?See Problem List for Assessment and Plan of chronic medical problems.  ? ? ? ? ?

## 2021-05-05 ENCOUNTER — Ambulatory Visit (INDEPENDENT_AMBULATORY_CARE_PROVIDER_SITE_OTHER): Payer: Medicare Other | Admitting: Internal Medicine

## 2021-05-05 VITALS — BP 112/80 | HR 61 | Temp 98.1°F | Ht 68.0 in | Wt 168.0 lb

## 2021-05-05 DIAGNOSIS — R0609 Other forms of dyspnea: Secondary | ICD-10-CM

## 2021-05-05 DIAGNOSIS — R42 Dizziness and giddiness: Secondary | ICD-10-CM

## 2021-05-05 DIAGNOSIS — I700 Atherosclerosis of aorta: Secondary | ICD-10-CM | POA: Insufficient documentation

## 2021-05-05 NOTE — Assessment & Plan Note (Signed)
Acute ?He does experience some shortness of breath with moderate exertion-such as walking up stairs when he is carrying something-this is new ?He is not exercising regularly, but is active ?Likely deconditioning ?He will try to start up with regular exercise ?Will refer to cardiology for further evaluation to confirm this is not cardiac in nature ?

## 2021-05-05 NOTE — Assessment & Plan Note (Signed)
Acute ?The episodes of dizziness that he experienced these past few days do sound like vertigo ?They occurred with changes in position-lying back in bed or bending over to pick something up ?No concerning symptoms such as palpitations, shortness of breath, chest pain or associated with these events ?Because of vertigo and known ?I doubt that this is cardiac in nature, but he will be seeing them for further evaluation of his dyspnea on exertion ?He will monitor for dizziness let me know if it recurs ?

## 2021-05-05 NOTE — Patient Instructions (Addendum)
? ? ? ?Medications changes include :   none ? ? ? ? ?A referral was ordered for cardiology.     Someone from that office will call you to schedule an appointment.  ? ? ? ?Vertigo ?Vertigo is the feeling that you or your surroundings are moving when they are not. This feeling can come and go at any time. Vertigo often goes away on its own. Vertigo can be dangerous if it occurs while you are doing something that could endanger yourself or others, such as driving or operating machinery. ?Your health care provider will do tests to try to determine the cause of your vertigo. Tests will also help your health care provider decide how best to treat your condition. ?Follow these instructions at home: ?Eating and drinking ?  ?Dehydration can make vertigo worse. Drink enough fluid to keep your urine pale yellow. ?Do not drink alcohol. ?Activity ?Return to your normal activities as told by your health care provider. Ask your health care provider what activities are safe for you. ?In the morning, first sit up on the side of the bed. When you feel okay, stand slowly while you hold onto something until you know that your balance is fine. ?Move slowly. Avoid sudden body or head movements or certain positions, as told by your health care provider. ?If you have trouble walking or keeping your balance, try using a cane for stability. If you feel dizzy or unstable, sit down right away. ?Avoid doing any tasks that would cause danger to you or others if vertigo occurs. ?Avoid bending down if you feel dizzy. Place items in your home so that they are easy for you to reach without bending or leaning over. ?Do not drive or use machinery if you feel dizzy. ?General instructions ?Take over-the-counter and prescription medicines only as told by your health care provider. ?Keep all follow-up visits. This is important. ?Contact a health care provider if: ?Your medicines do not relieve your vertigo or they make it worse. ?Your condition gets  worse or you develop new symptoms. ?You have a fever. ?You develop nausea or vomiting, or if nausea gets worse. ?Your family or friends notice any behavioral changes. ?You have numbness or a prickling and tingling sensation in part of your body. ?Get help right away if you: ?Are always dizzy or you faint. ?Develop severe headaches. ?Develop a stiff neck. ?Develop sensitivity to light. ?Have difficulty moving or speaking. ?Have weakness in your hands, arms, or legs. ?Have changes in your hearing or vision. ?These symptoms may represent a serious problem that is an emergency. Do not wait to see if the symptoms will go away. Get medical help right away. Call your local emergency services (911 in the [place] Do not drive yourself to the hospital. ?Summary ?Vertigo is the feeling that you or your surroundings are moving when they are not. ?Your health care provider will do tests to try to determine the cause of your vertigo. ?Follow instructions for home care. You may be told to avoid certain tasks, positions, or movements. ?Contact a health care provider if your medicines do not relieve your symptoms, or if you have a fever, nausea, vomiting, or changes in behavior. ?Get help right away if you have severe headaches or difficulty speaking, or you develop hearing or vision problems. ?This information is not intended to replace advice given to you by your health care provider. Make sure you discuss any questions you have with your health care provider. ?Document Revised: 12/25/2019 Document  Reviewed: 12/25/2019 ?Elsevier Patient Education ? [place]. ? ?

## 2021-05-06 ENCOUNTER — Encounter: Payer: Self-pay | Admitting: Internal Medicine

## 2021-05-06 ENCOUNTER — Ambulatory Visit: Payer: Medicare Other | Admitting: Internal Medicine

## 2021-05-06 VITALS — BP 104/62 | HR 71 | Ht 68.0 in | Wt 169.0 lb

## 2021-05-06 DIAGNOSIS — R42 Dizziness and giddiness: Secondary | ICD-10-CM | POA: Diagnosis not present

## 2021-05-06 DIAGNOSIS — R0609 Other forms of dyspnea: Secondary | ICD-10-CM

## 2021-05-06 DIAGNOSIS — H539 Unspecified visual disturbance: Secondary | ICD-10-CM

## 2021-05-06 DIAGNOSIS — R0989 Other specified symptoms and signs involving the circulatory and respiratory systems: Secondary | ICD-10-CM | POA: Diagnosis not present

## 2021-05-06 NOTE — Addendum Note (Signed)
Addended by: Pixie Casino on: 05/06/2021 05:01 PM ? ? Modules accepted: Level of Service ? ?

## 2021-05-06 NOTE — Progress Notes (Signed)
? ?OFFICE CONSULT NOTE ? ?Chief Complaint:  ?Dizziness, shortness of breath ? ?Primary Care Physician: ?Binnie Rail, MD ? ?HPI:  ?Miguel Joseph is a 81 y.o. male who is being seen today for the evaluation of dizziness, shortness of breath at the request of Burns, Claudina Lick, MD. 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  He reports some longstanding shortness of breath but also recently has had acute on chronic shortness of breath, per tickly walking upstairs.  He has reported several episodes of dizziness.  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  He also had a similar experience when he went to the [place] to review a show.  He has had a couple episodes of dizziness as well which seem to be positional.  His PCP felt that this might be a positional vertigo but further evaluation with cardiology was recommended.  He denies any chest pain.  He was noted to have some coronary artery calcification by CT scan that was performed in May 2021.  He denies any significant early onset heart disease in his family and reported that his father was a professor at [employer]. ? ?PMHx:  ?Past Medical History:  ?Diagnosis Date  ? Chronic kidney disease   ? managed by Dr. Pearson Grippe. Stage III chronic kidney disease. Stable.  ? Diverticulosis   ? E. coli UTI (urinary tract infection) june 2021 and 2014  ? Rx: Cipro  ? History of anal fissures yrs ago  ? Hyperlipidemia   ? Prostate cancer ([facility])   ? UTI (urinary tract infection)   ? on amoxicillian  ? ? ?Past Surgical History:  ?Procedure Laterality Date  ? APPENDECTOMY  1963  ? CATARACT EXTRACTION, BILATERAL    ? COLONOSCOPY  2001 &;2007;2016  ? internal hemorrhoids; Dr Fuller Plan  ? PROSTATE BIOPSY  06/28/2019  ? RADIOACTIVE  SEED IMPLANT N/A 10/16/2019  ? Procedure: RADIOACTIVE SEED IMPLANT/BRACHYTHERAPY IMPLANT;  Surgeon: Ceasar Mons, MD;  Location: [facility];  Service: Urology;  Laterality: N/A;  ? TONSILLECTOMY AND ADENOIDECTOMY  yrs ago  ? adenoids removed not sure about tonsils  ? WISDOM TOOTH EXTRACTION    ? ? ?FAMHx:  ?Family History  ?Problem Relation Age of Onset  ? Breast cancer Mother   ? Ovarian cancer Mother   ? Testicular cancer Father   ? Breast cancer Sister   ? Pancreatic cancer Paternal Uncle   ?      X2   ? Heart block Paternal Aunt   ?     pacer  ? Colon polyps Son 62  ?     tubular adenoma  ? Diabetes Neg Hx   ? Stroke Neg Hx   ? Heart disease Neg Hx   ? Hypertension Neg Hx   ? Prostate cancer Neg Hx   ? Colon cancer Neg Hx   ? ? ?SOCHx:  ? reports that he has never smoked. He has never used smokeless tobacco. He reports that he does not currently use alcohol after a past usage of about 4.0 standard drinks per week. He reports that he does not use drugs. ? ?ALLERGIES:  ?No Known Allergies ? ?ROS: ?Pertinent items noted in HPI and remainder of comprehensive ROS  otherwise negative. ? ?HOME MEDS: ?Current Outpatient Medications on File Prior to Visit  ?Medication Sig Dispense Refill  ? alfuzosin (UROXATRAL) 10 MG 24 hr tablet Take 10 mg by mouth daily with breakfast.    ? Cyanocobalamin (VITAMIN B12 PO) Take by mouth.    ? Ibuprofen 200 MG CAPS Take by mouth 2 (two) times daily.    ? moxifloxacin (VIGAMOX) 0.5 % ophthalmic solution Place into the right eye.    ? pravastatin (PRAVACHOL) 40 MG tablet TAKE 1 TABLET BY MOUTH EVERY DAY 90 tablet 3  ? prednisoLONE acetate (PRED FORTE) 1 % ophthalmic suspension Place 1 drop into the right eye 4 (four) times daily.    ? Probiotic Product (PROBIOTIC DAILY PO) Take by mouth.    ? ?No current facility-administered medications on file prior to visit.  ? ? ?LABS/IMAGING: ?No results found for this or any previous visit (from the past 48 hour(s)). ?No  results found. ? ?LIPID PANEL: ?   ?Component Value Date/Time  ? CHOL 169 04/22/2021 0837  ? CHOL 237 (H) 12/19/2013 1042  ? TRIG 91.0 04/22/2021 0837  ? TRIG 179 (H) 12/19/2013 1042  ? HDL 51.60 04/22/2021 0837  ? HDL 40 12/19/2013 1042  ? CHOLHDL 3 04/22/2021 0837  ? VLDL 18.2 04/22/2021 0837  ? [place] 99 04/22/2021 0837  ? LDLCALC 161 (H) 12/19/2013 1042  ? LDLDIRECT 164.8 04/10/2008 1015  ? ? ?WEIGHTS: ?Wt Readings from Last 3 Encounters:  ?05/06/21 169 lb (76.7 kg)  ?05/05/21 168 lb (76.2 kg)  ?04/22/21 161 lb (73 kg)  ? ? ?VITALS: ?BP 104/62 (BP Location: Left Arm, Patient Position: Sitting, Cuff Size: Normal)   Pulse 71   Ht '5\' 8"'$  (1.727 m)   Wt 169 lb (76.7 kg)   BMI 25.70 kg/m?  ? ?EXAM: ?General appearance: alert and no distress ?Neck: no JVD, thyroid not enlarged, symmetric, no tenderness/mass/nodules, and loud left carotid bruit ?Lungs: clear to auscultation bilaterally ?Heart: regular rate and rhythm, S1, S2 normal, no murmur, click, rub or gallop ?Abdomen: soft, non-tender; bowel sounds normal; no masses,  no organomegaly ?Extremities: extremities normal, atraumatic, no cyanosis or edema ?Pulses: 2+ and symmetric ?Skin: Skin color, texture, turgor normal. No rashes or lesions ?Neurologic: Grossly normal ?Psych: Pleasant ? ?EKG: ?Normal sinus rhythm at 71, nonspecific IVCD- personally reviewed ? ?ASSESSMENT: ?Dizziness, shortness of breath ?Left carotid bruit ?Abnormal EKG (reportedly stable from a prior EKG done years ago by Dr. Linna Darner) ?Coronary artery calcification ? ?PLAN: ?1.   Mr. More has had some dizziness and progressive shortness of breath.  On exam he was found to have a loud left carotid bruit and likely has some degree of stenosis.  This could explain his possible intermittent dizziness episodes which could be TIAs or a steal phenomenon.  I am going to go ahead and get carotid Dopplers and we will get an echocardiogram as well because of his shortness of breath.  Plan follow-up with  me to evaluate this afterwards.  He does have coronary artery calcification and may ultimately need stress testing or a dedicated coronary CT angiogram to evaluate this, especially if he will need surgery on the carotid. ? ?Thanks again for the kind referral ? ?Pixie Casino, MD, [facility], FACP  ?Day  ?Medical Director of the Advanced Lipid Disorders &  ?Cardiovascular [facility] ?Diplomate of the [employer] of Clinical Lipidology ?Attending Cardiologist  ?Direct Dial: [phone]  Fax: [phone]  ?Website:  [email or web address] ? ?  Nadean Corwin Carrington Mullenax ?05/06/2021, 4:56 PM ?

## 2021-05-06 NOTE — Patient Instructions (Signed)
Medication Instructions:  ?Your physician recommends that you continue on your current medications as directed. Please refer to the Current Medication list given to you today. ? ?*If you need a refill on your cardiac medications before your next appointment, please call your pharmacy* ? ? ?Testing/Procedures:  ?Your physician has requested that you have an echocardiogram. Echocardiography is a painless test that uses sound waves to create images of your heart. It provides your doctor with information about the size and shape of your heart and how well your heart?s chambers and valves are working. This procedure takes approximately one hour. There are no restrictions for this procedure. This is done at [facility] ? ?Your physician has requested that you have a carotid duplex. This test is an ultrasound of the carotid arteries in your neck. It looks at blood flow through these arteries that supply the brain with blood. Allow one hour for this exam. There are no restrictions or special instructions. This is done at Dr. Lysbeth Penner office ? ? ? ?Follow-Up: ?At [facility], you and your health needs are our priority.  As part of our continuing mission to provide you with exceptional heart care, we have created designated Provider Care Teams.  These Care Teams include your primary Cardiologist (physician) and Advanced Practice Providers (APPs -  Physician Assistants and Nurse Practitioners) who all work together to provide you with the care you need, when you need it. ? ?We recommend signing up for the patient portal called "MyChart".  Sign up information is provided on this After Visit Summary.  MyChart is used to connect with patients for Virtual Visits (Telemedicine).  Patients are able to view lab/test results, encounter notes, upcoming appointments, etc.  Non-urgent messages can be sent to your provider as well.   ?To learn more about what you can do with MyChart, go to  [email or web address].   ? ?Your next appointment:   ? ?1-2 months with Dr. Debara Pickett -- after testing ? ?

## 2021-05-18 DIAGNOSIS — Z961 Presence of intraocular lens: Secondary | ICD-10-CM | POA: Diagnosis not present

## 2021-05-20 ENCOUNTER — Ambulatory Visit (HOSPITAL_BASED_OUTPATIENT_CLINIC_OR_DEPARTMENT_OTHER): Payer: Medicare Other

## 2021-05-20 DIAGNOSIS — R42 Dizziness and giddiness: Secondary | ICD-10-CM

## 2021-05-20 DIAGNOSIS — E785 Hyperlipidemia, unspecified: Secondary | ICD-10-CM | POA: Diagnosis not present

## 2021-05-20 DIAGNOSIS — I340 Nonrheumatic mitral (valve) insufficiency: Secondary | ICD-10-CM | POA: Diagnosis not present

## 2021-05-20 DIAGNOSIS — I080 Rheumatic disorders of both mitral and aortic valves: Secondary | ICD-10-CM | POA: Diagnosis not present

## 2021-05-20 DIAGNOSIS — R7303 Prediabetes: Secondary | ICD-10-CM | POA: Diagnosis not present

## 2021-05-20 DIAGNOSIS — H539 Unspecified visual disturbance: Secondary | ICD-10-CM

## 2021-05-20 DIAGNOSIS — I77810 Thoracic aortic ectasia: Secondary | ICD-10-CM | POA: Diagnosis not present

## 2021-05-20 DIAGNOSIS — I5030 Unspecified diastolic (congestive) heart failure: Secondary | ICD-10-CM

## 2021-05-20 DIAGNOSIS — I517 Cardiomegaly: Secondary | ICD-10-CM

## 2021-05-20 DIAGNOSIS — R0989 Other specified symptoms and signs involving the circulatory and respiratory systems: Secondary | ICD-10-CM

## 2021-05-20 DIAGNOSIS — N189 Chronic kidney disease, unspecified: Secondary | ICD-10-CM | POA: Diagnosis not present

## 2021-05-20 DIAGNOSIS — Z8546 Personal history of malignant neoplasm of prostate: Secondary | ICD-10-CM | POA: Diagnosis not present

## 2021-05-20 LAB — ECHOCARDIOGRAM COMPLETE
Area-P 1/2: 3.42 cm2
S' Lateral: 2.8 cm

## 2021-05-21 ENCOUNTER — Ambulatory Visit (HOSPITAL_COMMUNITY)
Admission: RE | Admit: 2021-05-21 | Discharge: 2021-05-21 | Disposition: A | Discharge status: Home / Self Care | Payer: Medicare Other | Source: Ambulatory Visit | Attending: Cardiovascular Disease | Admitting: Cardiovascular Disease

## 2021-05-21 ENCOUNTER — Encounter: Payer: Self-pay | Admitting: Internal Medicine

## 2021-05-21 DIAGNOSIS — E785 Hyperlipidemia, unspecified: Secondary | ICD-10-CM | POA: Diagnosis not present

## 2021-05-21 DIAGNOSIS — R7303 Prediabetes: Secondary | ICD-10-CM | POA: Insufficient documentation

## 2021-05-21 DIAGNOSIS — H539 Unspecified visual disturbance: Secondary | ICD-10-CM | POA: Diagnosis not present

## 2021-05-21 DIAGNOSIS — R0989 Other specified symptoms and signs involving the circulatory and respiratory systems: Secondary | ICD-10-CM

## 2021-05-21 DIAGNOSIS — N189 Chronic kidney disease, unspecified: Secondary | ICD-10-CM | POA: Diagnosis not present

## 2021-05-21 DIAGNOSIS — I340 Nonrheumatic mitral (valve) insufficiency: Secondary | ICD-10-CM | POA: Insufficient documentation

## 2021-05-21 DIAGNOSIS — I779 Disorder of arteries and arterioles, unspecified: Secondary | ICD-10-CM | POA: Insufficient documentation

## 2021-05-21 DIAGNOSIS — R42 Dizziness and giddiness: Secondary | ICD-10-CM

## 2021-05-21 DIAGNOSIS — Z8546 Personal history of malignant neoplasm of prostate: Secondary | ICD-10-CM | POA: Insufficient documentation

## 2021-05-27 ENCOUNTER — Other Ambulatory Visit: Payer: Self-pay | Admitting: *Deleted

## 2021-05-27 DIAGNOSIS — I6523 Occlusion and stenosis of bilateral carotid arteries: Secondary | ICD-10-CM

## 2021-06-03 DIAGNOSIS — L570 Actinic keratosis: Secondary | ICD-10-CM | POA: Diagnosis not present

## 2021-06-03 DIAGNOSIS — L814 Other melanin hyperpigmentation: Secondary | ICD-10-CM | POA: Diagnosis not present

## 2021-06-03 DIAGNOSIS — L821 Other seborrheic keratosis: Secondary | ICD-10-CM | POA: Diagnosis not present

## 2021-06-03 DIAGNOSIS — D1801 Hemangioma of skin and subcutaneous tissue: Secondary | ICD-10-CM | POA: Diagnosis not present

## 2021-06-03 DIAGNOSIS — L820 Inflamed seborrheic keratosis: Secondary | ICD-10-CM | POA: Diagnosis not present

## 2021-06-08 ENCOUNTER — Ambulatory Visit: Payer: Medicare Other | Admitting: Internal Medicine

## 2021-06-08 ENCOUNTER — Encounter: Payer: Self-pay | Admitting: Internal Medicine

## 2021-06-08 VITALS — BP 108/59 | HR 67 | Ht 69.0 in | Wt 169.4 lb

## 2021-06-08 DIAGNOSIS — I77810 Thoracic aortic ectasia: Secondary | ICD-10-CM

## 2021-06-08 DIAGNOSIS — E785 Hyperlipidemia, unspecified: Secondary | ICD-10-CM | POA: Diagnosis not present

## 2021-06-08 DIAGNOSIS — Z01812 Encounter for preprocedural laboratory examination: Secondary | ICD-10-CM | POA: Diagnosis not present

## 2021-06-08 DIAGNOSIS — I771 Stricture of artery: Secondary | ICD-10-CM

## 2021-06-08 DIAGNOSIS — I6523 Occlusion and stenosis of bilateral carotid arteries: Secondary | ICD-10-CM | POA: Diagnosis not present

## 2021-06-08 MED ORDER — EZETIMIBE 10 MG PO TABS: 10.0000 mg | ORAL_TABLET | Freq: Every day | ORAL | 3 refills | Status: DC

## 2021-06-08 NOTE — Progress Notes (Signed)
? ?OFFICE CONSULT NOTE ? ?Chief Complaint:  ?Follow-up testing ? ?Primary Care Physician: ?Miguel Rail, MD ? ?HPI:  ?Miguel Joseph is a 81 y.o. male who is being seen today for the evaluation of dizziness, shortness of breath at the request of Miguel Joseph, Miguel Lick, MD. 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  He reports some longstanding shortness of breath but also recently has had acute on chronic shortness of breath, per tickly walking upstairs.  He has reported several episodes of dizziness.  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  He also had a similar experience when he went to the [employer] to review a show.  He has had a couple episodes of dizziness as well which seem to be positional.  His PCP felt that this might be a positional vertigo but further evaluation with cardiology was recommended.  He denies any chest pain.  He was noted to have some coronary artery calcification by CT scan that was performed in May 2021.  He denies any significant early onset heart disease in his family and reported that his father was a professor at [employer]. ? ?06/08/2021 ? ?Mr. Miguel Joseph returns today for follow-up of testing. He underwent echocardiography on May 20, 2021 which showed normal LVEF 55 to 23%, mild diastolic dysfunction, dilated aorta to 41 mm and mild biatrial enlargement.  I do not believe that this would explain his shortness of breath which she says is actually improved somewhat.  The left carotid bruit was actually a left subclavian bruit.  Dopplers demonstrated increased velocity of 437 cm/s in the left subclavian suggestive of severe stenosis.  The left arm systolic blood pressure was 102 mmHg in the right arm systolic pressure was 536 mmHg.  He  denies any left arm claudication symptoms.  To have some mild bilateral carotid artery disease.  Lipids in March of this year showed total cholesterol 169, HDL 51, LDL 99 and triglycerides 91.  He is on 40 mg of pravastatin which she is taken for many years.  His goal LDL should be less than 70. ? ?PMHx:  ?Past Medical History:  ?Diagnosis Date  ? Chronic kidney disease   ? managed by Miguel. Pearson Joseph. Stage III chronic kidney disease. Stable.  ? Diverticulosis   ? E. coli UTI (urinary tract infection) june 2021 and 2014  ? Rx: Cipro  ? History of anal fissures yrs ago  ? Hyperlipidemia   ? Prostate cancer ([place])   ? UTI (urinary tract infection)   ? on amoxicillian  ? ? ?Past Surgical History:  ?Procedure Laterality Date  ? APPENDECTOMY  1963  ? CATARACT EXTRACTION, BILATERAL    ? COLONOSCOPY  2001 &;2007;2016  ? internal hemorrhoids; Miguel Joseph  ? PROSTATE BIOPSY  06/28/2019  ? RADIOACTIVE SEED IMPLANT N/A 10/16/2019  ? Procedure: RADIOACTIVE SEED IMPLANT/BRACHYTHERAPY IMPLANT;  Surgeon: Miguel Mons, MD;  Location: [facility];  Service: Urology;  Laterality: N/A;  ? TONSILLECTOMY AND ADENOIDECTOMY  yrs ago  ? adenoids removed not sure about tonsils  ? WISDOM TOOTH EXTRACTION    ? ? ?FAMHx:  ?Family History  ?Problem Relation Age of Onset  ? Breast cancer Mother   ? Ovarian cancer Mother   ?  Testicular cancer Father   ? Breast cancer Sister   ? Pancreatic cancer Paternal Uncle   ?      X2   ? Heart block Paternal Aunt   ?     pacer  ? Colon polyps Son 82  ?     tubular adenoma  ? Diabetes Neg Hx   ? Stroke Neg Hx   ? Heart disease Neg Hx   ? Hypertension Neg Hx   ? Prostate cancer Neg Hx   ? Colon cancer Neg Hx   ? ? ?SOCHx:  ? reports that he has never smoked. He has never used smokeless tobacco. He reports that he does not currently use alcohol after a past usage of about 4.0 standard drinks per week. He reports that he does not use drugs. ? ?ALLERGIES:  ?No Known  Allergies ? ?ROS: ?Pertinent items noted in HPI and remainder of comprehensive ROS otherwise negative. ? ?HOME MEDS: ?Current Outpatient Medications on File Prior to Visit  ?Medication Sig Dispense Refill  ? alfuzosin (UROXATRAL) 10 MG 24 hr tablet Take 10 mg by mouth daily with breakfast.    ? Cyanocobalamin (VITAMIN B12 PO) Take by mouth.    ? moxifloxacin (VIGAMOX) 0.5 % ophthalmic solution Place into the right eye.    ? Multiple Vitamins-Minerals (CENTRUM SILVER) tablet     ? pravastatin (PRAVACHOL) 40 MG tablet TAKE 1 TABLET BY MOUTH EVERY DAY 90 tablet 3  ? prednisoLONE acetate (PRED FORTE) 1 % ophthalmic suspension Place 1 drop into the right eye 4 (four) times daily.    ? Probiotic Product (PROBIOTIC DAILY PO) Take by mouth.    ? ?No current facility-administered medications on file prior to visit.  ? ? ?LABS/IMAGING: ?No results found for this or any previous visit (from the past 48 hour(s)). ?No results found. ? ?LIPID PANEL: ?   ?Component Value Date/Time  ? CHOL 169 04/22/2021 0837  ? CHOL 237 (H) 12/19/2013 1042  ? TRIG 91.0 04/22/2021 0837  ? TRIG 179 (H) 12/19/2013 1042  ? HDL 51.60 04/22/2021 0837  ? HDL 40 12/19/2013 1042  ? CHOLHDL 3 04/22/2021 0837  ? VLDL 18.2 04/22/2021 0837  ? Miguel Joseph 99 04/22/2021 0837  ? LDLCALC 161 (H) 12/19/2013 1042  ? LDLDIRECT 164.8 04/10/2008 1015  ? ? ?WEIGHTS: ?Wt Readings from Last 3 Encounters:  ?06/08/21 169 lb 6.4 oz (76.8 kg)  ?05/06/21 169 lb (76.7 kg)  ?05/05/21 168 lb (76.2 kg)  ? ? ?VITALS: ?BP (!) 108/59   Pulse 67   Ht '5\' 9"'$  (1.753 m)   Wt 169 lb 6.4 oz (76.8 kg)   SpO2 97%   BMI 25.02 kg/m?  ? ?EXAM: ?Deferred ? ?EKG: ?Deferred ? 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 ?Joseph: ?1.   Miguel Joseph reports that his dizziness is improved as well as his shortness of breath.  He has been doing some home vestibular exercises.  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  He denies any left arm claudication.  Blood pressure should be taken only in the right arm as this would be the accurate systemic blood pressure.  He will  need better lipid control.  I advised adding Zetia 10 mg daily to help reach a goal LDL less than 70.  We will repeat a lipid profile in about 3 months.  Joseph then follow-up with me in a year which time we will get a CT angio of the aorta to look at his ascending aortic aneurysm and left subclavian stenosis. ? ?Pixie Casino, MD, [facility], FACP  ?[place]  ?Medical Director of the Advanced Lipid Disorders &  ?Cardiovascular Risk Reduction Clinic ?Diplomate of the [employer] of Clinical Lipidology ?Attending Cardiologist  ?Direct Dial: [phone]  Fax: [phone]  ?Website:  [email or web address] ? ?Nadean Corwin Margueritte Guthridge ?06/08/2021, 10:30 AM ?

## 2021-06-08 NOTE — Patient Instructions (Addendum)
Medication Instructions:  ?START zetia '10mg'$  daily ? ?*If you need a refill on your cardiac medications before your next appointment, please call your pharmacy* ? ? ?Lab Work: ?FASTING lab work to assess cholesterol in about 3-4 months ? ?If you have labs (blood work) drawn today and your tests are completely normal, you will receive your results only by: ?MyChart Message (if you have MyChart) OR ?A paper copy in the mail ?If you have any lab test that is abnormal or we need to change your treatment, we will call you to review the results. ? ? ?Testing/Procedures: ?CT angiogram chest/aorta in 12 months ? ? ?Follow-Up: ?At [facility], you and your health needs are our priority.  As part of our continuing mission to provide you with exceptional heart care, we have created designated Provider Care Teams.  These Care Teams include your primary Cardiologist (physician) and Advanced Practice Providers (APPs -  Physician Assistants and Nurse Practitioners) who all work together to provide you with the care you need, when you need it. ? ?We recommend signing up for the patient portal called "MyChart".  Sign up information is provided on this After Visit Summary.  MyChart is used to connect with patients for Virtual Visits (Telemedicine).  Patients are able to view lab/test results, encounter notes, upcoming appointments, etc.  Non-urgent messages can be sent to your provider as well.   ?To learn more about what you can do with MyChart, go to [email or web address].   ? ?Your next appointment:   ?12 month(s) ? ?The format for your next appointment:   ?In Person ? ?Provider:   ?Dr. Debara Pickett ? ?

## 2021-06-28 ENCOUNTER — Encounter: Payer: Self-pay | Admitting: Internal Medicine

## 2021-07-07 DIAGNOSIS — M48062 Spinal stenosis, lumbar region with neurogenic claudication: Secondary | ICD-10-CM | POA: Diagnosis not present

## 2021-07-08 ENCOUNTER — Encounter: Payer: Self-pay | Admitting: Internal Medicine

## 2021-07-29 DIAGNOSIS — M5116 Intervertebral disc disorders with radiculopathy, lumbar region: Secondary | ICD-10-CM | POA: Diagnosis not present

## 2021-07-29 DIAGNOSIS — M5117 Intervertebral disc disorders with radiculopathy, lumbosacral region: Secondary | ICD-10-CM | POA: Diagnosis not present

## 2021-07-29 DIAGNOSIS — M5416 Radiculopathy, lumbar region: Secondary | ICD-10-CM | POA: Diagnosis not present

## 2021-08-23 ENCOUNTER — Encounter: Payer: Self-pay | Admitting: Internal Medicine

## 2021-08-24 ENCOUNTER — Encounter: Payer: Self-pay | Admitting: Internal Medicine

## 2021-08-25 ENCOUNTER — Encounter: Payer: Self-pay | Admitting: Family Medicine

## 2021-08-25 ENCOUNTER — Ambulatory Visit (INDEPENDENT_AMBULATORY_CARE_PROVIDER_SITE_OTHER): Payer: Medicare Other | Admitting: Family Medicine

## 2021-08-25 VITALS — BP 128/68 | HR 69 | Temp 97.6°F | Ht 69.0 in | Wt 167.0 lb

## 2021-08-25 DIAGNOSIS — R490 Dysphonia: Secondary | ICD-10-CM | POA: Diagnosis not present

## 2021-08-25 NOTE — Assessment & Plan Note (Signed)
3 wk h/o hoarseness. Exam unremarkable. No URI or GERD symptoms. Referral to ENT

## 2021-08-25 NOTE — Progress Notes (Signed)
Subjective:     Patient ID: Miguel Joseph, male    DOB: 09/11/1940, 81 y.o.   MRN: [id number]  Chief Complaint  Patient presents with   Hoarse    About 3 weeks ago he noticed he has started to lose his voice but since then has gotten better some. This week it has come back and states it is hard to talk noticed that hoarseness has worsened as well.    HPI Patient is in today for a 3 week hx of hoarse voice. No URI symptoms. No sore throat or painful swallowing.  No acid reflux.  Hoarseness is worse during throughout the day, better is the morning   Denies fever, chills, chest pain, palpitations, shortness of breath, abdominal pain, N/V/D.  There are no preventive care reminders to display for this patient.  Past Medical History:  Diagnosis Date   Chronic kidney disease    managed by Dr. Pearson Grippe. Stage III chronic kidney disease. Stable.   Diverticulosis    E. coli UTI (urinary tract infection) june 2021 and 2014   Rx: Cipro   History of anal fissures yrs ago   Hyperlipidemia    Prostate cancer [facility])    UTI (urinary tract infection)    on amoxicillian    Past Surgical History:  Procedure Laterality Date   APPENDECTOMY  1963   CATARACT EXTRACTION, BILATERAL     COLONOSCOPY  2001 &;2007;2016   internal hemorrhoids; Dr Fuller Plan   PROSTATE BIOPSY  06/28/2019   RADIOACTIVE SEED IMPLANT N/A 10/16/2019   Procedure: RADIOACTIVE SEED IMPLANT/BRACHYTHERAPY IMPLANT;  Surgeon: Ceasar Mons, MD;  Location: [facility];  Service: Urology;  Laterality: N/A;   TONSILLECTOMY AND ADENOIDECTOMY  yrs ago   adenoids removed not sure about tonsils   WISDOM TOOTH EXTRACTION      Family History  Problem Relation Age of Onset   Breast cancer Mother    Ovarian cancer Mother    Testicular cancer Father    Breast cancer Sister    Pancreatic cancer Paternal Uncle         X2    Heart block Paternal Aunt        pacer   Colon polyps Son 67       tubular  adenoma   Diabetes Neg Hx    Stroke Neg Hx    Heart disease Neg Hx    Hypertension Neg Hx    Prostate cancer Neg Hx    Colon cancer Neg Hx     Social History   Socioeconomic History   Marital status: Married    Spouse name: Not on file   Number of children: 3   Years of education: Not on file   Highest education level: Professional school degree (e.g., MD, DDS, DVM, JD)  Occupational History   Occupation: retired Forensic psychologist  Tobacco Use   Smoking status: Never   Smokeless tobacco: Never  Vaping Use   Vaping Use: Never used  Substance and Sexual Activity   Alcohol use: Not Currently    Alcohol/week: 4.0 standard drinks of alcohol    Types: 4 Glasses of wine per week    Comment: occ wine   Drug use: Never   Sexual activity: Yes  Other Topics Concern   Not on file  Social History Narrative   ** Merged History Encounter **       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  He has three grown children.  Social Determinants of Health   Financial Resource Strain: Low Risk  (11/18/2020)   Overall Financial Resource Strain (CARDIA)    Difficulty of Paying Living Expenses: Not hard at all  Food Insecurity: No Food Insecurity (11/18/2020)   Hunger Vital Sign    Worried About Running Out of Food in the Last Year: Never true    Ran Out of Food in the Last Year: Never true  Transportation Needs: No Transportation Needs (11/18/2020)   PRAPARE - Hydrologist (Medical): No    Lack of Transportation (Non-Medical): No  Physical Activity: Sufficiently Active (11/18/2020)   Exercise Vital Sign    Days of Exercise per Week: 5 days    Minutes of Exercise per Session: 30 min  Stress: No Stress Concern Present (11/18/2020)   [place]    Feeling of Stress : Not at all  Social Connections: [place] (11/18/2020)   Social Connection and Isolation Panel [NHANES]     Frequency of Communication with Friends and Family: More than three times a week    Frequency of Social Gatherings with Friends and Family: More than three times a week    Attends Religious Services: More than 4 times per year    Active Member of [employer] or Organizations: Yes    Attends Music therapist: More than 4 times per year    Marital Status: Married  Human resources officer Violence: Not At Risk (11/18/2020)   Humiliation, Afraid, Rape, and Kick questionnaire    Fear of Current or Ex-Partner: No    Emotionally Abused: No    Physically Abused: No    Sexually Abused: No    Outpatient Medications Prior to Visit  Medication Sig Dispense Refill   alfuzosin (UROXATRAL) 10 MG 24 hr tablet Take 10 mg by mouth daily with breakfast.     Cyanocobalamin (VITAMIN B12 PO) Take by mouth.     ezetimibe (ZETIA) 10 MG tablet Take 1 tablet (10 mg total) by mouth daily. 90 tablet 3   Multiple Vitamins-Minerals (CENTRUM SILVER) tablet      pravastatin (PRAVACHOL) 40 MG tablet TAKE 1 TABLET BY MOUTH EVERY DAY 90 tablet 3   Probiotic Product (PROBIOTIC DAILY PO) Take by mouth.     moxifloxacin (VIGAMOX) 0.5 % ophthalmic solution Place into the right eye. (Patient not taking: Reported on 08/25/2021)     prednisoLONE acetate (PRED FORTE) 1 % ophthalmic suspension Place 1 drop into the right eye 4 (four) times daily. (Patient not taking: Reported on 08/25/2021)     No facility-administered medications prior to visit.    No Known Allergies  ROS     Objective:    Physical Exam Constitutional:      General: He is not in acute distress.    Appearance: He is not ill-appearing.  HENT:     Right Ear: Tympanic membrane and ear canal normal.     Left Ear: Ear canal normal. Tympanic membrane is scarred.     Mouth/Throat:     Mouth: Mucous membranes are moist.     Pharynx: Oropharynx is clear.  Eyes:     Extraocular Movements: Extraocular movements intact.     Conjunctiva/sclera: Conjunctivae  normal.     Pupils: Pupils are equal, round, and reactive to light.  Cardiovascular:     Rate and Rhythm: Normal rate.  Pulmonary:     Effort: Pulmonary effort is normal.  Musculoskeletal:     Cervical  back: Normal range of motion and neck supple. No tenderness.  Lymphadenopathy:     Head:     Right side of head: No submental, submandibular or tonsillar adenopathy.     Left side of head: No submental, submandibular or tonsillar adenopathy.     Cervical: No cervical adenopathy.     Upper Body:     Right upper body: No supraclavicular adenopathy.     Left upper body: No supraclavicular adenopathy.  Skin:    General: Skin is warm and dry.  Neurological:     Mental Status: He is alert and oriented to person, place, and time.     Cranial Nerves: No facial asymmetry.     Motor: Motor function is intact.  Psychiatric:        Mood and Affect: Mood normal.     Comments: Coarse voice      BP 128/68 (BP Location: Right Arm, Patient Position: Sitting, Cuff Size: Large)   Pulse 69   Temp 97.6 F (36.4 C) (Temporal)   Ht '5\' 9"'$  (1.753 m)   Wt 167 lb (75.8 kg)   SpO2 97%   BMI 24.66 kg/m  Wt Readings from Last 3 Encounters:  08/25/21 167 lb (75.8 kg)  06/08/21 169 lb 6.4 oz (76.8 kg)  05/06/21 169 lb (76.7 kg)       Assessment & Plan:   Problem List Items Addressed This Visit       Other   Persistent hoarseness - Primary    3 wk h/o hoarseness. Exam unremarkable. No URI or GERD symptoms. Referral to ENT      Relevant Orders   Ambulatory referral to ENT    I have discontinued [place] "gene"'s moxifloxacin and prednisoLONE acetate. I am also having him maintain his alfuzosin, Cyanocobalamin (VITAMIN B12 PO), Probiotic Product (PROBIOTIC DAILY PO), pravastatin, Centrum Silver, and ezetimibe.  No orders of the defined types were placed in this encounter.

## 2021-08-25 NOTE — Patient Instructions (Signed)
I referred you to an ENT (ear, nose and throat specialist).  They will call you to schedule a visit.   If you have not heard from them in 2 weeks, let me know.

## 2021-09-06 DIAGNOSIS — H35363 Drusen (degenerative) of macula, bilateral: Secondary | ICD-10-CM | POA: Diagnosis not present

## 2021-09-06 DIAGNOSIS — H26491 Other secondary cataract, right eye: Secondary | ICD-10-CM | POA: Diagnosis not present

## 2021-09-07 DIAGNOSIS — R490 Dysphonia: Secondary | ICD-10-CM | POA: Diagnosis not present

## 2021-09-07 DIAGNOSIS — K219 Gastro-esophageal reflux disease without esophagitis: Secondary | ICD-10-CM | POA: Diagnosis not present

## 2021-09-27 DIAGNOSIS — E785 Hyperlipidemia, unspecified: Secondary | ICD-10-CM | POA: Diagnosis not present

## 2021-09-28 LAB — LIPID PANEL
Chol/HDL Ratio: 3.2 ratio (ref 0.0–5.0)
Cholesterol, Total: 155 mg/dL (ref 100–199)
HDL: 49 mg/dL (ref >39)
LDL Chol Calc (NIH): 92 mg/dL (ref 0–99)
Triglycerides: 73 mg/dL (ref 0–149)
VLDL Cholesterol Cal: 14 mg/dL (ref 5–40)

## 2021-10-01 ENCOUNTER — Encounter: Payer: Self-pay | Admitting: Internal Medicine

## 2021-10-06 ENCOUNTER — Encounter: Payer: Self-pay | Admitting: Internal Medicine

## 2021-10-10 IMAGING — CR DG CHEST 2V
2 series · 2 of 2 positions shown · non-contrast
Comparison: None.

CLINICAL DATA: Fever.

EXAM:
CHEST - 2 VIEW

[w chest pa]
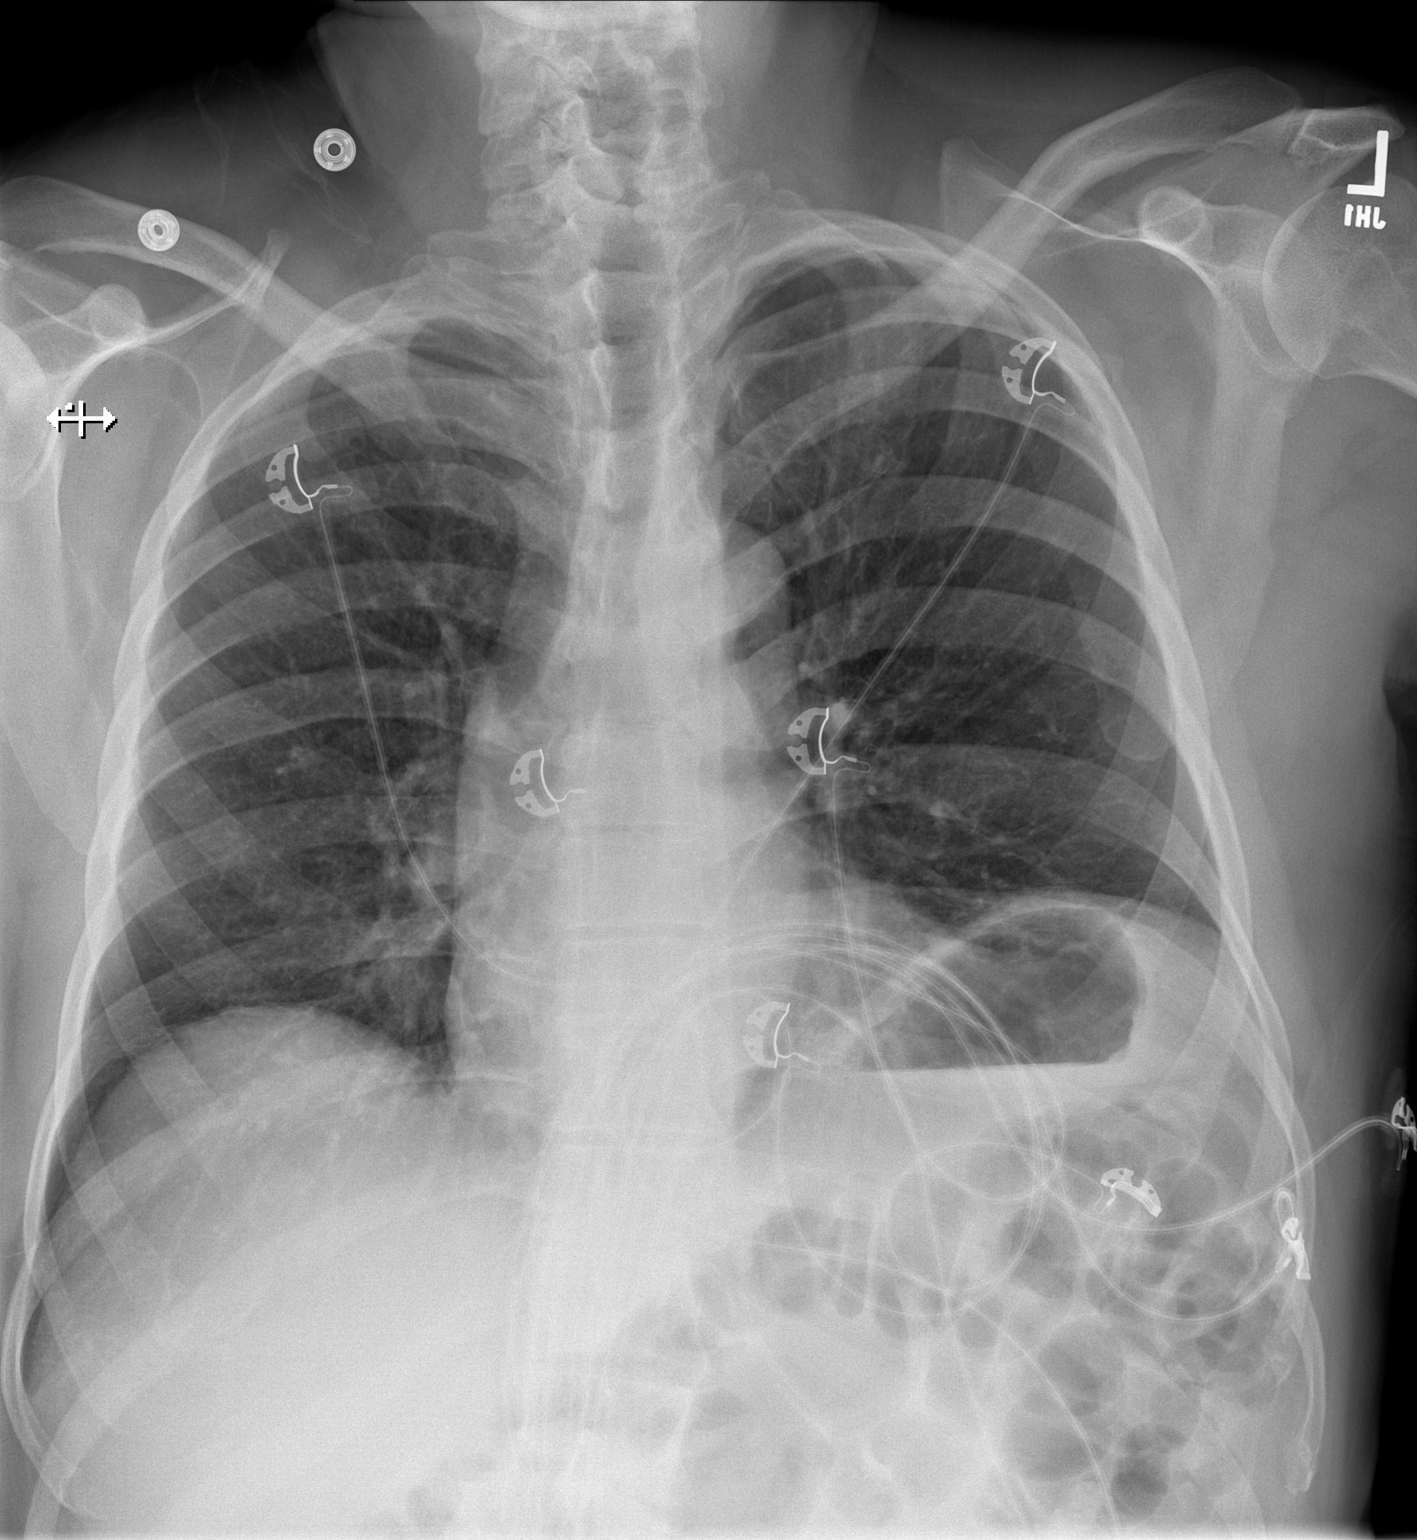

[w chest lat]
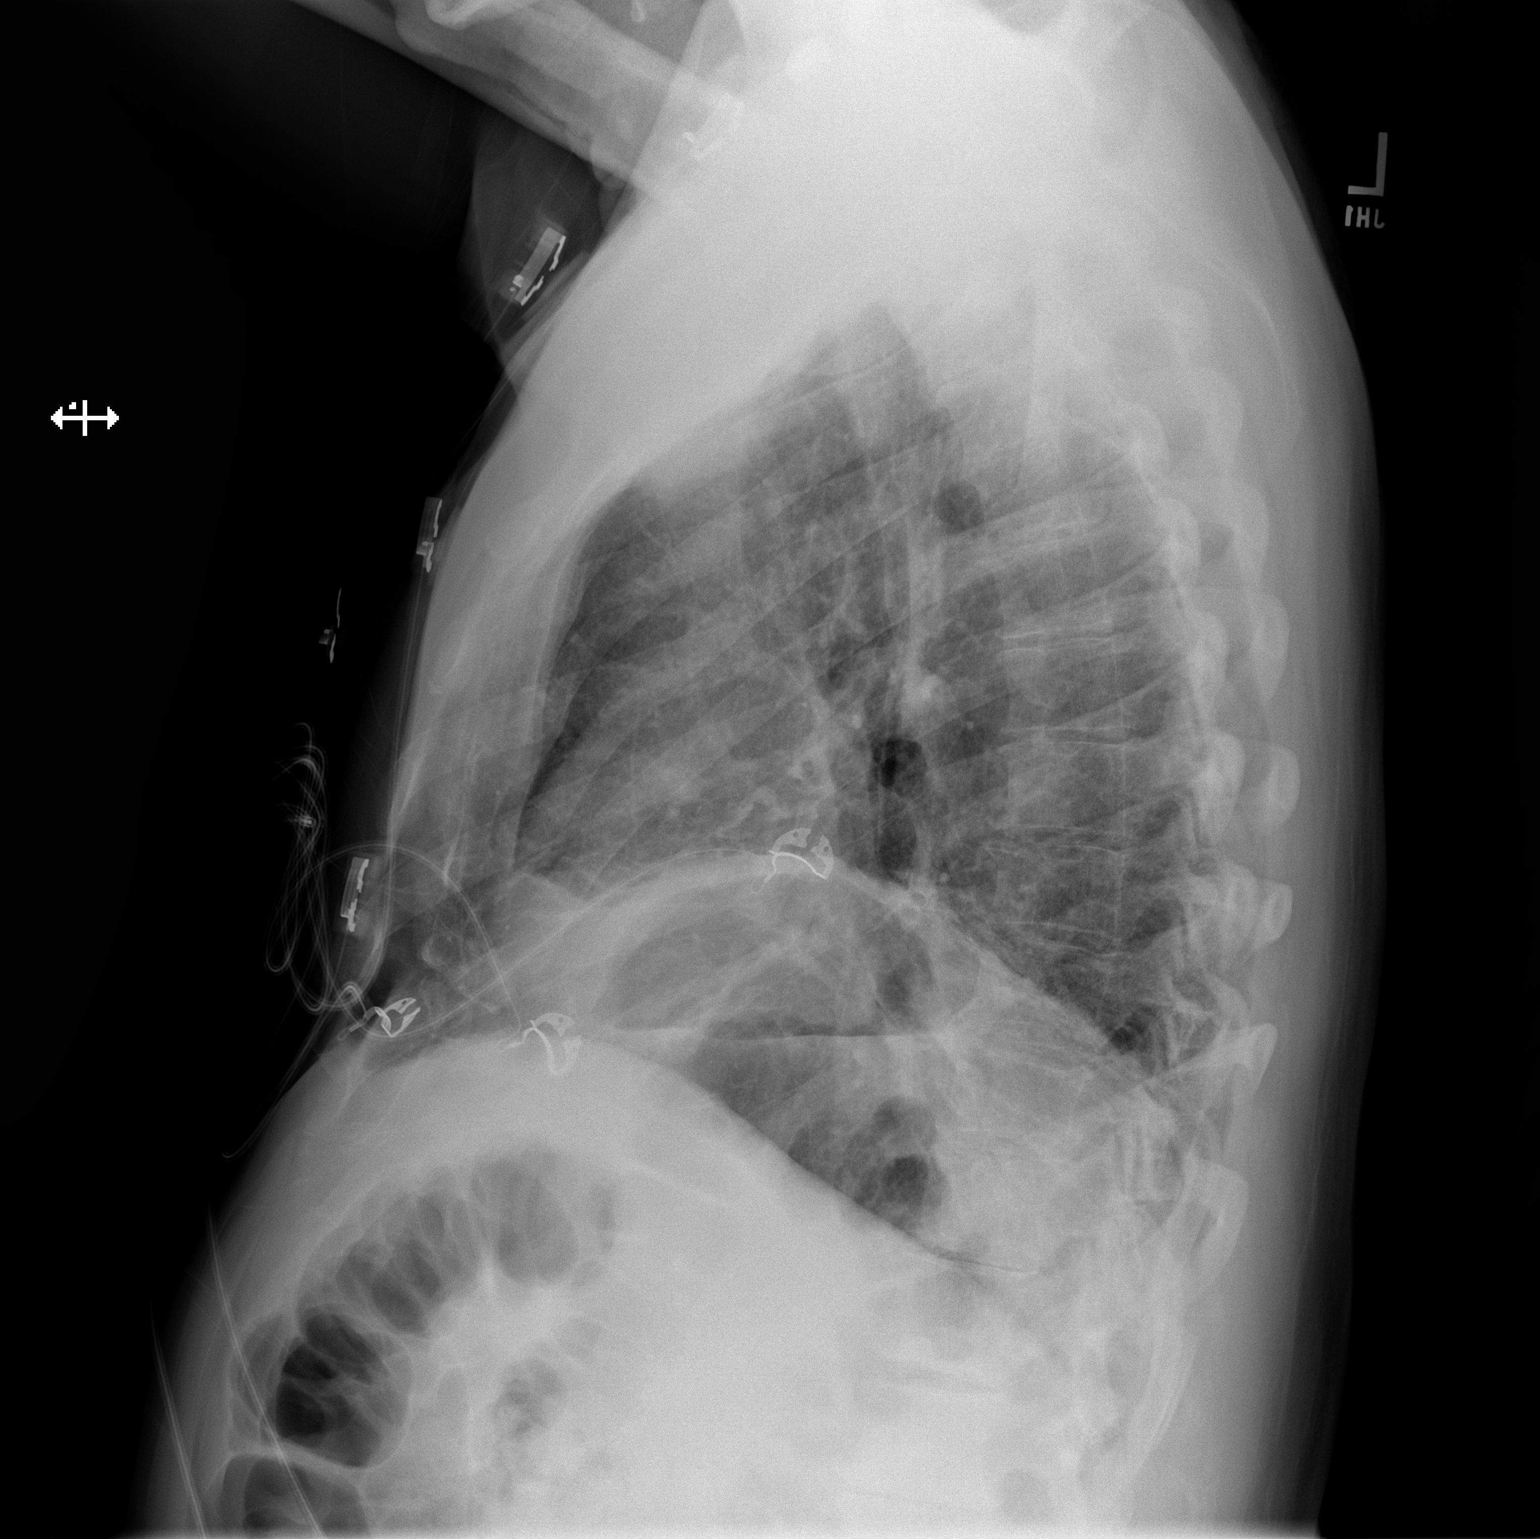

[2 of 2 positions shown; findings below may reference images not displayed]

FINDINGS: The cardiomediastinal silhouette is unremarkable.

Elevation of the LEFT hemidiaphragm is noted with mild LEFT basilar
atelectasis/scarring.

There is no evidence of focal airspace disease, pulmonary edema,
suspicious pulmonary nodule/mass, pleural effusion, or pneumothorax.

No acute bony abnormalities are identified.
IMPRESSION: Mild LEFT basilar atelectasis/scarring. No other significant
abnormality.

## 2021-10-22 ENCOUNTER — Encounter: Payer: Self-pay | Admitting: Internal Medicine

## 2021-10-27 ENCOUNTER — Encounter: Payer: Self-pay | Admitting: Internal Medicine

## 2021-11-05 IMAGING — NM NM BONE WHOLE BODY
2 series · 2 of 2 positions shown · non-contrast
Comparison: 07/06/2019

CLINICAL DATA: Prostate cancer, worsening low back pain

EXAM:
NUCLEAR MEDICINE WHOLE BODY BONE SCAN
TECHNIQUE: Whole body anterior and posterior images were obtained approximately
3 hours after intravenous injection of radiopharmaceutical.
RADIOPHARMACEUTICALS:  20.5 mCi Dechnetium-AAm MDP IV

[Series 1: wbr_bone_40 whole body · 2.66mm/px · 1 of 1 slices shown (1 of 2)]
[im 1/1]
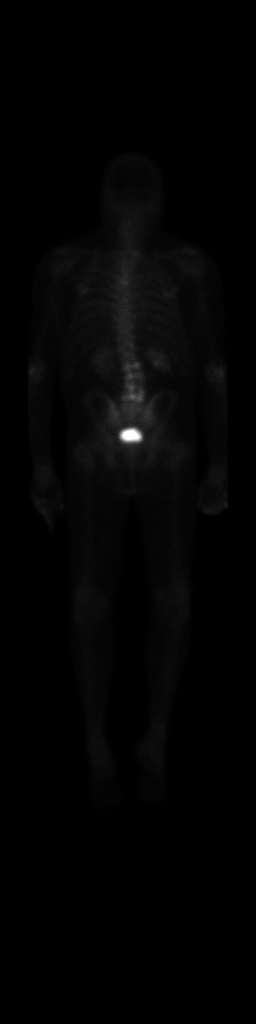

[Series 1: wbr_bone_40 whole body · 2.66mm/px · 1 of 1 slices shown (2 of 2)]
[im 1/1]
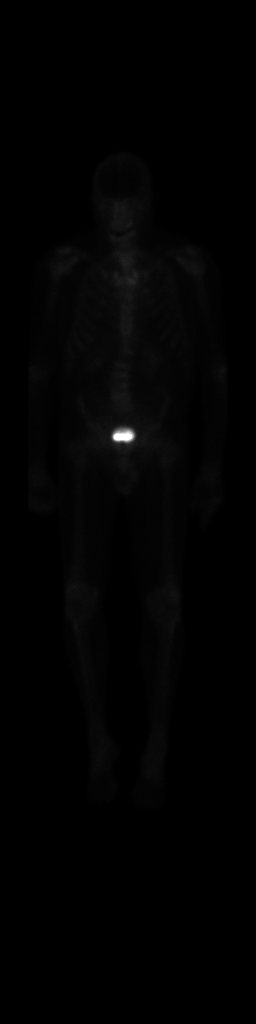

[2 of 2 positions shown; findings below may reference images not displayed]

FINDINGS: Anterior and posterior whole body planar images are obtained after
radiotracer administration. There is normal physiologic excretion of
radiotracer within the kidneys and bladder.

Increased radiotracer activity throughout the lumbar spine
corresponds to extensive multilevel spondylosis, facet hypertrophy,
and right convex scoliosis seen on prior CT.

There is no abnormal radiotracer activity to suggest bony metastatic
disease.
IMPRESSION: 1. No evidence of bony metastases.
2. Activity in the lumbar spine consistent with spondylosis and
facet hypertrophy seen on previous CT.

## 2021-11-22 ENCOUNTER — Ambulatory Visit (INDEPENDENT_AMBULATORY_CARE_PROVIDER_SITE_OTHER): Payer: Medicare Other

## 2021-11-22 DIAGNOSIS — Z0000 Encounter for general adult medical examination without abnormal findings: Secondary | ICD-10-CM

## 2021-11-22 NOTE — Progress Notes (Signed)
Subjective:   Miguel Joseph is a 81 y.o. male who presents for Medicare Annual/Subsequent preventive examination.  I connected with Gene today by telephone and verified that I am speaking with the correct person using two identifiers. I discussed the limitations, risks, security and privacy concerns of performing an evaluation and management service by telephone and the availability of in person appointments. I also discussed with the patient that there may be a patient responsible charge related to this service. The patient expressed understanding and agreed to proceed. Location patient: home Location provider: [place] Persons participating in the visit: Gene and Jillene Bucks, [place].  Time Spent with patient on telephone encounter: 20 minutes  Review of Systems    No ROS. Medicare Wellness Telephone Visit. Additional risk factors are reflected in social history. Cardiac Risk Factors include: advanced age (>15mn, >>79women);dyslipidemia;male gender     Objective:    Today's Vitals   11/22/21 0954  PainSc: 2    There is no height or weight on file to calculate BMI.     11/22/2021   10:17 AM 11/18/2020    2:51 PM 11/06/2019    9:29 AM 10/16/2019    6:56 AM 08/14/2019   12:51 PM 08/13/2019    9:31 AM  Advanced Directives  Does Patient Have a Medical Advance Directive? Yes Yes Yes Yes Yes Yes  Type of AParamed[place]of [place] will;Out of facility DNR (pink MOST or yellow form)  Healthcare Power of [place] Living will;Healthcare Power of Attorney  Does patient want to make changes to medical advance directive? No - Patient declined No - Patient declined No - Patient declined  No - Patient declined No - Patient declined  Copy of [place] Chart? Yes - validated most recent copy scanned in chart (See row information)  No - copy requested   No - copy  requested  Would patient like information on creating a medical advance directive? No - Patient declined         Current Medications (verified) Outpatient Encounter Medications as of 11/22/2021  Medication Sig   alfuzosin (UROXATRAL) 10 MG 24 hr tablet Take 10 mg by mouth daily with breakfast.   Cyanocobalamin (VITAMIN B12 PO) Take by mouth.   ezetimibe (ZETIA) 10 MG tablet Take 1 tablet (10 mg total) by mouth daily.   Multiple Vitamins-Minerals (CENTRUM SILVER) tablet    omeprazole (PRILOSEC) 40 MG capsule SMARTSIG:1 Capsule(s) By Mouth Morning-Evening   pravastatin (PRAVACHOL) 40 MG tablet TAKE 1 TABLET BY MOUTH EVERY DAY   Probiotic Product (PROBIOTIC DAILY PO) Take by mouth.   No facility-administered encounter medications on file as of 11/22/2021.    Allergies (verified) Patient has no known allergies.   History: Past Medical History:  Diagnosis Date   Chronic kidney disease    managed by Dr. RPearson Grippe Stage III chronic kidney disease. Stable.   Diverticulosis    E. coli UTI (urinary tract infection) june 2021 and 2014   Rx: Cipro   History of anal fissures yrs ago   Hyperlipidemia    Prostate cancer ([facility]    UTI (urinary tract infection)    on amoxicillian   Past Surgical History:  Procedure Laterality Date   APPENDECTOMY  1963   CATARACT EXTRACTION, BILATERAL     COLONOSCOPY  2001 &;2007;2016   internal hemorrhoids; Dr SFuller Plan  PROSTATE BIOPSY  06/28/2019   RADIOACTIVE SEED  IMPLANT N/A 10/16/2019   Procedure: RADIOACTIVE SEED IMPLANT/BRACHYTHERAPY IMPLANT;  Surgeon: Ceasar Mons, MD;  [facility];  Service: Urology;  Laterality: N/A;   TONSILLECTOMY AND ADENOIDECTOMY  yrs ago   adenoids removed not sure about tonsils   WISDOM TOOTH EXTRACTION     Family History  Problem Relation Age of Onset   Breast cancer Mother    Ovarian cancer Mother    Testicular cancer Father    Breast cancer Sister    Pancreatic cancer  Paternal Uncle         X2    Heart block Paternal Aunt        pacer   Colon polyps Son 10       tubular adenoma   Diabetes Neg Hx    Stroke Neg Hx    Heart disease Neg Hx    Hypertension Neg Hx    Prostate cancer Neg Hx    Colon cancer Neg Hx    Social History   Socioeconomic History   Marital status: Married    Spouse name: Not on file   Number of children: 3   Years of education: Not on file   Highest education level: Professional school degree (e.g., MD, DDS, DVM, JD)  Occupational History   Occupation: retired Forensic psychologist  Tobacco Use   Smoking status: Never   Smokeless tobacco: Never  Vaping Use   Vaping Use: Never used  Substance and Sexual Activity   Alcohol use: Not Currently    Alcohol/week: 4.0 standard drinks of alcohol    Types: 4 Glasses of wine per week    Comment: occ wine   Drug use: Never   Sexual activity: Yes  Other Topics Concern   Not on file  Social History Narrative   ** Merged History Encounter **       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  He has three grown children.   Social Determinants of Health   Financial Resource Strain: Low Risk  (11/22/2021)   Overall Financial Resource Strain (CARDIA)    Difficulty of Paying Living Expenses: Not hard at all  Food Insecurity: No Food Insecurity (11/22/2021)   Hunger Vital Sign    Worried About Running Out of Food in the Last Year: Never true    Ran Out of Food in the Last Year: Never true  Transportation Needs: No Transportation Needs (11/22/2021)   PRAPARE - Hydrologist (Medical): No    Lack of Transportation (Non-Medical): No  Physical Activity: Insufficiently Active (11/22/2021)   Exercise Vital Sign    Days of Exercise per Week: 7 days    Minutes of Exercise per Session: 20 min  Stress: No Stress Concern Present (11/22/2021)   [place]    Feeling of Stress : Not at all  Social  Connections: Moderately Isolated (11/22/2021)   Social Connection and Isolation Panel [NHANES]    Frequency of Communication with Friends and Family: More than three times a week    Frequency of Social Gatherings with Friends and Family: Twice a week    Attends Religious Services: Never    Marine scientist or Organizations: No    Attends Music therapist: Never    Marital Status: Married    Tobacco Counseling Counseling given: Not Answered   Clinical Intake:  Pre-visit preparation completed: Yes  Pain : 0-10 Pain Score: 2  Pain  Type: Chronic pain Pain Location: Back Pain Orientation: Lower Pain Descriptors / Indicators: Aching Pain Onset: More than a month ago Pain Frequency: Constant     Nutritional Status: BMI of 19-24  Normal Nutritional Risks: None Diabetes: No  How often do you need to have someone help you when you read instructions, pamphlets, or other written materials from your doctor or pharmacy?: 1 - Never What is the last grade level you completed in school?: college and law school    Interpreter Needed?: No  Information entered by :: Jillene Bucks, CMA   Activities of Daily Living    11/22/2021   10:19 AM  In your present state of health, do you have any difficulty performing the following activities:  Hearing? 1  Comment some hearing difficulty  Vision? 1  Difficulty concentrating or making decisions? 0  Walking or climbing stairs? 0  Dressing or bathing? 0  Doing errands, shopping? 0  Preparing Food and eating ? N  Using the Toilet? N  In the past six months, have you accidently leaked urine? N  Do you have problems with loss of bowel control? N  Managing your Medications? N  Managing your Finances? N  Housekeeping or managing your Housekeeping? N    Patient Care Team: Binnie Rail, MD as PCP - General (Internal Medicine) Kristeen Miss, MD as Consulting Physician (Neurosurgery) Rolm Bookbinder, MD as Consulting  Physician (Dermatology) Ceasar Mons, MD as Consulting Physician (Urology) Rexene Agent, MD as Attending Physician (Nephrology) Clent Jacks, MD as Consulting Physician (Ophthalmology)  Indicate any recent Medical Services you may have received from other than [facility] providers in the past year (date may be approximate).     Assessment:   This is a routine wellness examination for Miguel Joseph.  Hearing/Vision screen Patient reports some hearing difficultly, wears hearing aids at times.  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   Dietary issues and exercise activities discussed: Current Exercise Habits: Home exercise routine, Type of exercise: stretching, Time (Minutes): 20, Frequency (Times/Week): 7, Weekly Exercise (Minutes/Week): 140, Intensity: Mild, Exercise limited by: None identified   Goals Addressed             This Visit's Progress    Patient Stated       Would like to not make my back any worse and to work on it getting better.        Depression Screen    11/22/2021   10:15 AM 08/25/2021    3:14 PM 11/18/2020    2:32 PM 08/14/2019   12:52 PM 04/16/2019   10:04 AM 02/12/2018    8:01 AM 02/09/2017    8:09 AM  PHQ 2/9 Scores  PHQ - 2 Score 0 0 0 0 0 0 0    Fall Risk    11/22/2021   10:19 AM 08/25/2021    3:14 PM 11/18/2020    2:52 PM 08/14/2019   12:52 PM 04/16/2019   10:03 AM  Fall Risk   Falls in the past year? 0 0 0 0 0  Number falls in past yr: 0 0 0 0 0  Injury with Fall? 0 0 0 0 0  Risk for fall due to : No Fall Risks No Fall Risks No Fall Risks No Fall Risks   Follow up Falls evaluation completed Falls evaluation completed Falls evaluation completed Falls evaluation completed     [place]:  Any stairs in or around the  home? Yes  If so, are there any without handrails? No  Home free of loose throw rugs in walkways, pet beds, electrical cords, etc? Yes  Adequate lighting in your home to  reduce risk of falls? Yes   ASSISTIVE DEVICES UTILIZED TO PREVENT FALLS:  Life alert? No  Use of a cane, walker or w/c? Yes walker SOMETIMES for balance Grab bars in the bathroom? Yes  Shower chair or bench in shower? Yes  Elevated toilet seat or a handicapped toilet? No   TIMED UP AND GO:  Was the test performed? No phone visit.  Length of time to ambulate 10 feet: N/A sec.   Gait steady and fast with assistive device  Cognitive Function:  Patient is cogitatively intact.      11/22/2021   10:21 AM  6CIT Screen  What Year? 0 points  What month? 0 points  What time? 0 points  Count back from 20 0 points  Months in reverse 0 points  Repeat phrase 0 points  Total Score 0 points    Immunizations Immunization History  Administered Date(s) Administered   Fluad Quad(high Dose 65+) 10/18/2018, 11/29/2020   Influenza Split 10/31/2011   Influenza Whole 12/26/2008   Influenza, High Dose Seasonal PF 12/17/2014, 11/28/2016, 11/13/2017, 11/04/2019, 10/26/2021   Influenza-Unspecified 11/07/2012, 10/28/2013, 11/30/2015   PFIZER(Purple Top)SARS-COV-2 Vaccination 02/20/2019, 03/12/2019, 11/04/2019, 05/15/2020, 10/29/2020   Pfizer Covid-19 Vaccine Bivalent Booster 22yr & up 10/29/2020, 06/28/2021   Pneumococcal Conjugate-13 01/09/2015   Pneumococcal Polysaccharide-23 02/02/2016   Respiratory Syncytial Virus Vaccine,Recomb Aduvanted(Arexvy) 10/06/2021   Tetanus 10/21/2013   Zoster Recombinat (Shingrix) 07/08/2021, 09/29/2021   Zoster, Live 11/11/2013    TDAP status: Up to date  Flu Vaccine status: Up to date  Pneumococcal vaccine status: Up to date  Covid-19 vaccine status: Completed vaccines  Qualifies for Shingles Vaccine? Yes   Zostavax completed No   Shingrix Completed?: Yes  Screening Tests Health Maintenance  Topic Date Due   COVID-19 Vaccine (7 - Pfizer risk series) 12/08/2021 (Originally 08/23/2021)   TETANUS/TDAP  10/22/2023   Pneumonia Vaccine 81 Years old   Completed   INFLUENZA VACCINE  Completed   Zoster Vaccines- Shingrix  Completed   HPV VACCINES  Aged Out    Health Maintenance  There are no preventive care reminders to display for this patient.   Colorectal cancer screening: No longer required.   Lung Cancer Screening: (Low Dose CT Chest recommended if Age 786-80years, 30 pack-year currently smoking OR have quit w/in 15years.) does not qualify.   Lung Cancer Screening Referral: N/A  Additional Screening:  Hepatitis C Screening: does not qualify; Completed N/A  Vision Screening: Recommended annual ophthalmology exams for early detection of glaucoma and other disorders of the eye. Is the patient up to date with their annual eye exam?  Yes  Who is the provider or what is the name of the [facility] which the patient [facility]  If pt is not established with a provider, would they like to be referred to a provider to establish care? No .   Dental Screening: Recommended annual dental exams for proper oral hygiene  Community Resource Referral / Chronic Care Management: CRR required this visit?  No   CCM required this visit?  No      Plan:     I have personally reviewed and noted the following in the patient's chart:   Medical and social history Use of alcohol, tobacco or illicit drugs  Current medications  and supplements including opioid prescriptions. Patient is not currently taking opioid prescriptions. Functional ability and status Nutritional status Physical activity Advanced directives List of other physicians Hospitalizations, surgeries, and ER visits in previous 12 months Vitals Screenings to include cognitive, depression, and falls Referrals and appointments  In addition, I have reviewed and discussed with patient certain preventive protocols, quality metrics, and best practice recommendations. A written personalized care plan for preventive services as well as general  preventive health recommendations were provided to patient.     Rossie Muskrat, CMA   11/22/2021   Nurse Notes: N/A

## 2022-01-17 DIAGNOSIS — M48062 Spinal stenosis, lumbar region with neurogenic claudication: Secondary | ICD-10-CM | POA: Diagnosis not present

## 2022-02-04 ENCOUNTER — Encounter (HOSPITAL_BASED_OUTPATIENT_CLINIC_OR_DEPARTMENT_OTHER): Payer: Self-pay | Admitting: Emergency Medicine

## 2022-02-04 ENCOUNTER — Emergency Department (HOSPITAL_BASED_OUTPATIENT_CLINIC_OR_DEPARTMENT_OTHER)
Admission: EM | Admit: 2022-02-04 | Discharge: 2022-02-04 | Disposition: A | Discharge status: Home / Self Care | Payer: Medicare Other | Attending: Emergency Medicine | Admitting: Emergency Medicine

## 2022-02-04 ENCOUNTER — Emergency Department (HOSPITAL_BASED_OUTPATIENT_CLINIC_OR_DEPARTMENT_OTHER): Payer: Medicare Other | Admitting: Radiology

## 2022-02-04 ENCOUNTER — Other Ambulatory Visit: Payer: Self-pay

## 2022-02-04 DIAGNOSIS — Z8546 Personal history of malignant neoplasm of prostate: Secondary | ICD-10-CM | POA: Diagnosis not present

## 2022-02-04 DIAGNOSIS — R059 Cough, unspecified: Secondary | ICD-10-CM | POA: Diagnosis not present

## 2022-02-04 DIAGNOSIS — R5383 Other fatigue: Secondary | ICD-10-CM | POA: Insufficient documentation

## 2022-02-04 DIAGNOSIS — Z20822 Contact with and (suspected) exposure to covid-19: Secondary | ICD-10-CM | POA: Diagnosis not present

## 2022-02-04 DIAGNOSIS — R0602 Shortness of breath: Secondary | ICD-10-CM | POA: Insufficient documentation

## 2022-02-04 DIAGNOSIS — R051 Acute cough: Secondary | ICD-10-CM | POA: Insufficient documentation

## 2022-02-04 DIAGNOSIS — N189 Chronic kidney disease, unspecified: Secondary | ICD-10-CM | POA: Diagnosis not present

## 2022-02-04 LAB — RESP PANEL BY RT-PCR (RSV, FLU A&B, COVID)  RVPGX2
Influenza A by PCR: NEGATIVE
Influenza B by PCR: NEGATIVE
Resp Syncytial Virus by PCR: NEGATIVE
SARS Coronavirus 2 by RT PCR: NEGATIVE

## 2022-02-04 LAB — GROUP A STREP BY PCR: Group A Strep by PCR: NOT DETECTED

## 2022-02-04 MED ORDER — FENTANYL CITRATE PF 50 MCG/ML IJ SOSY
50.0000 ug | PREFILLED_SYRINGE | Freq: Once | INTRAMUSCULAR | Status: DC
  Filled 2022-02-04: qty 1

## 2022-02-04 MED ORDER — PREDNISONE 20 MG PO TABS: 40.0000 mg | ORAL_TABLET | Freq: Every day | ORAL | 0 refills | Status: DC

## 2022-02-04 MED ORDER — GUAIFENESIN-DM 100-10 MG/5ML PO SYRP: 5.0000 mL | ORAL_SOLUTION | Freq: Three times a day (TID) | ORAL | 0 refills | Status: DC | PRN

## 2022-02-04 NOTE — ED Triage Notes (Signed)
Pt arrives to ED with c/o cough x1 week.

## 2022-02-04 NOTE — ED Notes (Signed)
Pt given discharge instructions and reviewed prescriptions. Opportunities given for questions. Pt verbalizes understanding. Curtis Cain R, RN 

## 2022-02-04 NOTE — ED Provider Notes (Signed)
[facility] EMERGENCY DEPT Provider Note   CSN: [id number] Arrival date & time: 02/04/22  0630     History  Chief Complaint  Patient presents with   Cough    Miguel Joseph is a 81 y.o. male.   Cough Patient had a cough for around a week.  Minimal sputum production.  No known sick contacts.  States the cough did not sound the way it sounds.  No fevers.  Does not really feel short of breath but does feel little fatigued.    Past Medical History:  Diagnosis Date   Chronic kidney disease    managed by Dr. Pearson Grippe. Stage III chronic kidney disease. Stable.   Diverticulosis    E. coli UTI (urinary tract infection) june 2021 and 2014   Rx: Cipro   History of anal fissures yrs ago   Hyperlipidemia    Prostate cancer ([place])    UTI (urinary tract infection)    on amoxicillian    Home Medications Prior to Admission medications   Medication Sig Start Date End Date Taking? Authorizing Provider  guaiFENesin-dextromethorphan (ROBITUSSIN DM) 100-10 MG/5ML syrup Take 5 mLs by mouth 3 (three) times daily as needed for cough. 02/04/22  Yes Davonna Belling, MD  predniSONE (DELTASONE) 20 MG tablet Take 2 tablets (40 mg total) by mouth daily. 02/04/22  Yes Davonna Belling, MD  alfuzosin (UROXATRAL) 10 MG 24 hr tablet Take 10 mg by mouth daily with breakfast.    [provider]  Cyanocobalamin (VITAMIN B12 PO) Take by mouth.    [provider]  ezetimibe (ZETIA) 10 MG tablet Take 1 tablet (10 mg total) by mouth daily. 06/08/21 06/03/22  Pixie Casino, MD  gabapentin (NEURONTIN) 100 MG capsule Take by mouth. 01/17/22   [provider]  Multiple Vitamins-Minerals (CENTRUM SILVER) tablet  06/03/21   [provider]  omeprazole (PRILOSEC) 40 MG capsule SMARTSIG:1 Capsule(s) By Mouth Morning-Evening 11/10/21   [provider]  pravastatin (PRAVACHOL) 40 MG tablet TAKE 1 TABLET BY MOUTH EVERY DAY 04/15/21   Binnie Rail, MD   Probiotic Product (PROBIOTIC DAILY PO) Take by mouth.    [provider]      Allergies    Patient has no known allergies.    Review of Systems   Review of Systems  Respiratory:  Positive for cough.     Physical Exam Updated Vital Signs BP 117/65 (BP Location: Right Arm)   Pulse 79   Temp 97.9 F (36.6 C) (Temporal)   Resp 16   Ht '5\' 8"'$  (1.727 m)   Wt 69.9 kg   SpO2 97%   BMI 23.42 kg/m  Physical Exam Vitals and nursing note reviewed.  HENT:     Head: Atraumatic.     Mouth/Throat:     Pharynx: Posterior oropharyngeal erythema present.     Comments: Some posterior pharyngeal erythema with exudate versus mucous on left tonsil. Cardiovascular:     Rate and Rhythm: Regular rhythm.  Pulmonary:     Breath sounds: No wheezing or rhonchi.  Abdominal:     Tenderness: There is no abdominal tenderness.  Musculoskeletal:     Cervical back: Neck supple.  Neurological:     Mental Status: He is alert.     ED Results / Procedures / Treatments   Labs (all labs ordered are listed, but only abnormal results are displayed) Labs Reviewed  RESP PANEL BY RT-PCR (RSV, FLU A&B, COVID)  [id number]  GROUP A STREP BY PCR  EKG None  Radiology DG Chest 2 View  Result Date: 02/04/2022 CLINICAL DATA:  One-week history of cough EXAM: CHEST - 2 VIEW COMPARISON:  Chest radiograph dated 09/19/2019 FINDINGS: Hyperinflated lungs. Unchanged elevation of the left hemidiaphragm. No focal consolidations. No pleural effusion or pneumothorax. The heart size and mediastinal contours are within normal limits. The visualized skeletal structures are unremarkable. IMPRESSION: No active cardiopulmonary disease. Electronically Signed   By: Darrin Nipper M.D.   On: 02/04/2022 09:10    Procedures Procedures    Medications Ordered in ED Medications - No data to display   ED Course/ Medical Decision Making/ A&P                           Medical Decision Making Amount and/or Complexity of Data  Reviewed Radiology: ordered.  Risk OTC drugs. Prescription drug management.   Patient with cough.  Has had for a week.  Chest x-ray reassuring.  Will check flu COVID RSV testing.  Also will check strep due to erythema posteriorly with potential exudate.  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  Potentially could be contributing.    Workup reassuring.  X-ray reassuring.  Flu COVID RSV testing.        Final Clinical Impression(s) / ED Diagnoses Final diagnoses:  Acute cough    Rx / DC Orders ED Discharge Orders          Ordered    guaiFENesin-dextromethorphan (ROBITUSSIN DM) 100-10 MG/5ML syrup  3 times daily PRN        02/04/22 1210    predniSONE (DELTASONE) 20 MG tablet  Daily        02/04/22 1210              Davonna Belling, MD 02/04/22 1538

## 2022-02-07 ENCOUNTER — Encounter (HOSPITAL_BASED_OUTPATIENT_CLINIC_OR_DEPARTMENT_OTHER): Payer: Self-pay | Admitting: *Deleted

## 2022-02-07 ENCOUNTER — Emergency Department (HOSPITAL_BASED_OUTPATIENT_CLINIC_OR_DEPARTMENT_OTHER): Payer: Medicare Other

## 2022-02-07 ENCOUNTER — Other Ambulatory Visit: Payer: Self-pay

## 2022-02-07 ENCOUNTER — Emergency Department (HOSPITAL_BASED_OUTPATIENT_CLINIC_OR_DEPARTMENT_OTHER)
Admission: EM | Admit: 2022-02-07 | Discharge: 2022-02-07 | Disposition: A | Discharge status: Home / Self Care | Payer: Medicare Other | Attending: Emergency Medicine | Admitting: Emergency Medicine

## 2022-02-07 DIAGNOSIS — R059 Cough, unspecified: Secondary | ICD-10-CM | POA: Diagnosis not present

## 2022-02-07 DIAGNOSIS — R051 Acute cough: Secondary | ICD-10-CM | POA: Diagnosis not present

## 2022-02-07 DIAGNOSIS — J189 Pneumonia, unspecified organism: Secondary | ICD-10-CM

## 2022-02-07 DIAGNOSIS — Z1152 Encounter for screening for COVID-19: Secondary | ICD-10-CM | POA: Diagnosis not present

## 2022-02-07 DIAGNOSIS — R0602 Shortness of breath: Secondary | ICD-10-CM | POA: Diagnosis not present

## 2022-02-07 DIAGNOSIS — R509 Fever, unspecified: Secondary | ICD-10-CM | POA: Insufficient documentation

## 2022-02-07 LAB — CBC WITH DIFFERENTIAL/PLATELET
Abs Immature Granulocytes: 0.11 10*3/uL — ABNORMAL HIGH (ref 0.00–0.07)
Basophils Absolute: 0 10*3/uL (ref 0.0–0.1)
Basophils Relative: 0 %
Eosinophils Absolute: 0 10*3/uL (ref 0.0–0.5)
Eosinophils Relative: 0 %
HCT: 38.8 % — ABNORMAL LOW (ref 39.0–52.0)
Hemoglobin: 13 g/dL (ref 13.0–17.0)
Immature Granulocytes: 1 %
Lymphocytes Relative: 7 %
Lymphs Abs: 0.9 10*3/uL (ref 0.7–4.0)
MCH: 31 pg (ref 26.0–34.0)
MCHC: 33.5 g/dL (ref 30.0–36.0)
MCV: 92.6 fL (ref 80.0–100.0)
Monocytes Absolute: 2.1 10*3/uL — ABNORMAL HIGH (ref 0.1–1.0)
Monocytes Relative: 15 %
Neutro Abs: 10.3 10*3/uL — ABNORMAL HIGH (ref 1.7–7.7)
Neutrophils Relative %: 77 %
Platelets: 233 10*3/uL (ref 150–400)
RBC: 4.19 MIL/uL — ABNORMAL LOW (ref 4.22–5.81)
RDW: 14.5 % (ref 11.5–15.5)
WBC: 13.4 10*3/uL — ABNORMAL HIGH (ref 4.0–10.5)
nRBC: 0 % (ref 0.0–0.2)

## 2022-02-07 LAB — URINALYSIS, ROUTINE W REFLEX MICROSCOPIC
Bacteria, UA: NONE SEEN
Bilirubin Urine: NEGATIVE
Glucose, UA: NEGATIVE mg/dL
Ketones, ur: NEGATIVE mg/dL
Leukocytes,Ua: NEGATIVE
Nitrite: NEGATIVE
Protein, ur: NEGATIVE mg/dL
Specific Gravity, Urine: 1.022 (ref 1.005–1.030)
pH: 5.5 (ref 5.0–8.0)

## 2022-02-07 LAB — LACTIC ACID, PLASMA
Lactic Acid, Venous: 1 mmol/L (ref 0.5–1.9)
Lactic Acid, Venous: 2.1 mmol/L (ref 0.5–1.9)
Lactic Acid, Venous: 1.6 mmol/L (ref 0.5–1.9)

## 2022-02-07 LAB — COMPREHENSIVE METABOLIC PANEL
ALT: 23 U/L (ref 0–44)
AST: 23 U/L (ref 15–41)
Albumin: 3.7 g/dL (ref 3.5–5.0)
Alkaline Phosphatase: 43 U/L (ref 38–126)
Anion gap: 11 (ref 5–15)
BUN: 30 mg/dL — ABNORMAL HIGH (ref 8–23)
CO2: 23 mmol/L (ref 22–32)
Calcium: 8.5 mg/dL — ABNORMAL LOW (ref 8.9–10.3)
Chloride: 102 mmol/L (ref 98–111)
Creatinine, Ser: 1.4 mg/dL — ABNORMAL HIGH (ref 0.61–1.24)
GFR, Estimated: 50 mL/min — ABNORMAL LOW (ref >60)
Glucose, Bld: 108 mg/dL — ABNORMAL HIGH (ref 70–99)
Potassium: 3.9 mmol/L (ref 3.5–5.1)
Sodium: 136 mmol/L (ref 135–145)
Total Bilirubin: 0.5 mg/dL (ref 0.3–1.2)
Total Protein: 6.2 g/dL — ABNORMAL LOW (ref 6.5–8.1)

## 2022-02-07 LAB — TROPONIN I (HIGH SENSITIVITY)
Troponin I (High Sensitivity): 5 ng/L (ref <18)
Troponin I (High Sensitivity): 4 ng/L (ref <18)

## 2022-02-07 LAB — RESP PANEL BY RT-PCR (RSV, FLU A&B, COVID)  RVPGX2
Influenza A by PCR: NEGATIVE
Influenza B by PCR: NEGATIVE
Resp Syncytial Virus by PCR: NEGATIVE
SARS Coronavirus 2 by RT PCR: NEGATIVE

## 2022-02-07 LAB — BRAIN NATRIURETIC PEPTIDE: B Natriuretic Peptide: 88.7 pg/mL (ref 0.0–100.0)

## 2022-02-07 MED ORDER — SODIUM CHLORIDE 0.9 % IV SOLN: Freq: Once | INTRAVENOUS | Status: AC

## 2022-02-07 MED ORDER — DOXYCYCLINE HYCLATE 100 MG PO TABS
100.0000 mg | ORAL_TABLET | Freq: Once | ORAL | Status: AC
  Administered 2022-02-07: 100 mg via ORAL
  Filled 2022-02-07: qty 1

## 2022-02-07 MED ORDER — ALBUTEROL SULFATE HFA 108 (90 BASE) MCG/ACT IN AERS
2.0000 | INHALATION_SPRAY | Freq: Once | RESPIRATORY_TRACT | Status: AC
  Administered 2022-02-07: 2 via RESPIRATORY_TRACT
  Filled 2022-02-07: qty 6.7

## 2022-02-07 MED ORDER — DOXYCYCLINE HYCLATE 100 MG PO CAPS: 100.0000 mg | ORAL_CAPSULE | Freq: Two times a day (BID) | ORAL | 0 refills | Status: DC

## 2022-02-07 NOTE — ED Triage Notes (Signed)
Cough and congestion for one week. Pt was seen on 12/29 and d/c. Pt feels sob and symptoms have gotten worse

## 2022-02-07 NOTE — ED Provider Notes (Signed)
[facility] DEPT Provider Note   CSN: [id number] Arrival date & time: 02/07/22  1228     History {Add pertinent medical, surgical, social history, OB history to HPI:1} Chief Complaint  Patient presents with   URI    Miguel Joseph is a 82 y.o. male.   URI      Home Medications Prior to Admission medications   Medication Sig Start Date End Date Taking? Authorizing Provider  alfuzosin (UROXATRAL) 10 MG 24 hr tablet Take 10 mg by mouth daily with breakfast.    [provider]  Cyanocobalamin (VITAMIN B12 PO) Take by mouth.    [provider]  ezetimibe (ZETIA) 10 MG tablet Take 1 tablet (10 mg total) by mouth daily. 06/08/21 06/03/22  Pixie Casino, MD  gabapentin (NEURONTIN) 100 MG capsule Take by mouth. 01/17/22   [provider]  guaiFENesin-dextromethorphan (ROBITUSSIN DM) 100-10 MG/5ML syrup Take 5 mLs by mouth 3 (three) times daily as needed for cough. 02/04/22   Davonna Belling, MD  Multiple Vitamins-Minerals (CENTRUM SILVER) tablet  06/03/21   [provider]  omeprazole (PRILOSEC) 40 MG capsule SMARTSIG:1 Capsule(s) By Mouth Morning-Evening 11/10/21   [provider]  pravastatin (PRAVACHOL) 40 MG tablet TAKE 1 TABLET BY MOUTH EVERY DAY 04/15/21   Binnie Rail, MD  predniSONE (DELTASONE) 20 MG tablet Take 2 tablets (40 mg total) by mouth daily. 02/04/22   Davonna Belling, MD  Probiotic Product (PROBIOTIC DAILY PO) Take by mouth.    [provider]      Allergies    Patient has no known allergies.    Review of Systems   Review of Systems  Physical Exam Updated Vital Signs BP 125/69 (BP Location: Right Arm)   Pulse (!) 115   Temp 100.1 F (37.8 C)   Resp (!) 22   SpO2 96%  Physical Exam  ED Results / Procedures / Treatments   Labs (all labs ordered are listed, but only abnormal results are displayed) Labs Reviewed  CBC WITH DIFFERENTIAL/PLATELET - Abnormal; Notable for the  following components:      Result Value   WBC 13.4 (*)    RBC 4.19 (*)    HCT 38.8 (*)    Neutro Abs 10.3 (*)    Monocytes Absolute 2.1 (*)    Abs Immature Granulocytes 0.11 (*)    All other components within normal limits  COMPREHENSIVE METABOLIC PANEL - Abnormal; Notable for the following components:   Glucose, Bld 108 (*)    BUN 30 (*)    Creatinine, Ser 1.40 (*)    Calcium 8.5 (*)    Total Protein 6.2 (*)    GFR, Estimated 50 (*)    All other components within normal limits  RESP PANEL BY RT-PCR (RSV, FLU A&B, COVID)  RVPGX2  CULTURE, BLOOD (ROUTINE X 2)  CULTURE, BLOOD (ROUTINE X 2)  LACTIC ACID, PLASMA  LACTIC ACID, PLASMA  BRAIN NATRIURETIC PEPTIDE  TROPONIN I (HIGH SENSITIVITY)    EKG None  Radiology DG Chest Portable 1 View  Result Date: 02/07/2022 CLINICAL DATA:  Productive cough, shortness of breath EXAM: PORTABLE CHEST 1 VIEW COMPARISON:  02/04/2022 FINDINGS: The heart size and mediastinal contours are within normal limits. Unchanged elevation of the left hemidiaphragm. The visualized skeletal structures are unremarkable. IMPRESSION: No acute abnormality of the lungs. Unchanged elevation of the left hemidiaphragm. Electronically Signed   By: Delanna Ahmadi M.D.   On: 02/07/2022 14:45    Procedures Procedures  {  Document cardiac monitor, telemetry assessment procedure when appropriate:1}  Medications Ordered in ED Medications  albuterol (VENTOLIN HFA) 108 (90 Base) MCG/ACT inhaler 2 puff (2 puffs Inhalation Given 02/07/22 1522)    ED Course/ Medical Decision Making/ A&P Clinical Course as of 02/07/22 1519  Mon Feb 07, 2022  1515 81 YOM with 1 week of URI symptoms and malaise/SOB. Wheezing and mildly dyspneic today.  Ambulatory sats and reassess.  [CC]    Clinical Course User Index [CC] Tretha Sciara, MD                           Medical Decision Making Amount and/or Complexity of Data Reviewed Labs: ordered. Radiology: ordered.  Risk Prescription  drug management.    Miguel Joseph is a 82 y.o. male with a past medical history significant for CKD, hyperlipidemia, previous prostate cancer, diverticulosis, chronic back pain, and carotid disease who presents with fevers, chills, productive cough, worsening exertional shortness of breath, fatigue, and malaise.  Patient reports that for the last week or so he has had these URI symptoms that been worsening.  He was seen several days ago and was negative for COVID, flu, and RSV but his symptoms have continued to worsen.  He is now feeling more short of breath especially with exertion and feeling winded.  He has not felt like this before.  He denies actual chest pain or chest pressure and denies palpitations.  Denies nausea, vomiting, constipation, diarrhea, or urinary changes.  Denies leg pain or leg swelling.  Denies history of cardiac disease or heart failure.  Denies any trauma.  Denies history of COPD or asthma or significant wheezing troubles in the past.  On exam, patient does have some wheezing and rhonchi.  Chest was nontender.  Abdomen nontender.  Legs are nontender and nonedematous.  Back was nontender although he is reporting some back discomfort with his coughing.  Oxygen saturation was around 91% on room air initially and he was warm to the touch.  He was tachypneic.  Clinically I am concerned the patient is to develop pneumonia or has initially seronegative viral illness.  With his borderline oxygen saturations, will check a repeat viral swab will also get x-ray and labs.  Will get a cardiac workup given the exertional shortness of breath that is new.  With his wheezing we will give some albuterol and reassess.  Anticipate reassessment after workup to determine disposition.  Care transferred to oncoming team to wait for diagnostic workup results.  Anticipate determination of gets hypoxic and workup results to determine disposition.    {Document critical care time when  appropriate:1} {Document review of labs and clinical decision tools ie heart score, Chads2Vasc2 etc:1}  {Document your independent review of radiology images, and any outside records:1} {Document your discussion with family members, caretakers, and with consultants:1} {Document social determinants of health affecting pt's care:1} {Document your decision making why or why not admission, treatments were needed:1} Final Clinical Impression(s) / ED Diagnoses Final diagnoses:  Exertional shortness of breath  Acute cough  Fever, unspecified fever cause    Clinical Impression: 1. Exertional shortness of breath   2. Acute cough   3. Fever, unspecified fever cause     Disposition: Care transferred to oncoming team to await workup results and reassessment.  This note was prepared with assistance of Systems analyst. Occasional wrong-word or sound-a-like substitutions may have occurred due to the inherent limitations of voice recognition software.

## 2022-02-07 NOTE — ED Provider Notes (Signed)
Clinical Course as of 02/07/22 2127  Mon Feb 07, 2022  1515 81 YOM with 1 week of URI symptoms and malaise/SOB. Wheezing and mildly dyspneic today.  Ambulatory sats and reassess.  [CC]  1735 BP(!): 86/50 [CC]  2032 Reassessed at bedside.  Ambulatory sats within normal limits.  No evidence of viral infection.  Clinically dehydrated now improving after IV fluids.  Blood pressure back to normal.  Pending repeat lactic.  Likely atypical pneumonia.  Discussed admission versus discharge with the patient and family.  Given stabilization of vital signs, well appearance, patient likely stable for outpatient care and management. [CC]    Clinical Course User Index [CC] Tretha Sciara, MD   Repeat lactic resulted reassuringly.  Given stabilization of vital signs with a aggressive intervention in the emergency department, well appearance, ambulation status, p.o. status, prolonged observation in the emergency department, patient is stable for outpatient care and management at this time.  Discussed hospitalization as a potential intervention.  However family and patient feel more comfortable with outpatient care and management at this time with reassessment by PCP.  Strict return precautions were reinforced and patient expressed understanding.   Tretha Sciara, MD 02/07/22 2315

## 2022-02-07 NOTE — ED Notes (Signed)
RT note: Pt. was ambulated around room with pulse oximeter for HR/Saturation check while awaiting Nasal swab results, plan to re-evaluate ambulating pulse ox readings once Lab results obtained, Initial results are as follows: Resting HR: 105 / Sat.%: 92%(room air) Ambulating HR: 112 / Sat.% / 92% (room air)

## 2022-02-08 ENCOUNTER — Encounter: Payer: Self-pay | Admitting: Internal Medicine

## 2022-02-10 ENCOUNTER — Telehealth: Payer: Self-pay

## 2022-02-10 ENCOUNTER — Encounter: Payer: Self-pay | Admitting: Internal Medicine

## 2022-02-10 NOTE — Telephone Encounter (Signed)
     Patient  visit on 1/1  at [facility] you been able to follow up with your primary care physician? Yes   The patient was or was not able to obtain any needed medicine or equipment. Yes   Are there diet recommendations that you are having difficulty following? Na   Patient expresses understanding of discharge instructions and education provided has no other needs at this time.  Yes      [place], [facility], Care Management  [phone] [address], [place], [place] [address] Phone: [phone] Email: [email or web address]

## 2022-02-13 LAB — CULTURE, BLOOD (ROUTINE X 2)
Culture: NO GROWTH
Culture: NO GROWTH
Special Requests: ADEQUATE
Special Requests: ADEQUATE

## 2022-02-13 NOTE — Progress Notes (Unsigned)
Subjective:    Patient ID: Miguel Joseph, male    DOB: 1940-11-28, 82 y.o.   MRN: [id number]     HPI [facility] is here for follow up of his respiratory illness, ? Atypical PNA  12/29 ED for cough - covid, flu and rsv neg.  Strep neg.  CXR neg.  Cough x 1 week, minimal sputum, fatigue.  No fever, SOB.  Rx - robitussin DM, prednisone 40 mg x 2 days.  1/1 ED for SOB, cough, fever, cold symptoms - temp 100.1, HR 115, BP nml, O2 96%.  Viral panel neg, CXR neg, blood work not concerning, UA, trop neg, BNP neg.     Received IVF, doxy x 1.  D/c'd home on doxycycline x 10 days  Did not tolerated doxycycline.  His nephew, who is a doctor, put him on azithromycin and Augmentin.  He is feeling better.  He is coughing less.  His SOB is better.  His energy level is still low.   He is here with his wife and they did have a couple questions about his other medications.  Medications and allergies reviewed with patient and updated if appropriate.  Current Outpatient Medications on File Prior to Visit  Medication Sig Dispense Refill   alfuzosin (UROXATRAL) 10 MG 24 hr tablet Take 10 mg by mouth daily with breakfast.     amoxicillin (AMOXIL) 875 MG tablet      azithromycin (ZITHROMAX) 250 MG tablet Take by mouth.     Cyanocobalamin (VITAMIN B12 PO) Take by mouth.     ezetimibe (ZETIA) 10 MG tablet Take 1 tablet (10 mg total) by mouth daily. 90 tablet 3   guaiFENesin-dextromethorphan (ROBITUSSIN DM) 100-10 MG/5ML syrup Take 5 mLs by mouth 3 (three) times daily as needed for cough. 118 mL 0   Multiple Vitamins-Minerals (CENTRUM SILVER) tablet      omeprazole (PRILOSEC) 40 MG capsule SMARTSIG:1 Capsule(s) By Mouth Morning-Evening     pravastatin (PRAVACHOL) 40 MG tablet TAKE 1 TABLET BY MOUTH EVERY DAY 90 tablet 3   Probiotic Product (PROBIOTIC DAILY PO) Take by mouth.     No current facility-administered medications on file prior to visit.     Review of Systems  Constitutional:  Positive for  fatigue. Negative for fever.  HENT:  Negative for congestion, ear pain, sinus pain and sore throat.   Respiratory:  Positive for cough (mostly dry, occ sputum - light yellow in color) and shortness of breath (better). Negative for wheezing.   Cardiovascular:  Negative for chest pain.  Neurological:  Positive for headaches (occ). Negative for light-headedness.       Objective:   Vitals:   02/14/22 1313  BP: 110/68  Pulse: 84  Temp: 98.1 F (36.7 C)  SpO2: 94%   BP Readings from Last 3 Encounters:  02/14/22 110/68  02/07/22 119/65  02/04/22 117/65   Wt Readings from Last 3 Encounters:  02/14/22 159 lb 3.2 oz (72.2 kg)  02/04/22 154 lb (69.9 kg)  08/25/21 167 lb (75.8 kg)   Body mass index is 24.21 kg/m.    Physical Exam Constitutional:      General: He is not in acute distress.    Appearance: Normal appearance. He is not ill-appearing.  HENT:     Head: Normocephalic.     Right Ear: Tympanic membrane, ear canal and external ear normal. There is no impacted cerumen.     Left Ear: Tympanic membrane, ear canal and external ear normal. There  is no impacted cerumen.     Mouth/Throat:     Mouth: Mucous membranes are moist.     Pharynx: No oropharyngeal exudate or posterior oropharyngeal erythema.  Eyes:     Conjunctiva/sclera: Conjunctivae normal.  Cardiovascular:     Rate and Rhythm: Normal rate and regular rhythm.  Pulmonary:     Effort: Pulmonary effort is normal. No respiratory distress.     Breath sounds: Normal breath sounds. No wheezing or rales.  Musculoskeletal:     Cervical back: Neck supple. No tenderness.  Lymphadenopathy:     Cervical: No cervical adenopathy.  Skin:    General: Skin is warm and dry.     Findings: No rash.  Neurological:     Mental Status: He is alert.        Lab Results  Component Value Date   WBC 13.4 (H) 02/07/2022   HGB 13.0 02/07/2022   HCT 38.8 (L) 02/07/2022   PLT 233 02/07/2022   GLUCOSE 108 (H) 02/07/2022   CHOL 155  09/27/2021   TRIG 73 09/27/2021   HDL 49 09/27/2021   LDLDIRECT 164.8 04/10/2008   LDLCALC 92 09/27/2021   ALT 23 02/07/2022   AST 23 02/07/2022   NA 136 02/07/2022   K 3.9 02/07/2022   CL 102 02/07/2022   CREATININE 1.40 (H) 02/07/2022   BUN 30 (H) 02/07/2022   CO2 23 02/07/2022   TSH 2.34 04/22/2021   PSA 12.24 (H) 04/16/2019   INR 1.1 10/15/2019   HGBA1C 6.0 04/22/2021     Assessment & Plan:    See Problem List for Assessment and Plan of chronic medical problems.

## 2022-02-14 ENCOUNTER — Encounter: Payer: Self-pay | Admitting: Internal Medicine

## 2022-02-14 ENCOUNTER — Ambulatory Visit (INDEPENDENT_AMBULATORY_CARE_PROVIDER_SITE_OTHER): Payer: Medicare Other | Admitting: Internal Medicine

## 2022-02-14 VITALS — BP 110/68 | HR 84 | Temp 98.1°F | Ht 68.0 in | Wt 159.2 lb

## 2022-02-14 DIAGNOSIS — J189 Pneumonia, unspecified organism: Secondary | ICD-10-CM

## 2022-02-14 DIAGNOSIS — E7849 Other hyperlipidemia: Secondary | ICD-10-CM

## 2022-02-14 NOTE — Assessment & Plan Note (Signed)
Chronic He wondered why he was on 2 different medications for his cholesterol and if he still needed to take both Discussed he needs both to get him at goal for his LDL Continue Zetia 10 mg daily, which is prescribed by cardiology and pravastatin 40 mg daily which she has done well with

## 2022-02-14 NOTE — Patient Instructions (Addendum)
        Medications changes include :   none.  Complete your antibiotics.      Your symptoms will continue to improve - if they do not let me know.     Return if symptoms worsen or fail to improve.

## 2022-02-14 NOTE — Assessment & Plan Note (Signed)
Acute Improving Currently on azithromycin, Augmentin and symptoms are slowly improving so likely was bacterial Complete antibiotics Symptomatic treatment with over-the-counter cold medications Continue increased rest and fluids Advised that if his symptoms do not continue to improve or worsen he should call, but I do not expect that to happen

## 2022-03-03 DIAGNOSIS — M48062 Spinal stenosis, lumbar region with neurogenic claudication: Secondary | ICD-10-CM | POA: Diagnosis not present

## 2022-03-31 DIAGNOSIS — N1831 Chronic kidney disease, stage 3a: Secondary | ICD-10-CM | POA: Diagnosis not present

## 2022-04-11 DIAGNOSIS — Z923 Personal history of irradiation: Secondary | ICD-10-CM | POA: Diagnosis not present

## 2022-04-11 DIAGNOSIS — M47816 Spondylosis without myelopathy or radiculopathy, lumbar region: Secondary | ICD-10-CM | POA: Diagnosis not present

## 2022-04-11 DIAGNOSIS — N1831 Chronic kidney disease, stage 3a: Secondary | ICD-10-CM | POA: Diagnosis not present

## 2022-04-16 ENCOUNTER — Other Ambulatory Visit: Payer: Self-pay | Admitting: Internal Medicine

## 2022-04-20 ENCOUNTER — Other Ambulatory Visit: Payer: Self-pay | Admitting: Internal Medicine

## 2022-04-24 ENCOUNTER — Encounter: Payer: Self-pay | Admitting: Internal Medicine

## 2022-04-24 NOTE — Patient Instructions (Addendum)
Blood work was ordered.   The lab is on the first floor.    Medications changes include :       A referral was ordered for XXX.     Someone will call you to schedule an appointment.    Return in about 1 year (around 04/25/2023) for Physical Exam.   Health Maintenance, Male Adopting a healthy lifestyle and getting preventive care are important in promoting health and wellness. Ask your health care provider about: The right schedule for you to have regular tests and exams. Things you can do on your own to prevent diseases and keep yourself healthy. What should I know about diet, weight, and exercise? Eat a healthy diet  Eat a diet that includes plenty of vegetables, fruits, low-fat dairy products, and lean protein. Do not eat a lot of foods that are high in solid fats, added sugars, or sodium. Maintain a healthy weight Body mass index (BMI) is a measurement that can be used to identify possible weight problems. It estimates body fat based on height and weight. Your health care provider can help determine your BMI and help you achieve or maintain a healthy weight. Get regular exercise Get regular exercise. This is one of the most important things you can do for your health. Most adults should: Exercise for at least 150 minutes each week. The exercise should increase your heart rate and make you sweat (moderate-intensity exercise). Do strengthening exercises at least twice a week. This is in addition to the moderate-intensity exercise. Spend less time sitting. Even light physical activity can be beneficial. Watch cholesterol and blood lipids Have your blood tested for lipids and cholesterol at 83 years of age, then have this test every 5 years. You may need to have your cholesterol levels checked more often if: Your lipid or cholesterol levels are high. You are older than 83 years of age. You are at high risk for heart disease. What should I know about cancer screening? Many  types of cancers can be detected early and may often be prevented. Depending on your health history and family history, you may need to have cancer screening at various ages. This may include screening for: Colorectal cancer. Prostate cancer. Skin cancer. Lung cancer. What should I know about heart disease, diabetes, and high blood pressure? Blood pressure and heart disease High blood pressure causes heart disease and increases the risk of stroke. This is more likely to develop in people who have high blood pressure readings or are overweight. Talk with your health care provider about your target blood pressure readings. Have your blood pressure checked: Every 3-5 years if you are 15-39 years of age. Every year if you are 31 years old or older. If you are between the ages of 63 and 69 and are a current or former smoker, ask your health care provider if you should have a one-time screening for abdominal aortic aneurysm (AAA). Diabetes Have regular diabetes screenings. This checks your fasting blood sugar level. Have the screening done: Once every three years after age 92 if you are at a normal weight and have a low risk for diabetes. More often and at a younger age if you are overweight or have a high risk for diabetes. What should I know about preventing infection? Hepatitis B If you have a higher risk for hepatitis B, you should be screened for this virus. Talk with your health care provider to find out if you are at risk for  hepatitis B infection. Hepatitis C Blood testing is recommended for: Everyone born from [birth year] through 1966. Anyone with known risk factors for hepatitis C. Sexually transmitted infections (STIs) You should be screened each year for STIs, including gonorrhea and chlamydia, if: You are sexually active and are younger than 83 years of age. You are older than 83 years of age and your health care provider tells you that you are at risk for this type of infection. Your  sexual activity has changed since you were last screened, and you are at increased risk for chlamydia or gonorrhea. Ask your health care provider if you are at risk. Ask your health care provider about whether you are at high risk for HIV. Your health care provider may recommend a prescription medicine to help prevent HIV infection. If you choose to take medicine to prevent HIV, you should first get tested for HIV. You should then be tested every 3 months for as long as you are taking the medicine. Follow these instructions at home: Alcohol use Do not drink alcohol if your health care provider tells you not to drink. If you drink alcohol: Limit how much you have to 0-2 drinks a day. Know how much alcohol is in your drink. In the [place] one drink equals one 12 oz bottle of beer (355 mL), one 5 oz glass of wine (148 mL), or one 1 oz glass of hard liquor (44 mL). Lifestyle Do not use any products that contain nicotine or tobacco. These products include cigarettes, chewing tobacco, and vaping devices, such as e-cigarettes. If you need help quitting, ask your health care provider. Do not use street drugs. Do not share needles. Ask your health care provider for help if you need support or information about quitting drugs. General instructions Schedule regular health, dental, and eye exams. Stay current with your vaccines. Tell your health care provider if: You often feel depressed. You have ever been abused or do not feel safe at home. Summary Adopting a healthy lifestyle and getting preventive care are important in promoting health and wellness. Follow your health care provider's instructions about healthy diet, exercising, and getting tested or screened for diseases. Follow your health care provider's instructions on monitoring your cholesterol and blood pressure. This information is not intended to replace advice given to you by your health care provider. Make sure you discuss any questions you  have with your health care provider. Document Revised: 06/15/2020 Document Reviewed: 06/15/2020 Elsevier Patient Education  [employer].

## 2022-04-25 ENCOUNTER — Ambulatory Visit (INDEPENDENT_AMBULATORY_CARE_PROVIDER_SITE_OTHER): Payer: Medicare Other | Admitting: Internal Medicine

## 2022-04-25 VITALS — BP 110/68 | HR 70 | Temp 98.1°F | Ht 68.0 in | Wt 154.0 lb

## 2022-04-25 DIAGNOSIS — Z0000 Encounter for general adult medical examination without abnormal findings: Secondary | ICD-10-CM | POA: Diagnosis not present

## 2022-04-25 DIAGNOSIS — N1831 Chronic kidney disease, stage 3a: Secondary | ICD-10-CM

## 2022-04-25 DIAGNOSIS — R7303 Prediabetes: Secondary | ICD-10-CM | POA: Diagnosis not present

## 2022-04-25 DIAGNOSIS — I700 Atherosclerosis of aorta: Secondary | ICD-10-CM | POA: Diagnosis not present

## 2022-04-25 DIAGNOSIS — E7849 Other hyperlipidemia: Secondary | ICD-10-CM

## 2022-04-25 DIAGNOSIS — C61 Malignant neoplasm of prostate: Secondary | ICD-10-CM

## 2022-04-25 MED ORDER — ROSUVASTATIN CALCIUM 20 MG PO TABS: 20.0000 mg | ORAL_TABLET | Freq: Every day | ORAL | 3 refills | Status: DC

## 2022-04-25 NOTE — Assessment & Plan Note (Signed)
Chronic Check a1c Low sugar / carb diet Stressed regular exercise  

## 2022-04-25 NOTE — Progress Notes (Signed)
Subjective:    Patient ID: Miguel Joseph, male    DOB: Sep 04, 1940, 82 y.o.   MRN: [id number]     HPI [facility] is here for a physical exam and his chronic medical problems.   Doing well - no concerns.  Still dealing with chronic back pain.    Medications and allergies reviewed with patient and updated if appropriate.  Current Outpatient Medications on File Prior to Visit  Medication Sig Dispense Refill   alfuzosin (UROXATRAL) 10 MG 24 hr tablet Take 10 mg by mouth daily with breakfast.     Cyanocobalamin (VITAMIN B12 PO) Take by mouth.     ezetimibe (ZETIA) 10 MG tablet Take 1 tablet (10 mg total) by mouth daily. 90 tablet 3   Multiple Vitamins-Minerals (CENTRUM SILVER) tablet      omeprazole (PRILOSEC) 40 MG capsule SMARTSIG:1 Capsule(s) By Mouth Morning-Evening     pravastatin (PRAVACHOL) 40 MG tablet TAKE 1 TABLET BY MOUTH EVERY DAY 90 tablet 3   Probiotic Product (PROBIOTIC DAILY PO) Take by mouth.     No current facility-administered medications on file prior to visit.    Review of Systems  Constitutional:  Negative for fever.  Eyes:  Negative for visual disturbance.  Respiratory:  Negative for cough, shortness of breath and wheezing.   Cardiovascular:  Negative for chest pain, palpitations and leg swelling.  Gastrointestinal:  Negative for abdominal pain, blood in stool, constipation and diarrhea.       No gerd  Genitourinary:  Negative for difficulty urinating and hematuria.  Musculoskeletal:  Positive for back pain. Negative for arthralgias.       Occ leg cramps at night  Skin:  Negative for rash.  Neurological:  Negative for light-headedness and headaches.  Psychiatric/Behavioral:  Negative for dysphoric mood. The patient is not nervous/anxious.        Objective:   Vitals:   04/25/22 0750  BP: 110/68  Pulse: 70  Temp: 98.1 F (36.7 C)  SpO2: 97%   Filed Weights   04/25/22 0750  Weight: 154 lb (69.9 kg)   Body mass index is 23.42 kg/m.  BP  Readings from Last 3 Encounters:  04/25/22 110/68  02/14/22 110/68  02/07/22 119/65    Wt Readings from Last 3 Encounters:  04/25/22 154 lb (69.9 kg)  02/14/22 159 lb 3.2 oz (72.2 kg)  02/04/22 154 lb (69.9 kg)      Physical Exam Constitutional: He appears well-developed and well-nourished. No distress.  HENT:  Head: Normocephalic and atraumatic.  Right Ear: External ear normal.  Left Ear: External ear normal.  Normal ear canals and TM b/l  Mouth/Throat: Oropharynx is clear and moist. Eyes: Conjunctivae and EOM are normal.  Neck: Neck supple. No tracheal deviation present. No thyromegaly present.  No carotid bruit  Cardiovascular: Normal rate, regular rhythm, normal heart sounds and intact distal pulses.   No murmur heard.  No lower extremity edema. Pulmonary/Chest: Effort normal and breath sounds normal. No respiratory distress. He has no wheezes. He has no rales.  Abdominal: Soft. He exhibits no distension. There is no tenderness.  Genitourinary: deferred  Lymphadenopathy:   He has no cervical adenopathy.  Skin: Skin is warm and dry. He is not diaphoretic.  Psychiatric: He has a normal mood and affect. His behavior is normal.         Assessment & Plan:   Physical exam: Screening blood work  ordered Exercise   walking 2 miles Weight  normal Substance abuse  none   Reviewed recommended immunizations.   Health Maintenance  Topic Date Due   COVID-19 Vaccine (7 - 2023-24 season) 05/11/2022 (Originally 10/08/2021)   Medicare Annual Wellness (AWV)  11/23/2022   Pneumonia Vaccine 50+ Years old  Completed   INFLUENZA VACCINE  Completed   Zoster Vaccines- Shingrix  Completed   HPV VACCINES  Aged Out   DTaP/Tdap/Td  Discontinued     See Problem List for Assessment and Plan of chronic medical problems.

## 2022-04-25 NOTE — Assessment & Plan Note (Signed)
Chronic Continue Zetia 10 mg daily, pravastatin 40 mg daily Healthy diet, exercise encouraged

## 2022-04-25 NOTE — Assessment & Plan Note (Signed)
Chronic Regular exercise and healthy diet encouraged Check lipid panel, CMP Continue pravastatin 40 mg daily, Zetia 10 mg daily

## 2022-04-25 NOTE — Assessment & Plan Note (Signed)
Chronic Following with Dr. Joelyn Oms once a year Stable CMP, CBC

## 2022-04-25 NOTE — Assessment & Plan Note (Signed)
S/p radioactive seeds psa stable latest was 0.15 Sees winter annually

## 2022-05-16 ENCOUNTER — Ambulatory Visit
Admission: RE | Admit: 2022-05-16 | Discharge: 2022-05-16 | Disposition: A | Discharge status: Home / Self Care | Payer: Medicare Other | Source: Ambulatory Visit | Attending: Internal Medicine | Admitting: Internal Medicine

## 2022-05-16 DIAGNOSIS — J9811 Atelectasis: Secondary | ICD-10-CM | POA: Diagnosis not present

## 2022-05-16 DIAGNOSIS — I77810 Thoracic aortic ectasia: Secondary | ICD-10-CM

## 2022-05-16 DIAGNOSIS — M47814 Spondylosis without myelopathy or radiculopathy, thoracic region: Secondary | ICD-10-CM | POA: Diagnosis not present

## 2022-05-16 DIAGNOSIS — I2510 Atherosclerotic heart disease of native coronary artery without angina pectoris: Secondary | ICD-10-CM | POA: Diagnosis not present

## 2022-05-16 DIAGNOSIS — I771 Stricture of artery: Secondary | ICD-10-CM

## 2022-05-16 MED ORDER — IOPAMIDOL (ISOVUE-370) INJECTION 76%
75.0000 mL | Freq: Once | INTRAVENOUS | Status: AC | PRN
  Administered 2022-05-16: 75 mL via INTRAVENOUS

## 2022-05-18 ENCOUNTER — Other Ambulatory Visit (HOSPITAL_COMMUNITY): Payer: Self-pay | Admitting: Internal Medicine

## 2022-05-18 DIAGNOSIS — I771 Stricture of artery: Secondary | ICD-10-CM

## 2022-05-18 DIAGNOSIS — I6523 Occlusion and stenosis of bilateral carotid arteries: Secondary | ICD-10-CM

## 2022-05-23 ENCOUNTER — Encounter: Payer: Self-pay | Admitting: Internal Medicine

## 2022-05-24 ENCOUNTER — Encounter: Payer: Self-pay | Admitting: Internal Medicine

## 2022-05-24 ENCOUNTER — Ambulatory Visit (INDEPENDENT_AMBULATORY_CARE_PROVIDER_SITE_OTHER): Payer: Medicare Other | Admitting: Internal Medicine

## 2022-05-24 VITALS — BP 118/80 | HR 70 | Temp 98.2°F | Ht 68.0 in | Wt 162.0 lb

## 2022-05-24 DIAGNOSIS — M5416 Radiculopathy, lumbar region: Secondary | ICD-10-CM | POA: Diagnosis not present

## 2022-05-24 DIAGNOSIS — K602 Anal fissure, unspecified: Secondary | ICD-10-CM

## 2022-05-24 DIAGNOSIS — M4716 Other spondylosis with myelopathy, lumbar region: Secondary | ICD-10-CM

## 2022-05-24 MED ORDER — METHYLPREDNISOLONE 4 MG PO TBPK
ORAL_TABLET | ORAL | 0 refills | Status: DC
Start: 2022-05-24 — End: 2022-07-01

## 2022-05-24 MED ORDER — NITROGLYCERIN 2 % TD OINT: TOPICAL_OINTMENT | TRANSDERMAL | 0 refills | Status: DC

## 2022-05-24 NOTE — Patient Instructions (Addendum)
       Medications changes include :   nitroglycerin ointment, medrol dose pak     Return if symptoms worsen or fail to improve.

## 2022-05-24 NOTE — Assessment & Plan Note (Signed)
Acute Had an episode previously years ago Nitroglycerin ointment 2-4 times as needed Discussed possible side effects

## 2022-05-24 NOTE — Assessment & Plan Note (Signed)
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

## 2022-05-24 NOTE — Progress Notes (Signed)
Subjective:    Patient ID: Miguel Joseph, male    DOB: January 12, 1941, 82 y.o.   MRN: [id number]      HPI Shrihan is here for  Chief Complaint  Patient presents with   Hip Pain    Right hip pain; Patient also has some discomfort by rectum area (he was given a cream by Dr. [name] along time ago and wants to have same cream if possible)      Has lumbar spinal stenosis.  Follows with Dr. Margaretmary Dys saw him in January.  Was started on gabapentin the end of last year.  The gabapentin did not help.  He continues to have back pain and numbness feeling in his legs/feet.  He denies pain with sitting, laying, sleeping and resting.  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  Moderate stenosis throughout spinal canal.    He does do PT once a week.  He did it today and felt his muscles were all tight in the lateral hip.  He was able to reproduce some pain pushing on his lateral buttock region  Driving to [place] had pain 7/10 from right lateral hip down right leg.  It is better, but still there.  Certain activities flared up.  He has more numbness than usual in the right leg.  The pain in the right leg is more than usual.  It is hard to tell with his chronic back pain.    BM Q am w/o difficulty, but recently anus is sensitive and irritated.  No blood.  It stings a little.  Had a episode in the past and used an ointment and that helped.    Medications and allergies reviewed with patient and updated if appropriate.  Current Outpatient Medications on File Prior to Visit  Medication Sig Dispense Refill   alfuzosin (UROXATRAL) 10 MG 24 hr tablet Take 10 mg by mouth daily with breakfast.     Cyanocobalamin (VITAMIN B12 PO) Take by mouth.     ezetimibe (ZETIA) 10 MG tablet Take 1 tablet (10 mg total) by mouth daily. 90 tablet 3   Multiple Vitamins-Minerals (CENTRUM SILVER) tablet      omeprazole (PRILOSEC) 40 MG capsule SMARTSIG:1 Capsule(s) By Mouth  Morning-Evening     Probiotic Product (PROBIOTIC DAILY PO) Take by mouth.     rosuvastatin (CRESTOR) 20 MG tablet Take 1 tablet (20 mg total) by mouth daily. 90 tablet 3   No current facility-administered medications on file prior to visit.    Review of Systems     Objective:   Vitals:   05/24/22 1557  BP: 118/80  Pulse: 70  Temp: 98.2 F (36.8 C)  SpO2: 98%   BP Readings from Last 3 Encounters:  05/24/22 118/80  04/25/22 110/68  02/14/22 110/68   Wt Readings from Last 3 Encounters:  05/24/22 162 lb (73.5 kg)  04/25/22 154 lb (69.9 kg)  02/14/22 159 lb 3.2 oz (72.2 kg)   Body mass index is 24.63 kg/m.    Physical Exam Constitutional:      General: He is not in acute distress.    Appearance: Normal appearance. He is not ill-appearing.  Genitourinary:    Comments: Deferred Musculoskeletal:        General: Tenderness (Right lateral buttock region) present.     Right lower leg: No edema.     Left lower leg: No edema.  Skin:    General: Skin is warm and dry.  Findings: No rash.  Neurological:     Mental Status: He is alert.     Sensory: Sensory deficit (Chronic-slightly worse possibly) present.     Gait: Gait abnormal (Chronic).            Assessment & Plan:    See Problem List for Assessment and Plan of chronic medical problems.

## 2022-05-26 ENCOUNTER — Encounter: Payer: Self-pay | Admitting: Internal Medicine

## 2022-05-26 MED ORDER — NIFEDIPINE 0.3 % OINTMENT: 1.0000 | TOPICAL_OINTMENT | Freq: Four times a day (QID) | CUTANEOUS | 0 refills | Status: DC

## 2022-05-30 ENCOUNTER — Ambulatory Visit (HOSPITAL_COMMUNITY)
Admission: RE | Admit: 2022-05-30 | Discharge: 2022-05-30 | Disposition: A | Discharge status: Home / Self Care | Payer: Medicare Other | Source: Ambulatory Visit | Attending: Cardiovascular Disease | Admitting: Cardiovascular Disease

## 2022-05-30 DIAGNOSIS — I771 Stricture of artery: Secondary | ICD-10-CM | POA: Diagnosis not present

## 2022-05-30 DIAGNOSIS — I6523 Occlusion and stenosis of bilateral carotid arteries: Secondary | ICD-10-CM | POA: Diagnosis not present

## 2022-06-01 ENCOUNTER — Other Ambulatory Visit: Payer: Self-pay | Admitting: *Deleted

## 2022-06-01 DIAGNOSIS — Z961 Presence of intraocular lens: Secondary | ICD-10-CM | POA: Diagnosis not present

## 2022-06-01 DIAGNOSIS — H53002 Unspecified amblyopia, left eye: Secondary | ICD-10-CM | POA: Diagnosis not present

## 2022-06-01 DIAGNOSIS — H35363 Drusen (degenerative) of macula, bilateral: Secondary | ICD-10-CM | POA: Diagnosis not present

## 2022-06-01 DIAGNOSIS — I6523 Occlusion and stenosis of bilateral carotid arteries: Secondary | ICD-10-CM

## 2022-06-01 DIAGNOSIS — H52201 Unspecified astigmatism, right eye: Secondary | ICD-10-CM | POA: Diagnosis not present

## 2022-06-01 DIAGNOSIS — I771 Stricture of artery: Secondary | ICD-10-CM

## 2022-06-01 DIAGNOSIS — H5000 Unspecified esotropia: Secondary | ICD-10-CM | POA: Diagnosis not present

## 2022-06-06 ENCOUNTER — Other Ambulatory Visit (INDEPENDENT_AMBULATORY_CARE_PROVIDER_SITE_OTHER): Payer: Medicare Other

## 2022-06-06 DIAGNOSIS — R7303 Prediabetes: Secondary | ICD-10-CM

## 2022-06-06 DIAGNOSIS — N1831 Chronic kidney disease, stage 3a: Secondary | ICD-10-CM | POA: Diagnosis not present

## 2022-06-06 DIAGNOSIS — E7849 Other hyperlipidemia: Secondary | ICD-10-CM | POA: Diagnosis not present

## 2022-06-06 LAB — LIPID PANEL
Cholesterol: 102 mg/dL (ref 0–200)
HDL: 54 mg/dL (ref >39.00)
LDL Cholesterol: 38 mg/dL (ref 0–99)
NonHDL: 48.19
Total CHOL/HDL Ratio: 2
Triglycerides: 51 mg/dL (ref 0.0–149.0)
VLDL: 10.2 mg/dL (ref 0.0–40.0)

## 2022-06-06 LAB — CBC WITH DIFFERENTIAL/PLATELET
Basophils Absolute: 0 10*3/uL (ref 0.0–0.1)
Basophils Relative: 0.3 % (ref 0.0–3.0)
Eosinophils Absolute: 0.2 10*3/uL (ref 0.0–0.7)
Eosinophils Relative: 2.9 % (ref 0.0–5.0)
HCT: 45 % (ref 39.0–52.0)
Hemoglobin: 15.1 g/dL (ref 13.0–17.0)
Lymphocytes Relative: 21.1 % (ref 12.0–46.0)
Lymphs Abs: 1.4 10*3/uL (ref 0.7–4.0)
MCHC: 33.6 g/dL (ref 30.0–36.0)
MCV: 95.2 fl (ref 78.0–100.0)
Monocytes Absolute: 0.8 10*3/uL (ref 0.1–1.0)
Monocytes Relative: 12.2 % — ABNORMAL HIGH (ref 3.0–12.0)
Neutro Abs: 4.3 10*3/uL (ref 1.4–7.7)
Neutrophils Relative %: 63.5 % (ref 43.0–77.0)
Platelets: 192 10*3/uL (ref 150.0–400.0)
RBC: 4.73 Mil/uL (ref 4.22–5.81)
RDW: 15 % (ref 11.5–15.5)
WBC: 6.8 10*3/uL (ref 4.0–10.5)

## 2022-06-06 LAB — COMPREHENSIVE METABOLIC PANEL
ALT: 36 U/L (ref 0–53)
AST: 30 U/L (ref 0–37)
Albumin: 4 g/dL (ref 3.5–5.2)
Alkaline Phosphatase: 38 U/L — ABNORMAL LOW (ref 39–117)
BUN: 30 mg/dL — ABNORMAL HIGH (ref 6–23)
CO2: 27 mEq/L (ref 19–32)
Calcium: 8.8 mg/dL (ref 8.4–10.5)
Chloride: 103 mEq/L (ref 96–112)
Creatinine, Ser: 1.38 mg/dL (ref 0.40–1.50)
GFR: 47.63 mL/min — ABNORMAL LOW (ref >60.00)
Glucose, Bld: 90 mg/dL (ref 70–99)
Potassium: 4.4 mEq/L (ref 3.5–5.1)
Sodium: 138 mEq/L (ref 135–145)
Total Bilirubin: 0.6 mg/dL (ref 0.2–1.2)
Total Protein: 6.3 g/dL (ref 6.0–8.3)

## 2022-06-06 LAB — HEMOGLOBIN A1C: Hgb A1c MFr Bld: 5.8 % (ref 4.6–6.5)

## 2022-06-07 ENCOUNTER — Other Ambulatory Visit: Payer: Self-pay | Admitting: Internal Medicine

## 2022-06-07 DIAGNOSIS — D2372 Other benign neoplasm of skin of left lower limb, including hip: Secondary | ICD-10-CM | POA: Diagnosis not present

## 2022-06-07 DIAGNOSIS — D1801 Hemangioma of skin and subcutaneous tissue: Secondary | ICD-10-CM | POA: Diagnosis not present

## 2022-06-07 DIAGNOSIS — L814 Other melanin hyperpigmentation: Secondary | ICD-10-CM | POA: Diagnosis not present

## 2022-06-07 DIAGNOSIS — L570 Actinic keratosis: Secondary | ICD-10-CM | POA: Diagnosis not present

## 2022-06-07 DIAGNOSIS — L821 Other seborrheic keratosis: Secondary | ICD-10-CM | POA: Diagnosis not present

## 2022-06-09 ENCOUNTER — Encounter: Payer: Self-pay | Admitting: Internal Medicine

## 2022-07-01 ENCOUNTER — Encounter: Payer: Self-pay | Admitting: Internal Medicine

## 2022-07-01 ENCOUNTER — Telehealth (INDEPENDENT_AMBULATORY_CARE_PROVIDER_SITE_OTHER): Payer: Medicare Other | Admitting: Nurse Practitioner

## 2022-07-01 VITALS — BP 130/75 | Temp 98.4°F

## 2022-07-01 DIAGNOSIS — U071 COVID-19: Secondary | ICD-10-CM | POA: Diagnosis not present

## 2022-07-01 MED ORDER — NIRMATRELVIR/RITONAVIR (PAXLOVID) TABLET (RENAL DOSING)
2.0000 | ORAL_TABLET | Freq: Two times a day (BID) | ORAL | 0 refills | Status: AC
Start: 2022-07-01 — End: 2022-07-06

## 2022-07-01 MED ORDER — BENZONATATE 100 MG PO CAPS
100.0000 mg | ORAL_CAPSULE | Freq: Two times a day (BID) | ORAL | 0 refills | Status: DC | PRN
Start: 2022-07-01 — End: 2023-04-25

## 2022-07-01 NOTE — Progress Notes (Signed)
Established Patient Office Visit  An audio/visual tele-health visit was completed today for this patient. I connected with  Miguel Joseph on 07/01/22 utilizing audio/visual technology and verified that I am speaking with the correct person using two identifiers. The patient was located at their home, and I was located at the office of [facility] at [facility] during the encounter. I discussed the limitations of evaluation and management by telemedicine. The patient expressed understanding and agreed to proceed.     Subjective   Patient ID: Miguel Joseph, male    DOB: March 30, 1940  Age: 82 y.o. MRN: [id number]  Chief Complaint  Patient presents with   Covid Positive    Was not feeling well las night, temp was around 99, use at home Covid test, came out positive     Patient woke up in the middle of the night and felt warm, he took his temperature and it was nine 9.8 which is higher than his baseline of 97.  He took an at home COVID test and it was positive.  His symptoms only started last night.  Today he feels a bit fatigued, has a slight headache, and a dry cough.  He has been vaccinated for COVID-19 multiple times most recent vaccine administered 10/26/2021 with the updated version.  Last EGFR was 47.63 and this was collected approximately 1 month ago.    Review of Systems  Constitutional:  Positive for fever and malaise/fatigue. Negative for chills.  HENT:  Negative for congestion.        (+) runny nose  Respiratory:  Positive for cough. Negative for shortness of breath and wheezing.   Cardiovascular:  Negative for chest pain.  Gastrointestinal:  Negative for diarrhea, nausea and vomiting.  Musculoskeletal:  Negative for back pain.  Neurological:  Positive for headaches.      Objective:     BP 130/75   Temp 98.4 F (36.9 C)    Physical Exam Comprehensive physical exam not completed today as office visit was conducted remotely.  Patient appeared fairly well  over video, no signs of acute respiratory distress were noted.  Patient was alert and oriented, and appeared to have appropriate judgment.   No results found for any visits on 07/01/22.    The ASCVD Risk score (Arnett DK, et al., 2019) failed to calculate for the following reasons:   The 2019 ASCVD risk score is only valid for ages 30 to 57    Assessment & Plan:   Problem List Items Addressed This Visit       Other   COVID-19 - Primary    Acute Symptoms fairly mild currently Per shared decision making patient would like to be treated with Paxlovid, I am agreeable to this as he is in a high risk category based on age as well as chronic conditions including chronic kidney disease, coronary artery disease, and hyperlipidemia. Medication review completed and patient was told to hold alfuzosin and rosuvastatin while taking Paxlovid.  He was also told to consider holding the nifedipine ointment.  He reports his understanding. Renal dosing of Paxlovid sent to his pharmacy, also was prescribed Tessalon Perles for cough suppression as needed. He was told to isolate at home until symptoms have resolved and if he must go out to wear a mask while in public.  He reports his understanding. Patient encouraged to call 911 or proceed to the emergency department if symptoms become more severe specifically if he experiences shortness of breath, high fever  that does not respond to Tylenol, becomes lethargic/confused. He reports his understanding.        Relevant Medications   nirmatrelvir/ritonavir, renal dosing, (PAXLOVID) 10 x 150 MG & 10 x 100MG  TABS   benzonatate (TESSALON) 100 MG capsule    Return if symptoms worsen or fail to improve.    Elenore Paddy, NP

## 2022-07-01 NOTE — Assessment & Plan Note (Addendum)
Acute Symptoms fairly mild currently Per shared decision making patient would like to be treated with Paxlovid, I am agreeable to this as he is in a high risk category based on age as well as chronic conditions including chronic kidney disease, coronary artery disease, and hyperlipidemia. Medication review completed and patient was told to hold alfuzosin and rosuvastatin while taking Paxlovid.  He was also told to consider holding the nifedipine ointment.  He reports his understanding. Renal dosing of Paxlovid sent to his pharmacy, also was prescribed Tessalon Perles for cough suppression as needed. He was told to isolate at home until symptoms have resolved and if he must go out to wear a mask while in public.  He reports his understanding. Patient encouraged to call 911 or proceed to the emergency department if symptoms become more severe specifically if he experiences shortness of breath, high fever that does not respond to Tylenol, becomes lethargic/confused. He reports his understanding.

## 2022-07-12 ENCOUNTER — Other Ambulatory Visit: Payer: Self-pay | Admitting: Internal Medicine

## 2022-07-15 DIAGNOSIS — M48062 Spinal stenosis, lumbar region with neurogenic claudication: Secondary | ICD-10-CM | POA: Diagnosis not present

## 2022-07-28 DIAGNOSIS — M48062 Spinal stenosis, lumbar region with neurogenic claudication: Secondary | ICD-10-CM | POA: Diagnosis not present

## 2022-07-28 DIAGNOSIS — M48061 Spinal stenosis, lumbar region without neurogenic claudication: Secondary | ICD-10-CM | POA: Diagnosis not present

## 2022-07-28 DIAGNOSIS — M5126 Other intervertebral disc displacement, lumbar region: Secondary | ICD-10-CM | POA: Diagnosis not present

## 2022-09-09 DIAGNOSIS — M48062 Spinal stenosis, lumbar region with neurogenic claudication: Secondary | ICD-10-CM | POA: Diagnosis not present

## 2022-10-04 DIAGNOSIS — M48062 Spinal stenosis, lumbar region with neurogenic claudication: Secondary | ICD-10-CM | POA: Diagnosis not present

## 2022-10-04 DIAGNOSIS — M545 Low back pain, unspecified: Secondary | ICD-10-CM | POA: Diagnosis not present

## 2022-10-06 ENCOUNTER — Encounter: Payer: Self-pay | Admitting: Internal Medicine

## 2022-10-12 DIAGNOSIS — M48062 Spinal stenosis, lumbar region with neurogenic claudication: Secondary | ICD-10-CM | POA: Diagnosis not present

## 2022-10-12 DIAGNOSIS — M545 Low back pain, unspecified: Secondary | ICD-10-CM | POA: Diagnosis not present

## 2022-10-16 ENCOUNTER — Encounter: Payer: Self-pay | Admitting: Internal Medicine

## 2022-10-17 DIAGNOSIS — M545 Low back pain, unspecified: Secondary | ICD-10-CM | POA: Diagnosis not present

## 2022-10-17 DIAGNOSIS — M48062 Spinal stenosis, lumbar region with neurogenic claudication: Secondary | ICD-10-CM | POA: Diagnosis not present

## 2022-10-24 DIAGNOSIS — M48062 Spinal stenosis, lumbar region with neurogenic claudication: Secondary | ICD-10-CM | POA: Diagnosis not present

## 2022-10-24 DIAGNOSIS — M545 Low back pain, unspecified: Secondary | ICD-10-CM | POA: Diagnosis not present

## 2022-11-02 DIAGNOSIS — M545 Low back pain, unspecified: Secondary | ICD-10-CM | POA: Diagnosis not present

## 2022-11-02 DIAGNOSIS — M48062 Spinal stenosis, lumbar region with neurogenic claudication: Secondary | ICD-10-CM | POA: Diagnosis not present

## 2022-11-21 DIAGNOSIS — M545 Low back pain, unspecified: Secondary | ICD-10-CM | POA: Diagnosis not present

## 2022-11-21 DIAGNOSIS — M48062 Spinal stenosis, lumbar region with neurogenic claudication: Secondary | ICD-10-CM | POA: Diagnosis not present

## 2022-11-22 ENCOUNTER — Ambulatory Visit: Payer: Medicare Other

## 2022-11-22 VITALS — Ht 68.0 in | Wt 150.0 lb

## 2022-11-22 DIAGNOSIS — Z0000 Encounter for general adult medical examination without abnormal findings: Secondary | ICD-10-CM | POA: Diagnosis not present

## 2022-11-22 NOTE — Progress Notes (Signed)
Subjective:   Miguel Joseph is a 82 y.o. male who presents for Medicare Annual/Subsequent preventive examination.  Visit Complete: Virtual I connected with  Miguel Joseph on 11/22/22 by a audio enabled telemedicine application and verified that I am speaking with the correct person using two identifiers.  Patient Location: Home  Provider Location: Office/Clinic  I discussed the limitations of evaluation and management by telemedicine. The patient expressed understanding and agreed to proceed.  Vital Signs: Because this visit was a virtual/telehealth visit, some criteria may be missing or patient reported. Any vitals not documented were not able to be obtained and vitals that have been documented are patient reported.  Patient Medicare AWV questionnaire was completed by the patient on 11/21/2022; I have confirmed that all information answered by patient is correct and no changes since this date.  Cardiac Risk Factors include: advanced age (>81men, >68 women);dyslipidemia;family history of premature cardiovascular disease;male gender     Objective:    Today's Vitals   11/22/22 0917 11/22/22 0920  Weight: 150 lb (68 kg)   Height: 5\' 8"  (1.727 m)   PainSc: 3  3   PainLoc: Back    Body mass index is 22.81 kg/m.     11/22/2022    9:22 AM 02/07/2022   12:42 PM 02/04/2022    8:48 AM 11/22/2021   10:17 AM 11/18/2020    2:51 PM 11/06/2019    9:29 AM 10/16/2019    6:56 AM  Advanced Directives  Does Patient Have a Medical Advance Directive? Yes Yes Yes Yes Yes Yes Yes  Type of Estate agent of [place];Living will Healthcare Power of [place];Living will  Healthcare Power of [place];Living will;Out of facility DNR (pink MOST or yellow form)  Healthcare Power of [employer] Power of Attorney  Does patient want to make changes to medical advance directive? No - Patient declined  No - Patient declined No - Patient declined No - Patient declined No - Patient  declined   Copy of Healthcare Power of Attorney in Chart? Yes - validated most recent copy scanned in chart (See row information)   Yes - validated most recent copy scanned in chart (See row information)  No - copy requested   Would patient like information on creating a medical advance directive?    No - Patient declined       Current Medications (verified) Outpatient Encounter Medications as of 11/22/2022  Medication Sig   alfuzosin (UROXATRAL) 10 MG 24 hr tablet Take 10 mg by mouth daily with breakfast.   benzonatate (TESSALON) 100 MG capsule Take 1 capsule (100 mg total) by mouth 2 (two) times daily as needed for cough.   Cyanocobalamin (VITAMIN B12 PO) Take by mouth.   ezetimibe (ZETIA) 10 MG tablet TAKE ONE TABLET DAILY   Multiple Vitamins-Minerals (CENTRUM SILVER) tablet    nifedipine 0.3 % ointment Apply rectally 4 times a day   omeprazole (PRILOSEC) 40 MG capsule SMARTSIG:1 Capsule(s) By Mouth Morning-Evening   Probiotic Product (PROBIOTIC DAILY PO) Take by mouth.   rosuvastatin (CRESTOR) 20 MG tablet Take 1 tablet (20 mg total) by mouth daily.   No facility-administered encounter medications on file as of 11/22/2022.    Allergies (verified) Doxycycline   History: Past Medical History:  Diagnosis Date   Chronic kidney disease    managed by Dr. Sabra Heck. Stage III chronic kidney disease. Stable.   Diverticulosis    E. coli UTI (urinary tract infection) june 2021 and 2014  Rx: Cipro   History of anal fissures yrs ago   Hyperlipidemia    Prostate cancer (HCC)    UTI (urinary tract infection)    on amoxicillian   Past Surgical History:  Procedure Laterality Date   APPENDECTOMY  1963   CATARACT EXTRACTION, BILATERAL     COLONOSCOPY  2001 &;2007;2016   internal hemorrhoids; Dr Russella Dar   PROSTATE BIOPSY  06/28/2019   RADIOACTIVE SEED IMPLANT N/A 10/16/2019   Procedure: RADIOACTIVE SEED IMPLANT/BRACHYTHERAPY IMPLANT;  Surgeon: Rene Paci, MD;   [facility];  Service: Urology;  Laterality: N/A;   TONSILLECTOMY AND ADENOIDECTOMY  yrs ago   adenoids removed not sure about tonsils   WISDOM TOOTH EXTRACTION     Family History  Problem Relation Age of Onset   Breast cancer Mother    Ovarian cancer Mother    Testicular cancer Father    Breast cancer Sister    Pancreatic cancer Paternal Uncle         X2    Heart block Paternal Aunt        pacer   Colon polyps Son 47       tubular adenoma   Diabetes Neg Hx    Stroke Neg Hx    Heart disease Neg Hx    Hypertension Neg Hx    Prostate cancer Neg Hx    Colon cancer Neg Hx    Social History   Socioeconomic History   Marital status: Married    Spouse name: Not on file   Number of children: 3   Years of education: Not on file   Highest education level: Professional school degree (e.g., MD, DDS, DVM, JD)  Occupational History   Occupation: retired Pensions consultant  Tobacco Use   Smoking status: Never   Smokeless tobacco: Never  Vaping Use   Vaping status: Never Used  Substance and Sexual Activity   Alcohol use: Not Currently    Alcohol/week: 4.0 standard drinks of alcohol    Types: 4 Glasses of wine per week    Comment: occ wine   Drug use: Never   Sexual activity: Yes  Other Topics Concern   Not on file  Social History Narrative   ** Merged History Encounter **       He is retired Pensions consultant in 2010. He lives with wife.  He has three grown children.   Social Determinants of Health   Financial Resource Strain: Low Risk  (11/22/2022)   Overall Financial Resource Strain (CARDIA)    Difficulty of Paying Living Expenses: Not hard at all  Food Insecurity: No Food Insecurity (11/22/2022)   Hunger Vital Sign    Worried About Running Out of Food in the Last Year: Never true    Ran Out of Food in the Last Year: Never true  Transportation Needs: No Transportation Needs (11/22/2022)   PRAPARE - Administrator, Civil Service (Medical): No     Lack of Transportation (Non-Medical): No  Physical Activity: Sufficiently Active (11/22/2022)   Exercise Vital Sign    Days of Exercise per Week: 7 days    Minutes of Exercise per Session: 50 min  Stress: No Stress Concern Present (11/22/2022)   [employer] of Occupational Health - Occupational Stress Questionnaire    Feeling of Stress : Not at all  Social Connections: Moderately Isolated (11/22/2022)   Social Connection and Isolation Panel [NHANES]    Frequency of Communication with Friends and Family: More than three times a  week    Frequency of Social Gatherings with Friends and Family: More than three times a week    Attends Religious Services: Never    Database administrator or Organizations: No    Attends Engineer, structural: Never    Marital Status: Married    Tobacco Counseling Counseling given: Not Answered   Clinical Intake:  Pre-visit preparation completed: Yes  Pain : 0-10 Pain Score: 3  Pain Type: Chronic pain Pain Location: Back     BMI - recorded: 22.81 Nutritional Status: BMI of 19-24  Normal Nutritional Risks: None Diabetes: No  How often do you need to have someone help you when you read instructions, pamphlets, or other written materials from your doctor or pharmacy?: 1 - Never What is the last grade level you completed in school?: COLLEGE GRADUATE; RETIRED ATTORNEY  Interpreter Needed?: No  Information entered by :: Susie Cassette, LPN.   Activities of Daily Living    11/22/2022    9:24 AM 11/21/2022    7:53 AM  In your present state of health, do you have any difficulty performing the following activities:  Hearing? 1 0  Vision? 0 0  Difficulty concentrating or making decisions? 0 0  Walking or climbing stairs? 0 0  Dressing or bathing? 0 0  Doing errands, shopping? 0 0  Preparing Food and eating ? N   Using the Toilet? N N  In the past six months, have you accidently leaked urine? N N  Do you have problems with loss  of bowel control? N N  Managing your Medications? N N  Managing your Finances? N N  Housekeeping or managing your Housekeeping? N N    Patient Care Team: Pincus Sanes, MD as PCP - General (Internal Medicine) Barnett Abu, MD as Consulting Physician (Neurosurgery) Venancio Poisson, MD as Consulting Physician (Dermatology) Rene Paci, MD as Consulting Physician (Urology) Arita Miss, MD as Attending Physician (Nephrology) Maris Berger, MD as Consulting Physician (Ophthalmology)  Indicate any recent Medical Services you may have received from other than [facility] providers in the past year (date may be approximate).     Assessment:   This is a routine wellness examination for Zamar.  Hearing/Vision screen Hearing Screening - Comments:: Patient has hearing aids. Vision Screening - Comments:: Wears rx glasses - up to date with routine eye exams with Maris Berger, MD. Legally blind in left eye.    Goals Addressed             This Visit's Progress    Continue with physical therapy to get back and legs better.        Depression Screen    07/01/2022   10:12 AM 05/24/2022    4:14 PM 04/25/2022    8:00 AM 02/14/2022    1:12 PM 11/22/2021   10:15 AM 08/25/2021    3:14 PM 11/18/2020    2:32 PM  PHQ 2/9 Scores  PHQ - 2 Score 0 0 0 0 0 0 0  PHQ- 9 Score 0 0 0        Fall Risk    11/22/2022    9:24 AM 11/21/2022    7:53 AM 07/01/2022   10:12 AM 05/24/2022    4:14 PM 04/25/2022    8:00 AM  Fall Risk   Falls in the past year? 0 0 0 0 0  Number falls in past yr: 0 0 0 0 0  Injury with Fall? 0 0 0 0 0  Risk for fall due to : No Fall Risks  No Fall Risks No Fall Risks No Fall Risks  Follow up Falls prevention discussed  Falls evaluation completed Falls evaluation completed Falls evaluation completed    MEDICARE RISK AT HOME: Medicare Risk at Home Any stairs in or around the home?: Yes If so, are there any without handrails?: No Home free of loose throw  rugs in walkways, pet beds, electrical cords, etc?: Yes Adequate lighting in your home to reduce risk of falls?: Yes Life alert?: No Use of a cane, walker or w/c?: Yes Grab bars in the bathroom?: Yes Shower chair or bench in shower?: No Elevated toilet seat or a handicapped toilet?: No  TIMED UP AND GO:  Was the test performed?  No    Cognitive Function:    11/22/2022    9:25 AM  MMSE - Mini Mental State Exam  Not completed: Unable to complete        11/22/2022    9:25 AM 11/22/2021   10:21 AM  6CIT Screen  What Year? 0 points 0 points  What month? 0 points 0 points  What time? 0 points 0 points  Count back from 20 0 points 0 points  Months in reverse 0 points 0 points  Repeat phrase 0 points 0 points  Total Score 0 points 0 points    Immunizations Immunization History  Administered Date(s) Administered   Fluad Quad(high Dose 65+) 10/18/2018, 11/29/2020, 10/12/2022   Influenza Split 10/31/2011   Influenza Whole 12/26/2008   Influenza, High Dose Seasonal PF 12/17/2014, 11/28/2016, 11/13/2017, 11/04/2019, 10/26/2021   Influenza-Unspecified 11/07/2012, 10/28/2013, 11/30/2015   PFIZER Comirnaty(Gray Top)Covid-19 Tri-Sucrose Vaccine 10/26/2021   PFIZER(Purple Top)SARS-COV-2 Vaccination 02/20/2019, 03/12/2019, 11/04/2019, 05/15/2020, 10/29/2020   Pfizer Covid-19 Vaccine Bivalent Booster 69yrs & up 10/29/2020, 06/28/2021, 10/06/2022   Pneumococcal Conjugate-13 01/09/2015   Pneumococcal Polysaccharide-23 02/02/2016   Respiratory Syncytial Virus Vaccine,Recomb Aduvanted(Arexvy) 10/06/2021   Tetanus 10/21/2013   Zoster Recombinant(Shingrix) 07/08/2021, 09/29/2021   Zoster, Live 11/11/2013    TDAP status: Up to date  Flu Vaccine status: Up to date  Pneumococcal vaccine status: Up to date  Covid-19 vaccine status: Completed vaccines  Qualifies for Shingles Vaccine? Yes   Zostavax completed Yes   Shingrix Completed?: Yes  Screening Tests Health Maintenance  Topic  Date Due   COVID-19 Vaccine (9 - 2023-24 season) 12/01/2022   Medicare Annual Wellness (AWV)  11/22/2023   Pneumonia Vaccine 84+ Years old  Completed   INFLUENZA VACCINE  Completed   Zoster Vaccines- Shingrix  Completed   HPV VACCINES  Aged Out   DTaP/Tdap/Td  Discontinued    Health Maintenance  There are no preventive care reminders to display for this patient.   Colorectal cancer screening: No longer required.   Lung Cancer Screening: (Low Dose CT Chest recommended if Age 23-80 years, 20 pack-year currently smoking OR have quit w/in 15years.) does not qualify.   Lung Cancer Screening Referral: no  Additional Screening:  Hepatitis C Screening: does not qualify.  Vision Screening: Recommended annual ophthalmology exams for early detection of glaucoma and other disorders of the eye. Is the patient up to date with their annual eye exam?  Yes  Who is the provider or what is the name of the office in which the patient attends annual eye exams? Maris Berger, MD. If pt is not established with a provider, would they like to be referred to a provider to establish care? No .   Dental Screening: Recommended annual dental  exams for proper oral hygiene  Diabetic Foot Exam: N/A  Community Resource Referral / Chronic Care Management: CRR required this visit?  No   CCM required this visit?  No     Plan:     I have personally reviewed and noted the following in the patient's chart:   Medical and social history Use of alcohol, tobacco or illicit drugs  Current medications and supplements including opioid prescriptions. Patient is not currently taking opioid prescriptions. Functional ability and status Nutritional status Physical activity Advanced directives List of other physicians Hospitalizations, surgeries, and ER visits in previous 12 months Vitals Screenings to include cognitive, depression, and falls Referrals and appointments  In addition, I have reviewed and  discussed with patient certain preventive protocols, quality metrics, and best practice recommendations. A written personalized care plan for preventive services as well as general preventive health recommendations were provided to patient.     Mickeal Needy, LPN   [id number]/29/5621   After Visit Summary: (MyChart) Due to this being a telephonic visit, the after visit summary with patients personalized plan was offered to patient via MyChart   Nurse Notes: Normal cognitive status assessed by direct observation via telephone conversation by this Nurse Health Advisor. No abnormalities found.

## 2022-11-22 NOTE — Patient Instructions (Signed)
Miguel Joseph , Thank you for taking time to come for your Medicare Wellness Visit. I appreciate your ongoing commitment to your health goals. Please review the following plan we discussed and let me know if I can assist you in the future.   Referrals/Orders/Follow-Ups/Clinician Recommendations: No  This is a list of the screening recommended for you and due dates:  Health Maintenance  Topic Date Due   COVID-19 Vaccine (9 - 2023-24 season) 12/01/2022   Medicare Annual Wellness Visit  11/22/2023   Pneumonia Vaccine  Completed   Flu Shot  Completed   Zoster (Shingles) Vaccine  Completed   HPV Vaccine  Aged Out   DTaP/Tdap/Td vaccine  Discontinued    Advanced directives: (In Chart) A copy of your advanced directives are scanned into your chart should your provider ever need it.  Next Medicare Annual Wellness Visit scheduled for next year: Yes

## 2022-11-28 NOTE — Progress Notes (Signed)
Depression screening completed 11/22/2022 during this televisit.  Ashima Shrake N. Ahaan Zobrist, LPN. [facility] AWV Team Direct Dial: [phone]

## 2022-11-30 DIAGNOSIS — M48062 Spinal stenosis, lumbar region with neurogenic claudication: Secondary | ICD-10-CM | POA: Diagnosis not present

## 2022-11-30 DIAGNOSIS — M545 Low back pain, unspecified: Secondary | ICD-10-CM | POA: Diagnosis not present

## 2022-12-05 DIAGNOSIS — M48062 Spinal stenosis, lumbar region with neurogenic claudication: Secondary | ICD-10-CM | POA: Diagnosis not present

## 2022-12-05 DIAGNOSIS — M545 Low back pain, unspecified: Secondary | ICD-10-CM | POA: Diagnosis not present

## 2022-12-19 DIAGNOSIS — M545 Low back pain, unspecified: Secondary | ICD-10-CM | POA: Diagnosis not present

## 2022-12-19 DIAGNOSIS — M48062 Spinal stenosis, lumbar region with neurogenic claudication: Secondary | ICD-10-CM | POA: Diagnosis not present

## 2023-01-09 DIAGNOSIS — M545 Low back pain, unspecified: Secondary | ICD-10-CM | POA: Diagnosis not present

## 2023-01-09 DIAGNOSIS — M48062 Spinal stenosis, lumbar region with neurogenic claudication: Secondary | ICD-10-CM | POA: Diagnosis not present

## 2023-01-11 DIAGNOSIS — M48062 Spinal stenosis, lumbar region with neurogenic claudication: Secondary | ICD-10-CM | POA: Diagnosis not present

## 2023-01-18 DIAGNOSIS — M48062 Spinal stenosis, lumbar region with neurogenic claudication: Secondary | ICD-10-CM | POA: Diagnosis not present

## 2023-01-18 DIAGNOSIS — M545 Low back pain, unspecified: Secondary | ICD-10-CM | POA: Diagnosis not present

## 2023-02-06 DIAGNOSIS — M545 Low back pain, unspecified: Secondary | ICD-10-CM | POA: Diagnosis not present

## 2023-02-06 DIAGNOSIS — M48062 Spinal stenosis, lumbar region with neurogenic claudication: Secondary | ICD-10-CM | POA: Diagnosis not present

## 2023-02-15 DIAGNOSIS — M545 Low back pain, unspecified: Secondary | ICD-10-CM | POA: Diagnosis not present

## 2023-02-15 DIAGNOSIS — M48062 Spinal stenosis, lumbar region with neurogenic claudication: Secondary | ICD-10-CM | POA: Diagnosis not present

## 2023-03-01 DIAGNOSIS — M545 Low back pain, unspecified: Secondary | ICD-10-CM | POA: Diagnosis not present

## 2023-03-01 DIAGNOSIS — M48062 Spinal stenosis, lumbar region with neurogenic claudication: Secondary | ICD-10-CM | POA: Diagnosis not present

## 2023-03-02 ENCOUNTER — Other Ambulatory Visit: Payer: Self-pay | Admitting: Internal Medicine

## 2023-03-08 DIAGNOSIS — M48062 Spinal stenosis, lumbar region with neurogenic claudication: Secondary | ICD-10-CM | POA: Diagnosis not present

## 2023-03-08 DIAGNOSIS — M545 Low back pain, unspecified: Secondary | ICD-10-CM | POA: Diagnosis not present

## 2023-03-15 DIAGNOSIS — M48062 Spinal stenosis, lumbar region with neurogenic claudication: Secondary | ICD-10-CM | POA: Diagnosis not present

## 2023-03-15 DIAGNOSIS — M545 Low back pain, unspecified: Secondary | ICD-10-CM | POA: Diagnosis not present

## 2023-03-22 ENCOUNTER — Other Ambulatory Visit (HOSPITAL_BASED_OUTPATIENT_CLINIC_OR_DEPARTMENT_OTHER): Payer: Self-pay | Admitting: Internal Medicine

## 2023-03-22 MED ORDER — EZETIMIBE 10 MG PO TABS: 10.0000 mg | ORAL_TABLET | Freq: Every day | ORAL | 0 refills | Status: DC

## 2023-03-22 NOTE — Telephone Encounter (Signed)
Called patient and left detailed message that he has not been seen since 2023 and needs to call the office to make an appointment. I have sent in refill for #30 with no refills.

## 2023-03-23 ENCOUNTER — Ambulatory Visit (HOSPITAL_BASED_OUTPATIENT_CLINIC_OR_DEPARTMENT_OTHER): Payer: Medicare Other | Admitting: Internal Medicine

## 2023-03-23 ENCOUNTER — Encounter (HOSPITAL_BASED_OUTPATIENT_CLINIC_OR_DEPARTMENT_OTHER): Payer: Self-pay | Admitting: Internal Medicine

## 2023-03-23 VITALS — BP 118/60 | HR 74 | Ht 68.0 in | Wt 158.5 lb

## 2023-03-23 DIAGNOSIS — I6523 Occlusion and stenosis of bilateral carotid arteries: Secondary | ICD-10-CM | POA: Diagnosis not present

## 2023-03-23 DIAGNOSIS — E785 Hyperlipidemia, unspecified: Secondary | ICD-10-CM

## 2023-03-23 DIAGNOSIS — I2510 Atherosclerotic heart disease of native coronary artery without angina pectoris: Secondary | ICD-10-CM

## 2023-03-23 DIAGNOSIS — I771 Stricture of artery: Secondary | ICD-10-CM

## 2023-03-23 DIAGNOSIS — I452 Bifascicular block: Secondary | ICD-10-CM

## 2023-03-23 MED ORDER — ROSUVASTATIN CALCIUM 20 MG PO TABS: 20.0000 mg | ORAL_TABLET | Freq: Every day | ORAL | 3 refills | Status: AC

## 2023-03-23 MED ORDER — EZETIMIBE 10 MG PO TABS: 10.0000 mg | ORAL_TABLET | Freq: Every day | ORAL | 3 refills | Status: AC

## 2023-03-23 NOTE — Progress Notes (Signed)
OFFICE NOTE  Chief Complaint:  Follow-up   Primary Care Physician: Pincus Sanes, MD  HPI:  Miguel Joseph is a 83 y.o. male who is being seen today for the evaluation of dizziness, shortness of breath at the request of Burns, Bobette Mo, MD. 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  He reports some longstanding shortness of breath but also recently has had acute on chronic shortness of breath, per tickly walking upstairs.  He has reported several episodes of dizziness.  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  He also had a similar experience when he [place] [employer] [place] to [place] a show.  He has had a couple episodes of dizziness as well which seem to be positional.  His PCP felt that this might be a positional vertigo but further evaluation with cardiology was recommended.  He denies any chest pain.  He was noted to have some coronary artery calcification by CT scan that was performed in May 2021.  He denies any significant early onset heart disease in his family and reported that his father was a professor at [employer].  06/08/2021  Miguel Joseph returns today for follow-up of testing. 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  I do not believe that this would explain his shortness of breath which she says is actually improved somewhat.  The left carotid bruit was actually a left subclavian bruit.  Dopplers demonstrated increased velocity of 437 cm/s in the left subclavian suggestive of severe stenosis.  The left arm systolic blood pressure was 102 mmHg in the right arm systolic pressure was 119 mmHg.  He denies any  left arm claudication symptoms.  To have some mild bilateral carotid artery disease.  Lipids in March of this year showed total cholesterol 169, HDL 51, LDL 99 and triglycerides 91.  He is on 40 mg of pravastatin which she is taken for many years.  His goal LDL should be less than 70.  03/23/2023  Miguel Joseph is seen today for follow-up.  Overall he seems to be doing pretty well.  He had carotid Dopplers in April of last year which showed stable bilateral carotid artery disease and left subclavian artery stenosis.  He also had CT scan to evaluate aortic aneurysm.  This did not show any aneurysm but did show about a 60% stenosis to the left subclavian.  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  Coronary artery calcification was again noted.  His lipids from April 2024 showed good control with total cholesterol 102, HDL 54, triglycerides 51 and LDL 38.  Blood pressure is well-controlled today 118/60.  He denies any chest pain or shortness of breath.  EKG today was personally reviewed shows bifascicular block.  PMHx:  Past Medical History:  Diagnosis Date   Chronic kidney disease    managed by Dr. Sabra Heck. Stage III chronic kidney disease. Stable.   Diverticulosis    E. coli UTI (urinary tract infection) june 2021 and 2014   Rx: Cipro   History of anal fissures yrs ago   Hyperlipidemia  Prostate cancer [facility])    UTI (urinary tract infection)    on amoxicillian    Past Surgical History:  Procedure Laterality Date   APPENDECTOMY  1963   CATARACT EXTRACTION, BILATERAL     COLONOSCOPY  2001 &;2007;2016   internal hemorrhoids; Dr Russella Dar   PROSTATE BIOPSY  06/28/2019   RADIOACTIVE SEED IMPLANT N/A 10/16/2019   Procedure: RADIOACTIVE SEED IMPLANT/BRACHYTHERAPY IMPLANT;  Surgeon: Rene Paci, MD;  Location: [facility];  Service: Urology;  Laterality: N/A;   TONSILLECTOMY AND ADENOIDECTOMY  yrs ago   adenoids removed not sure about  tonsils   WISDOM TOOTH EXTRACTION      FAMHx:  Family History  Problem Relation Age of Onset   Breast cancer Mother    Ovarian cancer Mother    Testicular cancer Father    Breast cancer Sister    Pancreatic cancer Paternal Uncle         X2    Heart block Paternal Aunt        pacer   Colon polyps Son 58       tubular adenoma   Diabetes Neg Hx    Stroke Neg Hx    Heart disease Neg Hx    Hypertension Neg Hx    Prostate cancer Neg Hx    Colon cancer Neg Hx     SOCHx:   reports that he has never smoked. He has never used smokeless tobacco. He reports that he does not currently use alcohol after a past usage of about 4.0 standard drinks of alcohol per week. He reports that he does not use drugs.  ALLERGIES:  Allergies  Allergen Reactions   Doxycycline Nausea Only and Other (See Comments)    Nausea, exhausted    ROS: Pertinent items noted in HPI and remainder of comprehensive ROS otherwise negative.  HOME MEDS: Current Outpatient Medications on File Prior to Visit  Medication Sig Dispense Refill   alfuzosin (UROXATRAL) 10 MG 24 hr tablet Take 10 mg by mouth daily with breakfast.     Cyanocobalamin (VITAMIN B12 PO) Take by mouth.     ezetimibe (ZETIA) 10 MG tablet Take 1 tablet (10 mg total) by mouth daily. Call office at [phone] to make an appointment before additional refills will be given. 30 tablet 0   Multiple Vitamins-Minerals (CENTRUM SILVER) tablet      nifedipine 0.3 % ointment Apply rectally 4 times a day 30 each PRN   omeprazole (PRILOSEC) 40 MG capsule SMARTSIG:1 Capsule(s) By Mouth Morning-Evening     Probiotic Product (PROBIOTIC DAILY PO) Take by mouth.     rosuvastatin (CRESTOR) 20 MG tablet Take 1 tablet (20 mg total) by mouth daily. 90 tablet 3   benzonatate (TESSALON) 100 MG capsule Take 1 capsule (100 mg total) by mouth 2 (two) times daily as needed for cough. 20 capsule 0   No current facility-administered medications on file prior to visit.     LABS/IMAGING: No results found for this or any previous visit (from the past 48 hours). No results found.  LIPID PANEL:    Component Value Date/Time   CHOL 102 06/06/2022 0802   CHOL 155 09/27/2021 0810   CHOL 237 (H) 12/19/2013 1042   TRIG 51.0 06/06/2022 0802   TRIG 179 (H) 12/19/2013 1042   HDL 54.00 06/06/2022 0802   HDL 49 09/27/2021 0810   HDL 40 12/19/2013 1042   CHOLHDL 2 06/06/2022 0802   VLDL 10.2 06/06/2022 0802   LDLCALC 38 06/06/2022  0802   LDLCALC 92 09/27/2021 0810   LDLCALC 161 (H) 12/19/2013 1042   LDLDIRECT 164.8 04/10/2008 1015    WEIGHTS: Wt Readings from Last 3 Encounters:  03/23/23 158 lb 8 oz (71.9 kg)  11/22/22 150 lb (68 kg)  05/24/22 162 lb (73.5 kg)    VITALS: BP 118/60   Pulse 74   Ht 5\' 8"  (1.727 m)   Wt 158 lb 8 oz (71.9 kg)   SpO2 96%   BMI 24.10 kg/m   EXAM: General appearance: alert and no distress Neck: no carotid bruit, no JVD, and thyroid not enlarged, symmetric, no tenderness/mass/nodules Lungs: clear to auscultation bilaterally Heart: regular rate and rhythm, S1, S2 normal, no murmur, click, rub or gallop Abdomen: soft, non-tender; bowel sounds normal; no masses,  no organomegaly Extremities: extremities normal, atraumatic, no cyanosis or edema Pulses: 2+ and symmetric Skin: Skin color, texture, turgor normal. 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   Mr. Danielsen seems to be doing well without chest pain or shortness of breath.  He does have left subclavian artery stenosis which is about 60% on CT angiography however he has no arm claudication.  Blood pressure should only be on the right arm.  He has a stable bifascicular block on EKG.  He has coronary calcification with a target LDL less than 70.  He will have repeat lipids with his PCP next month at his routine physical.  He has repeat carotid Dopplers and subclavian evaluation ordered for April which we will try to schedule today.  I will refill his lipid-lowering medications today.  Plan follow-up with me annually or sooner as necessary.  Chrystie Nose, MD, [facility], FACP  [place]  [facility]  Medical Director of the Advanced Lipid Disorders &  Cardiovascular Risk Reduction Clinic Diplomate of the American Board of Clinical Lipidology Attending Cardiologist  Direct Dial: [phone]  Fax: [phone]  Website:  [email or web address] Benedetta Sundstrom 03/23/2023, 1:23 PM

## 2023-03-23 NOTE — Patient Instructions (Addendum)
Medication Instructions:  Your physician recommends that you continue on your current medications as directed. Please refer to the Current Medication list given to you today.  *If you need a refill on your cardiac medications before your next appointment, please call your pharmacy*  Follow-Up: At [facility], you and your health needs are our priority.  As part of our continuing mission to provide you with exceptional heart care, we have created designated Provider Care Teams.  These Care Teams include your primary Cardiologist (physician) and Advanced Practice Providers (APPs -  Physician Assistants and Nurse Practitioners) who all work together to provide you with the care you need, when you need it.  We recommend signing up for the patient portal called "MyChart".  Sign up information is provided on this After Visit Summary.  MyChart is used to connect with patients for Virtual Visits (Telemedicine).  Patients are able to view lab/test results, encounter notes, upcoming appointments, etc.  Non-urgent messages can be sent to your provider as well.   To learn more about what you can do with MyChart, go to [email or web address].    Your next appointment:   1 year  Provider:   Dr. Rennis Golden

## 2023-04-10 DIAGNOSIS — E785 Hyperlipidemia, unspecified: Secondary | ICD-10-CM | POA: Diagnosis not present

## 2023-04-10 DIAGNOSIS — M47816 Spondylosis without myelopathy or radiculopathy, lumbar region: Secondary | ICD-10-CM | POA: Diagnosis not present

## 2023-04-10 DIAGNOSIS — N1831 Chronic kidney disease, stage 3a: Secondary | ICD-10-CM | POA: Diagnosis not present

## 2023-04-17 DIAGNOSIS — M48062 Spinal stenosis, lumbar region with neurogenic claudication: Secondary | ICD-10-CM | POA: Diagnosis not present

## 2023-04-17 DIAGNOSIS — M545 Low back pain, unspecified: Secondary | ICD-10-CM | POA: Diagnosis not present

## 2023-04-25 ENCOUNTER — Encounter: Payer: Self-pay | Admitting: Internal Medicine

## 2023-04-25 NOTE — Progress Notes (Unsigned)
 Subjective:    Patient ID: Miguel Joseph, male    DOB: 1940/11/09, 83 y.o.   MRN: [id number]     HPI Lowry is here for a physical exam and his chronic medical problems.   A couple of months ago - saw urology - low psa.  Dr Danielle Dess  - can not operate on back -- advised gong to PT - seeing Dr Clyda Hurdle and it has helped some.  Balance is not good - using cane more.     Medications and allergies reviewed with patient and updated if appropriate.  Current Outpatient Medications on File Prior to Visit  Medication Sig Dispense Refill   alfuzosin (UROXATRAL) 10 MG 24 hr tablet Take 10 mg by mouth daily with breakfast.     Cyanocobalamin (VITAMIN B12 PO) Take by mouth.     ezetimibe (ZETIA) 10 MG tablet Take 1 tablet (10 mg total) by mouth daily. Call office at [phone] to make an appointment before additional refills will be given. 90 tablet 3   Multiple Vitamins-Minerals (CENTRUM SILVER) tablet      nifedipine 0.3 % ointment Apply rectally 4 times a day 30 each PRN   omeprazole (PRILOSEC) 40 MG capsule SMARTSIG:1 Capsule(s) By Mouth Morning-Evening     Probiotic Product (PROBIOTIC DAILY PO) Take by mouth.     rosuvastatin (CRESTOR) 20 MG tablet Take 1 tablet (20 mg total) by mouth daily. 90 tablet 3   sildenafil (VIAGRA) 100 MG tablet Take 100 mg by mouth as needed.     No current facility-administered medications on file prior to visit.    Review of Systems  Constitutional:  Negative for fever.  Eyes:  Negative for visual disturbance.  Respiratory:  Negative for cough, shortness of breath and wheezing.   Cardiovascular:  Negative for chest pain, palpitations and leg swelling.  Gastrointestinal:  Positive for anal bleeding (anal fissure - intermittent bleeding, discomfort). Negative for abdominal pain, blood in stool, constipation and diarrhea.       No gerd  Genitourinary:  Negative for dysuria and hematuria.  Musculoskeletal:  Positive for back pain and gait problem  (related to lumbar stenosis). Negative for arthralgias.  Skin:  Negative for rash. Color change: area on scalp - dry, slight raised areas. Neurological:  Negative for light-headedness and headaches.  Psychiatric/Behavioral:  Negative for dysphoric mood. The patient is not nervous/anxious.        Objective:   Vitals:   04/26/23 0750  BP: 112/74  Pulse: 60  Temp: 98 F (36.7 C)  SpO2: 95%   Filed Weights   04/26/23 0750  Weight: 153 lb (69.4 kg)   Body mass index is 23.26 kg/m.  BP Readings from Last 3 Encounters:  04/26/23 112/74  03/23/23 118/60  07/01/22 130/75    Wt Readings from Last 3 Encounters:  04/26/23 153 lb (69.4 kg)  03/23/23 158 lb 8 oz (71.9 kg)  11/22/22 150 lb (68 kg)      Physical Exam Constitutional: He appears well-developed and well-nourished. No distress.  HENT:  Head: Normocephalic and atraumatic.  Right Ear: External ear normal.  Left Ear: External ear normal.  Normal ear canals and TM b/l  Mouth/Throat: Oropharynx is clear and moist. Eyes: Conjunctivae and EOM are normal.  Neck: Neck supple. No tracheal deviation present. No thyromegaly present.  No carotid bruit  Cardiovascular: Normal rate, regular rhythm, normal heart sounds and intact distal pulses.   No murmur heard.  No lower extremity edema. Pulmonary/Chest: Effort normal  and breath sounds normal. No respiratory distress. He has no wheezes. He has no rales.  Abdominal: Soft. He exhibits no distension. There is no tenderness.  Genitourinary: deferred  Lymphadenopathy:   He has no cervical adenopathy.  Skin: Skin is warm and dry. He is not diaphoretic.  Psychiatric: He has a normal mood and affect. His behavior is normal.         Assessment & Plan:   Physical exam: Screening blood work  ordered Exercise   walks 2.2 miles daily  - uses walker, stair master Weight  normal Substance abuse   none   Reviewed recommended immunizations.   Health Maintenance  Topic Date Due    COVID-19 Vaccine (9 - 2024-25 season) 05/11/2023 (Originally 12/01/2022)   Medicare Annual Wellness (AWV)  11/22/2023   Pneumonia Vaccine 51+ Years old  Completed   INFLUENZA VACCINE  Completed   Zoster Vaccines- Shingrix  Completed   HPV VACCINES  Aged Out   DTaP/Tdap/Td  Discontinued     See Problem List for Assessment and Plan of chronic medical problems.

## 2023-04-25 NOTE — Patient Instructions (Addendum)
 Blood work was ordered.       Medications changes include :   None   A referral was ordered for PT.   Return in about 1 year (around 04/25/2024) for Physical Exam.    Health Maintenance, Male Adopting a healthy lifestyle and getting preventive care are important in promoting health and wellness. Ask your health care provider about: The right schedule for you to have regular tests and exams. Things you can do on your own to prevent diseases and keep yourself healthy. What should I know about diet, weight, and exercise? Eat a healthy diet  Eat a diet that includes plenty of vegetables, fruits, low-fat dairy products, and lean protein. Do not eat a lot of foods that are high in solid fats, added sugars, or sodium. Maintain a healthy weight Body mass index (BMI) is a measurement that can be used to identify possible weight problems. It estimates body fat based on height and weight. Your health care provider can help determine your BMI and help you achieve or maintain a healthy weight. Get regular exercise Get regular exercise. This is one of the most important things you can do for your health. Most adults should: Exercise for at least 150 minutes each week. The exercise should increase your heart rate and make you sweat (moderate-intensity exercise). Do strengthening exercises at least twice a week. This is in addition to the moderate-intensity exercise. Spend less time sitting. Even light physical activity can be beneficial. Watch cholesterol and blood lipids Have your blood tested for lipids and cholesterol at 84 years of age, then have this test every 5 years. You may need to have your cholesterol levels checked more often if: Your lipid or cholesterol levels are high. You are older than 84 years of age. You are at high risk for heart disease. What should I know about cancer screening? Many types of cancers can be detected early and may often be prevented. Depending on  your health history and family history, you may need to have cancer screening at various ages. This may include screening for: Colorectal cancer. Prostate cancer. Skin cancer. Lung cancer. What should I know about heart disease, diabetes, and high blood pressure? Blood pressure and heart disease High blood pressure causes heart disease and increases the risk of stroke. This is more likely to develop in people who have high blood pressure readings or are overweight. Talk with your health care provider about your target blood pressure readings. Have your blood pressure checked: Every 3-5 years if you are 84-84 years of age. Every year if you are 84 years old or older. If you are between the ages of 2 and 25 and are a current or former smoker, ask your health care provider if you should have a one-time screening for abdominal aortic aneurysm (AAA). Diabetes Have regular diabetes screenings. This checks your fasting blood sugar level. Have the screening done: Once every three years after age 84 if you are at a normal weight and have a low risk for diabetes. More often and at a younger age if you are overweight or have a high risk for diabetes. What should I know about preventing infection? Hepatitis B If you have a higher risk for hepatitis B, you should be screened for this virus. Talk with your health care provider to find out if you are at risk for hepatitis B infection. Hepatitis C Blood testing is recommended for: Everyone born from [birth year] through 1966. Anyone with known  risk factors for hepatitis C. Sexually transmitted infections (STIs) You should be screened each year for STIs, including gonorrhea and chlamydia, if: You are sexually active and are younger than 84 years of age. You are older than 84 years of age and your health care provider tells you that you are at risk for this type of infection. Your sexual activity has changed since you were last screened, and you are at increased  risk for chlamydia or gonorrhea. Ask your health care provider if you are at risk. Ask your health care provider about whether you are at high risk for HIV. Your health care provider may recommend a prescription medicine to help prevent HIV infection. If you choose to take medicine to prevent HIV, you should first get tested for HIV. You should then be tested every 3 months for as long as you are taking the medicine. Follow these instructions at home: Alcohol use Do not drink alcohol if your health care provider tells you not to drink. If you drink alcohol: Limit how much you have to 0-2 drinks a day. Know how much alcohol is in your drink. In the [place] one drink equals one 12 oz bottle of beer (355 mL), one 5 oz glass of wine (148 mL), or one 1 oz glass of hard liquor (44 mL). Lifestyle Do not use any products that contain nicotine or tobacco. These products include cigarettes, chewing tobacco, and vaping devices, such as e-cigarettes. If you need help quitting, ask your health care provider. Do not use street drugs. Do not share needles. Ask your health care provider for help if you need support or information about quitting drugs. General instructions Schedule regular health, dental, and eye exams. Stay current with your vaccines. Tell your health care provider if: You often feel depressed. You have ever been abused or do not feel safe at home. Summary Adopting a healthy lifestyle and getting preventive care are important in promoting health and wellness. Follow your health care provider's instructions about healthy diet, exercising, and getting tested or screened for diseases. Follow your health care provider's instructions on monitoring your cholesterol and blood pressure. This information is not intended to replace advice given to you by your health care provider. Make sure you discuss any questions you have with your health care provider. Document Revised: 06/15/2020 Document  Reviewed: 06/15/2020 Elsevier Patient Education  2024 [employer].

## 2023-04-26 ENCOUNTER — Ambulatory Visit (INDEPENDENT_AMBULATORY_CARE_PROVIDER_SITE_OTHER): Payer: Medicare Other | Admitting: Internal Medicine

## 2023-04-26 VITALS — BP 112/74 | HR 60 | Temp 98.0°F | Ht 68.0 in | Wt 153.0 lb

## 2023-04-26 DIAGNOSIS — I6523 Occlusion and stenosis of bilateral carotid arteries: Secondary | ICD-10-CM

## 2023-04-26 DIAGNOSIS — R2689 Other abnormalities of gait and mobility: Secondary | ICD-10-CM | POA: Insufficient documentation

## 2023-04-26 DIAGNOSIS — I700 Atherosclerosis of aorta: Secondary | ICD-10-CM

## 2023-04-26 DIAGNOSIS — N1831 Chronic kidney disease, stage 3a: Secondary | ICD-10-CM | POA: Diagnosis not present

## 2023-04-26 DIAGNOSIS — Z0000 Encounter for general adult medical examination without abnormal findings: Secondary | ICD-10-CM

## 2023-04-26 DIAGNOSIS — M4716 Other spondylosis with myelopathy, lumbar region: Secondary | ICD-10-CM | POA: Diagnosis not present

## 2023-04-26 DIAGNOSIS — E7849 Other hyperlipidemia: Secondary | ICD-10-CM

## 2023-04-26 DIAGNOSIS — R7303 Prediabetes: Secondary | ICD-10-CM

## 2023-04-26 LAB — COMPREHENSIVE METABOLIC PANEL
ALT: 30 U/L (ref 0–53)
AST: 29 U/L (ref 0–37)
Albumin: 4.5 g/dL (ref 3.5–5.2)
Alkaline Phosphatase: 45 U/L (ref 39–117)
BUN: 30 mg/dL — ABNORMAL HIGH (ref 6–23)
CO2: 29 meq/L (ref 19–32)
Calcium: 9.4 mg/dL (ref 8.4–10.5)
Chloride: 103 meq/L (ref 96–112)
Creatinine, Ser: 1.36 mg/dL (ref 0.40–1.50)
GFR: 48.17 mL/min — ABNORMAL LOW (ref >60.00)
Glucose, Bld: 100 mg/dL — ABNORMAL HIGH (ref 70–99)
Potassium: 4.4 meq/L (ref 3.5–5.1)
Sodium: 138 meq/L (ref 135–145)
Total Bilirubin: 0.7 mg/dL (ref 0.2–1.2)
Total Protein: 6.5 g/dL (ref 6.0–8.3)

## 2023-04-26 LAB — CBC WITH DIFFERENTIAL/PLATELET
Basophils Absolute: 0 10*3/uL (ref 0.0–0.1)
Basophils Relative: 0.4 % (ref 0.0–3.0)
Eosinophils Absolute: 0.1 10*3/uL (ref 0.0–0.7)
Eosinophils Relative: 2.1 % (ref 0.0–5.0)
HCT: 43.3 % (ref 39.0–52.0)
Hemoglobin: 14.6 g/dL (ref 13.0–17.0)
Lymphocytes Relative: 17.4 % (ref 12.0–46.0)
Lymphs Abs: 1.1 10*3/uL (ref 0.7–4.0)
MCHC: 33.7 g/dL (ref 30.0–36.0)
MCV: 95.7 fl (ref 78.0–100.0)
Monocytes Absolute: 0.6 10*3/uL (ref 0.1–1.0)
Monocytes Relative: 9.3 % (ref 3.0–12.0)
Neutro Abs: 4.3 10*3/uL (ref 1.4–7.7)
Neutrophils Relative %: 70.8 % (ref 43.0–77.0)
Platelets: 198 10*3/uL (ref 150.0–400.0)
RBC: 4.52 Mil/uL (ref 4.22–5.81)
RDW: 14.6 % (ref 11.5–15.5)
WBC: 6.1 10*3/uL (ref 4.0–10.5)

## 2023-04-26 LAB — LIPID PANEL
Cholesterol: 111 mg/dL (ref 0–200)
HDL: 61.3 mg/dL (ref >39.00)
LDL Cholesterol: 40 mg/dL (ref 0–99)
NonHDL: 49.66
Total CHOL/HDL Ratio: 2
Triglycerides: 48 mg/dL (ref 0.0–149.0)
VLDL: 9.6 mg/dL (ref 0.0–40.0)

## 2023-04-26 LAB — TSH: TSH: 3 u[IU]/mL (ref 0.35–5.50)

## 2023-04-26 LAB — HEMOGLOBIN A1C: Hgb A1c MFr Bld: 5.9 % (ref 4.6–6.5)

## 2023-04-26 NOTE — Assessment & Plan Note (Signed)
 Chronic Lab Results  Component Value Date   HGBA1C 5.8 06/06/2022   Check a1c Low sugar / carb diet Stressed regular exercise

## 2023-04-26 NOTE — Assessment & Plan Note (Signed)
 Chronic Has seen Dr Danielle Dess - not a surgical candidate Doing PT which helps some Has poor balance - uses cane Needs additional PT

## 2023-04-26 NOTE — Assessment & Plan Note (Signed)
Chronic Following with Dr. Joelyn Oms once a year Stable CMP, CBC

## 2023-04-26 NOTE — Assessment & Plan Note (Signed)
Chronic Regular exercise and healthy diet encouraged Check lipid panel, CMP Continue pravastatin 40 mg daily, Zetia 10 mg daily

## 2023-04-26 NOTE — Assessment & Plan Note (Signed)
 Chronic Check lipid panel, CMP, TSH Continue Zetia 10 mg daily, pravastatin 40 mg daily Healthy diet, exercise encouraged

## 2023-04-26 NOTE — Assessment & Plan Note (Addendum)
 Chronic Has gotten worse Related to lumbar stenosis, neuropathy - numbness in feet Has done PT -- would benefit from additional PT - will refer Continue using cane  Uses walker to walk for exercise - 2.2 miles a day

## 2023-04-26 NOTE — Assessment & Plan Note (Signed)
 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

## 2023-04-28 ENCOUNTER — Encounter: Payer: Self-pay | Admitting: Internal Medicine

## 2023-05-15 DIAGNOSIS — R2689 Other abnormalities of gait and mobility: Secondary | ICD-10-CM | POA: Diagnosis not present

## 2023-05-15 DIAGNOSIS — M79605 Pain in left leg: Secondary | ICD-10-CM | POA: Diagnosis not present

## 2023-05-15 DIAGNOSIS — M79604 Pain in right leg: Secondary | ICD-10-CM | POA: Diagnosis not present

## 2023-05-24 DIAGNOSIS — R2689 Other abnormalities of gait and mobility: Secondary | ICD-10-CM | POA: Diagnosis not present

## 2023-05-24 DIAGNOSIS — M79604 Pain in right leg: Secondary | ICD-10-CM | POA: Diagnosis not present

## 2023-05-24 DIAGNOSIS — M79605 Pain in left leg: Secondary | ICD-10-CM | POA: Diagnosis not present

## 2023-05-29 ENCOUNTER — Ambulatory Visit (INDEPENDENT_AMBULATORY_CARE_PROVIDER_SITE_OTHER): Payer: Medicare Other

## 2023-05-29 DIAGNOSIS — I771 Stricture of artery: Secondary | ICD-10-CM

## 2023-05-29 DIAGNOSIS — I6523 Occlusion and stenosis of bilateral carotid arteries: Secondary | ICD-10-CM | POA: Diagnosis not present

## 2023-05-31 DIAGNOSIS — M79604 Pain in right leg: Secondary | ICD-10-CM | POA: Diagnosis not present

## 2023-05-31 DIAGNOSIS — R2689 Other abnormalities of gait and mobility: Secondary | ICD-10-CM | POA: Diagnosis not present

## 2023-05-31 DIAGNOSIS — M79605 Pain in left leg: Secondary | ICD-10-CM | POA: Diagnosis not present

## 2023-06-06 DIAGNOSIS — H53002 Unspecified amblyopia, left eye: Secondary | ICD-10-CM | POA: Diagnosis not present

## 2023-06-06 DIAGNOSIS — Z961 Presence of intraocular lens: Secondary | ICD-10-CM | POA: Diagnosis not present

## 2023-06-06 DIAGNOSIS — H52203 Unspecified astigmatism, bilateral: Secondary | ICD-10-CM | POA: Diagnosis not present

## 2023-06-06 DIAGNOSIS — H353131 Nonexudative age-related macular degeneration, bilateral, early dry stage: Secondary | ICD-10-CM | POA: Diagnosis not present

## 2023-06-06 DIAGNOSIS — H5000 Unspecified esotropia: Secondary | ICD-10-CM | POA: Diagnosis not present

## 2023-06-07 DIAGNOSIS — M79605 Pain in left leg: Secondary | ICD-10-CM | POA: Diagnosis not present

## 2023-06-07 DIAGNOSIS — M79604 Pain in right leg: Secondary | ICD-10-CM | POA: Diagnosis not present

## 2023-06-07 DIAGNOSIS — R2689 Other abnormalities of gait and mobility: Secondary | ICD-10-CM | POA: Diagnosis not present

## 2023-06-09 DIAGNOSIS — D044 Carcinoma in situ of skin of scalp and neck: Secondary | ICD-10-CM | POA: Diagnosis not present

## 2023-06-09 DIAGNOSIS — M71342 Other bursal cyst, left hand: Secondary | ICD-10-CM | POA: Diagnosis not present

## 2023-06-09 DIAGNOSIS — L570 Actinic keratosis: Secondary | ICD-10-CM | POA: Diagnosis not present

## 2023-06-09 DIAGNOSIS — L821 Other seborrheic keratosis: Secondary | ICD-10-CM | POA: Diagnosis not present

## 2023-06-09 DIAGNOSIS — D1801 Hemangioma of skin and subcutaneous tissue: Secondary | ICD-10-CM | POA: Diagnosis not present

## 2023-06-14 DIAGNOSIS — M79605 Pain in left leg: Secondary | ICD-10-CM | POA: Diagnosis not present

## 2023-06-14 DIAGNOSIS — R2689 Other abnormalities of gait and mobility: Secondary | ICD-10-CM | POA: Diagnosis not present

## 2023-06-14 DIAGNOSIS — M79604 Pain in right leg: Secondary | ICD-10-CM | POA: Diagnosis not present

## 2023-06-16 ENCOUNTER — Emergency Department (HOSPITAL_BASED_OUTPATIENT_CLINIC_OR_DEPARTMENT_OTHER)
Admission: EM | Admit: 2023-06-16 | Discharge: 2023-06-16 | Disposition: A | Discharge status: Home / Self Care | Attending: Emergency Medicine | Admitting: Emergency Medicine

## 2023-06-16 ENCOUNTER — Emergency Department (HOSPITAL_BASED_OUTPATIENT_CLINIC_OR_DEPARTMENT_OTHER)

## 2023-06-16 ENCOUNTER — Encounter (HOSPITAL_BASED_OUTPATIENT_CLINIC_OR_DEPARTMENT_OTHER): Payer: Self-pay | Admitting: Emergency Medicine

## 2023-06-16 DIAGNOSIS — J069 Acute upper respiratory infection, unspecified: Secondary | ICD-10-CM | POA: Insufficient documentation

## 2023-06-16 DIAGNOSIS — Z8546 Personal history of malignant neoplasm of prostate: Secondary | ICD-10-CM | POA: Insufficient documentation

## 2023-06-16 DIAGNOSIS — N183 Chronic kidney disease, stage 3 unspecified: Secondary | ICD-10-CM | POA: Insufficient documentation

## 2023-06-16 DIAGNOSIS — Z79899 Other long term (current) drug therapy: Secondary | ICD-10-CM | POA: Diagnosis not present

## 2023-06-16 DIAGNOSIS — R059 Cough, unspecified: Secondary | ICD-10-CM | POA: Diagnosis not present

## 2023-06-16 DIAGNOSIS — R9389 Abnormal findings on diagnostic imaging of other specified body structures: Secondary | ICD-10-CM | POA: Diagnosis not present

## 2023-06-16 LAB — RESP PANEL BY RT-PCR (RSV, FLU A&B, COVID)  RVPGX2
Influenza A by PCR: NEGATIVE
Influenza B by PCR: NEGATIVE
Resp Syncytial Virus by PCR: NEGATIVE
SARS Coronavirus 2 by RT PCR: NEGATIVE

## 2023-06-16 MED ORDER — ALBUTEROL SULFATE HFA 108 (90 BASE) MCG/ACT IN AERS
2.0000 | INHALATION_SPRAY | RESPIRATORY_TRACT | Status: DC | PRN
  Administered 2023-06-16: 2 via RESPIRATORY_TRACT
  Filled 2023-06-16: qty 6.7

## 2023-06-16 MED ORDER — GUAIFENESIN-DM 100-10 MG/5ML PO SYRP: 5.0000 mL | ORAL_SOLUTION | Freq: Three times a day (TID) | ORAL | 0 refills | Status: AC | PRN

## 2023-06-16 NOTE — ED Provider Notes (Signed)
 [place] EMERGENCY DEPARTMENT AT [facility] Provider Note   CSN: [id number] Arrival date & time: 06/16/23  0602     History  Chief Complaint  Patient presents with   Cough    Miguel Joseph is a 83 y.o. male.   Cough Patient presents with cough.  Has had for around the last 5 or 6 days.  Gotten worse.  Occasional clear production.  No fevers.  No lightheadedness or dizziness.  Has not felt fevers or chills.  No underlying lung disease.  No definite sick contacts.  Patient's blood pressure does tend to run low.    Past Medical History:  Diagnosis Date   Chronic kidney disease    managed by Dr. Fawn Hooks. Stage III chronic kidney disease. Stable.   Diverticulosis    E. coli UTI (urinary tract infection) june 2021 and 2014   Rx: Cipro    History of anal fissures yrs ago   Hyperlipidemia    Prostate cancer (HCC)    UTI (urinary tract infection)    on amoxicillian    Home Medications Prior to Admission medications   Medication Sig Start Date End Date Taking? Authorizing Provider  guaiFENesin -dextromethorphan (ROBITUSSIN DM) 100-10 MG/5ML syrup Take 5 mLs by mouth 3 (three) times daily as needed for cough. 06/16/23  Yes Mozell Arias, MD  alfuzosin (UROXATRAL) 10 MG 24 hr tablet Take 10 mg by mouth daily with breakfast.    [provider]  Cyanocobalamin (VITAMIN B12 PO) Take by mouth.    [provider]  ezetimibe  (ZETIA ) 10 MG tablet Take 1 tablet (10 mg total) by mouth daily. Call office at [phone] to make an appointment before additional refills will be given. 03/23/23   Hazle Lites, MD  Multiple Vitamins-Minerals (CENTRUM SILVER) tablet  06/03/21   [provider]  nifedipine  0.3 % ointment Apply rectally 4 times a day 07/12/22   Colene Dauphin, MD  omeprazole (PRILOSEC) 40 MG capsule SMARTSIG:1 Capsule(s) By Mouth Morning-Evening 11/10/21   [provider]  Probiotic Product (PROBIOTIC DAILY PO) Take by mouth.     [provider]  rosuvastatin  (CRESTOR ) 20 MG tablet Take 1 tablet (20 mg total) by mouth daily. 03/23/23   Hilty, Aviva Lemmings, MD  sildenafil (VIAGRA) 100 MG tablet Take 100 mg by mouth as needed. 02/20/23   [provider]      Allergies    Doxycycline     Review of Systems   Review of Systems  Respiratory:  Positive for cough.     Physical Exam Updated Vital Signs BP 113/71 (BP Location: Right Arm)   Pulse 79   Temp 98 F (36.7 C) (Oral)   Resp 18   Ht 5\' 8"  (1.727 m)   Wt 66.7 kg   SpO2 93%   BMI 22.35 kg/m  Physical Exam Vitals and nursing note reviewed.  HENT:     Head: Atraumatic.  Cardiovascular:     Rate and Rhythm: Normal rate.  Pulmonary:     Breath sounds: Rhonchi present.     Comments: Harsh breath sounds mildly but diffusely. Abdominal:     Tenderness: There is no abdominal tenderness.  Skin:    General: Skin is warm.  Neurological:     Mental Status: He is alert and oriented to person, place, and time.     ED Results / Procedures / Treatments   Labs (all labs ordered are listed, but only abnormal results are displayed) Labs Reviewed  RESP PANEL BY  RT-PCR (RSV, FLU A&B, COVID)  RVPGX2    EKG None  Radiology DG Chest 1 View Result Date: 06/16/2023 CLINICAL DATA:  Cough for 6 days EXAM: CHEST  1 VIEW COMPARISON:  02/07/2022 FINDINGS: Normal heart size and mediastinal contours given slight rotation. Chronic elevation of the left diaphragm. Retrocardiac aeration is improved. There is no edema, consolidation, effusion, or pneumothorax. IMPRESSION: No active disease. Electronically Signed   By: Ronnette Coke M.D.   On: 06/16/2023 07:05    Procedures Procedures    Medications Ordered in ED Medications  albuterol  (VENTOLIN  HFA) 108 (90 Base) MCG/ACT inhaler 2 puff (has no administration in time range)    ED Course/ Medical Decision Making/ A&P                                 Medical Decision Making Amount and/or Complexity of  Data Reviewed Radiology: ordered.  Risk OTC drugs. Prescription drug management.   Patient with URI symptoms.  Differential diagnosis does include cause such as viral syndrome, pneumonia.  Blood pressure is low but tends to run low.  Not symptomatic.  No lightheadedness or dizziness.  Does not appear septic.  X-ray reassuring with no pneumonia.  Does have some rhonchi but not localizing in the lung fields.  Will give inhaler.  Negative flu COVID and viral testing.  Appears stable for discharge home.  Robitussin DM prescribed.  Has been using guaifenesin  also.  Appears stable for discharge home.       Final Clinical Impression(s) / ED Diagnoses Final diagnoses:  Upper respiratory tract infection, unspecified type    Rx / DC Orders ED Discharge Orders          Ordered    guaiFENesin -dextromethorphan (ROBITUSSIN DM) 100-10 MG/5ML syrup  3 times daily PRN        06/16/23 0727              Mozell Arias, MD 06/16/23 0745

## 2023-06-16 NOTE — ED Notes (Signed)
 MDI with spacer instructions step by step reviewed with RT and pt, pt demonstrated proper use of inhaler and verbalized understanding. Discharge instructions, follow up care, and prescription reviewed and explained, pt verbalized understanding with no further questions on d/c. Pt caox4, ambulatory, NAD on d/c.

## 2023-06-16 NOTE — ED Triage Notes (Signed)
 Pt coming in from home with a cough that started on Sunday and has gotten progressively worse over the week. Pt denies fevers, chills, N/V/D

## 2023-06-20 ENCOUNTER — Ambulatory Visit (INDEPENDENT_AMBULATORY_CARE_PROVIDER_SITE_OTHER): Admitting: Internal Medicine

## 2023-06-20 VITALS — BP 108/66 | HR 82 | Temp 97.8°F | Ht 68.0 in | Wt 152.0 lb

## 2023-06-20 DIAGNOSIS — J22 Unspecified acute lower respiratory infection: Secondary | ICD-10-CM | POA: Diagnosis not present

## 2023-06-20 MED ORDER — AZITHROMYCIN 250 MG PO TABS: ORAL_TABLET | ORAL | 0 refills | Status: AC

## 2023-06-20 NOTE — Assessment & Plan Note (Addendum)
 Acute Symptoms started 10-14 days - cough has gotten worse, deeper Resp panel, cxr negative on 5/9 Concern for LRTI - developing bronchitis Zpak Otc cold meds for symptom relief Call if not improving

## 2023-06-20 NOTE — Progress Notes (Signed)
 Subjective:    Patient ID: Miguel Joseph, male    DOB: [birth year], 83 y.o.   MRN: [id number]      HPI Miguel Joseph is here for  Chief Complaint  Patient presents with   Cough    He is here for an acute visit for cold symptoms.  His symptoms started about 10 days ago.  He went to the [facility] 5/9-respiratory panel for COVID, flu A/B were negative.  Chest x-ray negative.  He was given albuterol  inhaler.  He was discharged home with Robitussin DM cough syrup.  He is experiencing cough that has worsened over the past week.  The cough has gotten deeper.  He is not bringing up any sputum, but his cough sounds wet.    He has tried taking albuterol  inhaler, robitussin cough syrup,otc cold meds     Medications and allergies reviewed with patient and updated if appropriate.  Current Outpatient Medications on File Prior to Visit  Medication Sig Dispense Refill   alfuzosin (UROXATRAL) 10 MG 24 hr tablet Take 10 mg by mouth daily with breakfast.     Cyanocobalamin (VITAMIN B12 PO) Take by mouth.     ezetimibe  (ZETIA ) 10 MG tablet Take 1 tablet (10 mg total) by mouth daily. Call office at [phone] to make an appointment before additional refills will be given. 90 tablet 3   guaiFENesin -dextromethorphan (ROBITUSSIN DM) 100-10 MG/5ML syrup Take 5 mLs by mouth 3 (three) times daily as needed for cough. 118 mL 0   Multiple Vitamins-Minerals (CENTRUM SILVER) tablet      nifedipine  0.3 % ointment Apply rectally 4 times a day 30 each PRN   omeprazole (PRILOSEC) 40 MG capsule SMARTSIG:1 Capsule(s) By Mouth Morning-Evening     Probiotic Product (PROBIOTIC DAILY PO) Take by mouth.     rosuvastatin  (CRESTOR ) 20 MG tablet Take 1 tablet (20 mg total) by mouth daily. 90 tablet 3   sildenafil (VIAGRA) 100 MG tablet Take 100 mg by mouth as needed.     No current facility-administered medications on file prior to visit.    Review of Systems  Constitutional:  Negative for fever.  HENT:   Positive for congestion (mild) and rhinorrhea. Negative for ear pain, sinus pressure and sore throat.   Respiratory:  Positive for cough and wheezing. Negative for chest tightness and shortness of breath.   Neurological:  Negative for light-headedness and headaches.       Objective:   Vitals:   06/20/23 0746  BP: 108/66  Pulse: 82  Temp: 97.8 F (36.6 C)  SpO2: 96%   BP Readings from Last 3 Encounters:  06/20/23 108/66  06/16/23 113/60  04/26/23 112/74   Wt Readings from Last 3 Encounters:  06/20/23 152 lb (68.9 kg)  06/16/23 147 lb (66.7 kg)  04/26/23 153 lb (69.4 kg)   Body mass index is 23.11 kg/m.    Physical Exam Constitutional:      General: He is not in acute distress.    Appearance: Normal appearance. He is not ill-appearing.  HENT:     Head: Normocephalic.     Right Ear: Tympanic membrane, ear canal and external ear normal. There is no impacted cerumen.     Left Ear: Tympanic membrane, ear canal and external ear normal. There is no impacted cerumen.     Mouth/Throat:     Mouth: Mucous membranes are moist.     Pharynx: No oropharyngeal exudate or posterior oropharyngeal erythema.  Eyes:     Conjunctiva/sclera:  Conjunctivae normal.  Cardiovascular:     Rate and Rhythm: Normal rate and regular rhythm.  Pulmonary:     Effort: Pulmonary effort is normal. No respiratory distress.     Breath sounds: Normal breath sounds. No wheezing or rales.  Musculoskeletal:     Cervical back: Neck supple. No tenderness.  Lymphadenopathy:     Cervical: No cervical adenopathy.  Skin:    General: Skin is warm and dry.     Findings: No rash.  Neurological:     Mental Status: He is alert.            Assessment & Plan:    See Problem List for Assessment and Plan of chronic medical problems.

## 2023-06-20 NOTE — Patient Instructions (Addendum)
        Medications changes include : zpak.  Continue otc cold medications for symptom relief        Return if symptoms worsen or fail to improve.

## 2023-06-21 DIAGNOSIS — M79605 Pain in left leg: Secondary | ICD-10-CM | POA: Diagnosis not present

## 2023-06-21 DIAGNOSIS — R2689 Other abnormalities of gait and mobility: Secondary | ICD-10-CM | POA: Diagnosis not present

## 2023-06-21 DIAGNOSIS — M79604 Pain in right leg: Secondary | ICD-10-CM | POA: Diagnosis not present

## 2023-06-28 DIAGNOSIS — M79604 Pain in right leg: Secondary | ICD-10-CM | POA: Diagnosis not present

## 2023-06-28 DIAGNOSIS — M79605 Pain in left leg: Secondary | ICD-10-CM | POA: Diagnosis not present

## 2023-06-28 DIAGNOSIS — R2689 Other abnormalities of gait and mobility: Secondary | ICD-10-CM | POA: Diagnosis not present

## 2023-07-05 DIAGNOSIS — M79604 Pain in right leg: Secondary | ICD-10-CM | POA: Diagnosis not present

## 2023-07-05 DIAGNOSIS — R2689 Other abnormalities of gait and mobility: Secondary | ICD-10-CM | POA: Diagnosis not present

## 2023-07-05 DIAGNOSIS — M79605 Pain in left leg: Secondary | ICD-10-CM | POA: Diagnosis not present

## 2023-07-06 ENCOUNTER — Encounter: Payer: Self-pay | Admitting: Internal Medicine

## 2023-07-17 DIAGNOSIS — R2689 Other abnormalities of gait and mobility: Secondary | ICD-10-CM | POA: Diagnosis not present

## 2023-07-17 DIAGNOSIS — M79605 Pain in left leg: Secondary | ICD-10-CM | POA: Diagnosis not present

## 2023-07-17 DIAGNOSIS — M79604 Pain in right leg: Secondary | ICD-10-CM | POA: Diagnosis not present

## 2023-07-19 DIAGNOSIS — M79604 Pain in right leg: Secondary | ICD-10-CM | POA: Diagnosis not present

## 2023-07-19 DIAGNOSIS — M79605 Pain in left leg: Secondary | ICD-10-CM | POA: Diagnosis not present

## 2023-07-19 DIAGNOSIS — R2689 Other abnormalities of gait and mobility: Secondary | ICD-10-CM | POA: Diagnosis not present

## 2023-07-31 DIAGNOSIS — M79645 Pain in left finger(s): Secondary | ICD-10-CM | POA: Diagnosis not present

## 2023-07-31 DIAGNOSIS — M79604 Pain in right leg: Secondary | ICD-10-CM | POA: Diagnosis not present

## 2023-07-31 DIAGNOSIS — R2689 Other abnormalities of gait and mobility: Secondary | ICD-10-CM | POA: Diagnosis not present

## 2023-07-31 DIAGNOSIS — M79605 Pain in left leg: Secondary | ICD-10-CM | POA: Diagnosis not present

## 2023-08-14 DIAGNOSIS — M79605 Pain in left leg: Secondary | ICD-10-CM | POA: Diagnosis not present

## 2023-08-14 DIAGNOSIS — M79604 Pain in right leg: Secondary | ICD-10-CM | POA: Diagnosis not present

## 2023-08-14 DIAGNOSIS — M67442 Ganglion, left hand: Secondary | ICD-10-CM | POA: Diagnosis not present

## 2023-08-14 DIAGNOSIS — R2689 Other abnormalities of gait and mobility: Secondary | ICD-10-CM | POA: Diagnosis not present

## 2023-08-28 DIAGNOSIS — R2689 Other abnormalities of gait and mobility: Secondary | ICD-10-CM | POA: Diagnosis not present

## 2023-08-28 DIAGNOSIS — M79605 Pain in left leg: Secondary | ICD-10-CM | POA: Diagnosis not present

## 2023-08-28 DIAGNOSIS — M79604 Pain in right leg: Secondary | ICD-10-CM | POA: Diagnosis not present

## 2023-09-04 DIAGNOSIS — M67442 Ganglion, left hand: Secondary | ICD-10-CM | POA: Diagnosis not present

## 2023-09-04 DIAGNOSIS — Z4789 Encounter for other orthopedic aftercare: Secondary | ICD-10-CM | POA: Diagnosis not present

## 2023-09-11 DIAGNOSIS — M79604 Pain in right leg: Secondary | ICD-10-CM | POA: Diagnosis not present

## 2023-09-11 DIAGNOSIS — M79605 Pain in left leg: Secondary | ICD-10-CM | POA: Diagnosis not present

## 2023-09-11 DIAGNOSIS — R2689 Other abnormalities of gait and mobility: Secondary | ICD-10-CM | POA: Diagnosis not present

## 2023-09-18 ENCOUNTER — Encounter: Payer: Self-pay | Admitting: Internal Medicine

## 2023-09-18 DIAGNOSIS — G629 Polyneuropathy, unspecified: Secondary | ICD-10-CM

## 2023-09-18 DIAGNOSIS — M5441 Lumbago with sciatica, right side: Secondary | ICD-10-CM

## 2023-10-02 DIAGNOSIS — S51812A Laceration without foreign body of left forearm, initial encounter: Secondary | ICD-10-CM | POA: Diagnosis not present

## 2023-10-02 DIAGNOSIS — M67442 Ganglion, left hand: Secondary | ICD-10-CM | POA: Diagnosis not present

## 2023-10-17 ENCOUNTER — Other Ambulatory Visit: Payer: Self-pay

## 2023-10-17 ENCOUNTER — Encounter: Payer: Self-pay | Admitting: Internal Medicine

## 2023-10-17 MED ORDER — COVID-19 MRNA VAC-TRIS(PFIZER) 30 MCG/0.3ML IM SUSY: 0.3000 mL | PREFILLED_SYRINGE | Freq: Once | INTRAMUSCULAR | 0 refills | Status: AC

## 2023-10-20 DIAGNOSIS — Z23 Encounter for immunization: Secondary | ICD-10-CM | POA: Diagnosis not present

## 2023-10-20 DIAGNOSIS — L570 Actinic keratosis: Secondary | ICD-10-CM | POA: Diagnosis not present

## 2023-10-20 DIAGNOSIS — D044 Carcinoma in situ of skin of scalp and neck: Secondary | ICD-10-CM | POA: Diagnosis not present

## 2023-10-30 ENCOUNTER — Encounter: Payer: Self-pay | Admitting: Internal Medicine

## 2023-11-10 DIAGNOSIS — M48062 Spinal stenosis, lumbar region with neurogenic claudication: Secondary | ICD-10-CM | POA: Diagnosis not present

## 2023-11-23 ENCOUNTER — Ambulatory Visit: Payer: Medicare Other

## 2023-11-23 VITALS — Ht 68.0 in | Wt 152.0 lb

## 2023-11-23 DIAGNOSIS — Z0000 Encounter for general adult medical examination without abnormal findings: Secondary | ICD-10-CM

## 2023-11-23 NOTE — Patient Instructions (Signed)
 Miguel Joseph,  Thank you for taking the time for your Medicare Wellness Visit. I appreciate your continued commitment to your health goals. Please review the care plan we discussed, and feel free to reach out if I can assist you further.  Medicare recommends these wellness visits once per year to help you and your care team stay ahead of potential health issues. These visits are designed to focus on prevention, allowing your provider to concentrate on managing your acute and chronic conditions during your regular appointments.  Please note that Annual Wellness Visits do not include a physical exam. Some assessments may be limited, especially if the visit was conducted virtually. If needed, we may recommend a separate in-person follow-up with your provider.  Ongoing Care Seeing your primary care provider every 3 to 6 months helps us  monitor your health and provide consistent, personalized care. Last office visit on 06/20/2023.  Keep up the good work.  Referrals If a referral was made during today's visit and you haven't received any updates within two weeks, please contact the referred provider directly to check on the status.  Recommended Screenings:  Health Maintenance  Topic Date Due   Medicare Annual Wellness Visit  11/22/2023   COVID-19 Vaccine (11 - Pfizer risk 2025-26 season) 04/18/2024   Pneumococcal Vaccine for age over 4  Completed   Flu Shot  Completed   Zoster (Shingles) Vaccine  Completed   Meningitis B Vaccine  Aged Out   DTaP/Tdap/Td vaccine  Discontinued       11/23/2023    8:58 AM  Advanced Directives  Does Patient Have a Medical Advance Directive? Yes  Type of Estate agent of [place];Living will  Does patient want to make changes to medical advance directive? No - Patient declined  Copy of Healthcare Power of Attorney in Chart? Yes - validated most recent copy scanned in chart (See row information)   Advance Care Planning is important because  it: Ensures you receive medical care that aligns with your values, goals, and preferences. Provides guidance to your family and loved ones, reducing the emotional burden of decision-making during critical moments.  Vision: Annual vision screenings are recommended for early detection of glaucoma, cataracts, and diabetic retinopathy. These exams can also reveal signs of chronic conditions such as diabetes and high blood pressure.  Dental: Annual dental screenings help detect early signs of oral cancer, gum disease, and other conditions linked to overall health, including heart disease and diabetes.  Please see the attached documents for additional preventive care recommendations.

## 2023-11-23 NOTE — Progress Notes (Signed)
 Subjective:   Miguel Joseph is a 83 y.o. who presents for a Medicare Wellness preventive visit.  As a reminder, Annual Wellness Visits don't include a physical exam, and some assessments may be limited, especially if this visit is performed virtually. We may recommend an in-person follow-up visit with your provider if needed.  Visit Complete: Virtual I connected with  Miguel Joseph on 11/23/23 by a audio enabled telemedicine application and verified that I am speaking with the correct person using two identifiers.  Patient Location: Home  Provider Location: Office/Clinic  I discussed the limitations of evaluation and management by telemedicine. The patient expressed understanding and agreed to proceed.  Vital Signs: Because this visit was a virtual/telehealth visit, some criteria may be missing or patient reported. Any vitals not documented were not able to be obtained and vitals that have been documented are patient reported.  VideoDeclined- This patient declined Librarian, academic. Therefore the visit was completed with audio only.  Persons Participating in Visit: Patient.  AWV Questionnaire: Yes: Patient Medicare AWV questionnaire was completed by the patient on 11/20/2023; I have confirmed that all information answered by patient is correct and no changes since this date.  Cardiac Risk Factors include: advanced age (>81men, >14 women);dyslipidemia;Other (see comment);male gender, Risk factor comments: CKD     Objective:    Today's Vitals   11/23/23 0851  Weight: 152 lb (68.9 kg)  Height: 5' 8 (1.727 m)   Body mass index is 23.11 kg/m.     11/23/2023    8:58 AM 06/16/2023    6:16 AM 11/22/2022    9:22 AM 02/07/2022   12:42 PM 02/04/2022    8:48 AM 11/22/2021   10:17 AM 11/18/2020    2:51 PM  Advanced Directives  Does Patient Have a Medical Advance Directive? Yes No Yes Yes Yes Yes Yes  Type of Estate agent of  [place];Living will  Healthcare Power of [place];Living will Healthcare Power of [place];Living will  Healthcare Power of [place];Living will;Out of facility DNR (pink MOST or yellow form)   Does patient want to make changes to medical advance directive? No - Patient declined  No - Patient declined  No - Patient declined No - Patient declined No - Patient declined  Copy of Healthcare Power of Attorney in Chart? Yes - validated most recent copy scanned in chart (See row information)  Yes - validated most recent copy scanned in chart (See row information)   Yes - validated most recent copy scanned in chart (See row information)   Would patient like information on creating a medical advance directive?      No - Patient declined     Current Medications (verified) Outpatient Encounter Medications as of 11/23/2023  Medication Sig   alfuzosin (UROXATRAL) 10 MG 24 hr tablet Take 10 mg by mouth daily with breakfast.   azithromycin  (ZITHROMAX ) 250 MG tablet Take two tabs the first day and then one tab daily for four days   Cyanocobalamin (VITAMIN B12 PO) Take by mouth.   ezetimibe  (ZETIA ) 10 MG tablet Take 1 tablet (10 mg total) by mouth daily. Call office at [phone] to make an appointment before additional refills will be given.   guaiFENesin -dextromethorphan (ROBITUSSIN DM) 100-10 MG/5ML syrup Take 5 mLs by mouth 3 (three) times daily as needed for cough.   Multiple Vitamins-Minerals (CENTRUM SILVER) tablet    nifedipine  0.3 % ointment Apply rectally 4 times a day   omeprazole (PRILOSEC) 40 MG  capsule SMARTSIG:1 Capsule(s) By Mouth Morning-Evening   Probiotic Product (PROBIOTIC DAILY PO) Take by mouth.   rosuvastatin  (CRESTOR ) 20 MG tablet Take 1 tablet (20 mg total) by mouth daily.   sildenafil (VIAGRA) 100 MG tablet Take 100 mg by mouth as needed.   No facility-administered encounter medications on file as of 11/23/2023.    Allergies (verified) Doxycycline    History: Past Medical  History:  Diagnosis Date   Chronic kidney disease    managed by Dr. Bernardino Gasman. Stage III chronic kidney disease. Stable.   Diverticulosis    E. coli UTI (urinary tract infection) june 2021 and 2014   Rx: Cipro    History of anal fissures yrs ago   Hyperlipidemia    Prostate cancer [facility])    UTI (urinary tract infection)    on amoxicillian   Past Surgical History:  Procedure Laterality Date   APPENDECTOMY  1963   CATARACT EXTRACTION, BILATERAL     COLONOSCOPY  2001 &;2007;2016   internal hemorrhoids; Dr Aneita   PROSTATE BIOPSY  06/28/2019   RADIOACTIVE SEED IMPLANT N/A 10/16/2019   Procedure: RADIOACTIVE SEED IMPLANT/BRACHYTHERAPY IMPLANT;  Surgeon: Devere Lonni Righter, MD;  Location: [facility];  Service: Urology;  Laterality: N/A;   TONSILLECTOMY AND ADENOIDECTOMY  yrs ago   adenoids removed not sure about tonsils   WISDOM TOOTH EXTRACTION     Family History  Problem Relation Age of Onset   Breast cancer Mother    Ovarian cancer Mother    Testicular cancer Father    Breast cancer Sister    Pancreatic cancer Paternal Uncle         X2    Heart block Paternal Aunt        pacer   Colon polyps Son 17       tubular adenoma   Diabetes Neg Hx    Stroke Neg Hx    Heart disease Neg Hx    Hypertension Neg Hx    Prostate cancer Neg Hx    Colon cancer Neg Hx    Social History   Socioeconomic History   Marital status: Married    Spouse name: Ginnie   Number of children: 3   Years of education: Not on file   Highest education level: Professional school degree (e.g., MD, DDS, DVM, JD)  Occupational History   Occupation: retired Pensions consultant  Tobacco Use   Smoking status: Never   Smokeless tobacco: Never  Vaping Use   Vaping status: Never Used  Substance and Sexual Activity   Alcohol use: Not Currently    Alcohol/week: 4.0 standard drinks of alcohol    Types: 4 Glasses of wine per week    Comment: occ wine   Drug use: Never   Sexual activity: Yes   Other Topics Concern   Not on file  Social History Narrative   ** Merged History Encounter ** He is retired Pensions consultant in 2010.   He lives with wife./2025  He has three grown children.   Social Drivers of Corporate investment banker Strain: Low Risk  (11/20/2023)   Overall Financial Resource Strain (CARDIA)    Difficulty of Paying Living Expenses: Not hard at all  Food Insecurity: No Food Insecurity (11/20/2023)   Hunger Vital Sign    Worried About Running Out of Food in the Last Year: Never true    Ran Out of Food in the Last Year: Never true  Transportation Needs: Unknown (11/20/2023)   PRAPARE - Transportation  Lack of Transportation (Medical): Not on file    Lack of Transportation (Non-Medical): No  Physical Activity: Sufficiently Active (11/20/2023)   Exercise Vital Sign    Days of Exercise per Week: 7 days    Minutes of Exercise per Session: 50 min  Stress: No Stress Concern Present (11/20/2023)   [employer] of Occupational Health - Occupational Stress Questionnaire    Feeling of Stress: Not at all  Social Connections: Moderately Integrated (11/20/2023)   Social Connection and Isolation Panel    Frequency of Communication with Friends and Family: More than three times a week    Frequency of Social Gatherings with Friends and Family: More than three times a week    Attends Religious Services: More than 4 times per year    Active Member of [employer] or Organizations: No    Attends Engineer, structural: Not on file    Marital Status: Married    Tobacco Counseling Counseling given: Not Answered    Clinical Intake:  Pre-visit preparation completed: Yes  Pain : No/denies pain     BMI - recorded: 23.11 Nutritional Status: BMI of 19-24  Normal Nutritional Risks: None Diabetes: No  Lab Results  Component Value Date   HGBA1C 5.9 04/26/2023   HGBA1C 5.8 06/06/2022   HGBA1C 6.0 04/22/2021     How often do you need to have someone help you when you  read instructions, pamphlets, or other written materials from your doctor or pharmacy?: 1 - Never  Interpreter Needed?: No  Information entered by :: Javarion Douty, RMA   Activities of Daily Living     11/23/2023    8:56 AM  In your present state of health, do you have any difficulty performing the following activities:  Hearing? 1  Comment wears hearing aides  Vision? 0  Difficulty concentrating or making decisions? 0  Walking or climbing stairs? 0  Dressing or bathing? 0  Doing errands, shopping? 0  Preparing Food and eating ? N  Using the Toilet? N  In the past six months, have you accidently leaked urine? N  Do you have problems with loss of bowel control? N  Managing your Medications? N  Managing your Finances? N  Housekeeping or managing your Housekeeping? N    Patient Care Team: Geofm Glade PARAS, MD as PCP - General (Internal Medicine) Colon Shove, MD as Consulting Physician (Neurosurgery) Cary Doffing, MD as Consulting Physician (Dermatology) Devere Lonni Righter, MD as Consulting Physician (Urology) Marlee Bernardino NOVAK, MD as Attending Physician (Nephrology) Leslee Reusing, MD as Consulting Physician (Ophthalmology)  I have updated your Care Teams any recent Medical Services you may have received from other providers in the past year.     Assessment:   This is a routine wellness examination for Miguel Joseph.  Hearing/Vision screen Hearing Screening - Comments:: wears hearing aides Vision Screening - Comments:: Wears eyeglasses/ Dr. Leslee   Goals Addressed   None    Depression Screen     11/23/2023    9:03 AM 11/28/2022    4:39 PM 07/01/2022   10:12 AM 05/24/2022    4:14 PM 04/25/2022    8:00 AM 02/14/2022    1:12 PM 11/22/2021   10:15 AM  PHQ 2/9 Scores  PHQ - 2 Score 0 0 0 0 0 0 0  PHQ- 9 Score 0  0 0 0    Exception Documentation  Other- indicate reason in comment box         Fall Risk  11/23/2023    8:59 AM 11/22/2022    9:24 AM 11/21/2022     7:53 AM 07/01/2022   10:12 AM 05/24/2022    4:14 PM  Fall Risk   Falls in the past year? 0 0 0 0 0  Number falls in past yr: 0 0 0 0 0  Injury with Fall? 0 0 0 0 0  Risk for fall due to :  No Fall Risks  No Fall Risks No Fall Risks  Follow up Falls evaluation completed;Falls prevention discussed Falls prevention discussed  Falls evaluation completed Falls evaluation completed    MEDICARE RISK AT HOME:  Medicare Risk at Home Any stairs in or around the home?: Yes If so, are there any without handrails?: No Home free of loose throw rugs in walkways, pet beds, electrical cords, etc?: Yes Adequate lighting in your home to reduce risk of falls?: Yes Life alert?: No Use of a cane, walker or w/c?: Yes (cane and walker) Grab bars in the bathroom?: Yes Shower chair or bench in shower?: No Elevated toilet seat or a handicapped toilet?: No  TIMED UP AND GO:  Was the test performed?  No  Cognitive Function: 6CIT completed    11/22/2022    9:25 AM  MMSE - Mini Mental State Exam  Not completed: Unable to complete        11/23/2023    9:00 AM 11/22/2022    9:25 AM 11/22/2021   10:21 AM  6CIT Screen  What Year? 0 points 0 points 0 points  What month? 0 points 0 points 0 points  What time? 0 points 0 points 0 points  Count back from 20 0 points 0 points 0 points  Months in reverse 0 points 0 points 0 points  Repeat phrase 0 points 0 points 0 points  Total Score 0 points 0 points 0 points    Immunizations Immunization History  Administered Date(s) Administered   Fluad Quad(high Dose 65+) 10/18/2018, 11/29/2020, 10/12/2022, 10/30/2023   INFLUENZA, HIGH DOSE SEASONAL PF 12/17/2014, 11/28/2016, 11/13/2017, 11/04/2019, 10/26/2021   Influenza Split 10/31/2011   Influenza Whole 12/26/2008   Influenza-Unspecified 11/07/2012, 10/28/2013, 11/30/2015   PFIZER Comirnaty(Gray Top)Covid-19 Tri-Sucrose Vaccine 10/26/2021   PFIZER(Purple Top)SARS-COV-2 Vaccination 02/20/2019, 03/12/2019,  11/04/2019, 05/15/2020, 10/29/2020   Pfizer Covid-19 Vaccine Bivalent Booster 39yrs & up 10/29/2020, 06/28/2021, 10/06/2022   Pfizer(Comirnaty)Fall Seasonal Vaccine 12 years and older 07/05/2023, 10/20/2023   Pneumococcal Conjugate-13 01/09/2015   Pneumococcal Polysaccharide-23 02/02/2016   Respiratory Syncytial Virus Vaccine,Recomb Aduvanted(Arexvy) 10/06/2021   Tetanus 10/21/2013   Zoster Recombinant(Shingrix) 07/08/2021, 09/29/2021   Zoster, Live 11/11/2013    Screening Tests Health Maintenance  Topic Date Due   Medicare Annual Wellness (AWV)  11/22/2023   COVID-19 Vaccine (11 - Pfizer risk 2025-26 season) 04/18/2024   Pneumococcal Vaccine: 50+ Years  Completed   Influenza Vaccine  Completed   Zoster Vaccines- Shingrix  Completed   Meningococcal B Vaccine  Aged Out   DTaP/Tdap/Td  Discontinued    Health Maintenance Items Addressed: See Nurse Notes at the end of this note  Additional Screening:  Vision Screening: Recommended annual ophthalmology exams for early detection of glaucoma and other disorders of the eye. Is the patient up to date with their annual eye exam?  Yes  Who is the provider or what is the name of the office in which the patient attends annual eye exams? Dr. Patterson pt-up to date  Dental Screening: Recommended annual dental exams for proper oral hygiene  Community Resource Referral /  Chronic Care Management: CRR required this visit?  No   CCM required this visit?  No   Plan:    I have personally reviewed and noted the following in the patient's chart:   Medical and social history Use of alcohol, tobacco or illicit drugs  Current medications and supplements including opioid prescriptions. Patient is not currently taking opioid prescriptions. Functional ability and status Nutritional status Physical activity Advanced directives List of other physicians Hospitalizations, surgeries, and ER visits in previous 12 months Vitals Screenings to  include cognitive, depression, and falls Referrals and appointments  In addition, I have reviewed and discussed with patient certain preventive protocols, quality metrics, and best practice recommendations. A written personalized care plan for preventive services as well as general preventive health recommendations were provided to patient.   Jahi Roza L Marquitta Persichetti, CMA   11/23/2023   After Visit Summary: (MyChart) Due to this being a telephonic visit, the after visit summary with patients personalized plan was offered to patient via MyChart   Notes: Nothing significant to report at this time.

## 2023-12-06 ENCOUNTER — Other Ambulatory Visit: Payer: Self-pay | Admitting: Internal Medicine

## 2023-12-06 MED ORDER — NIFEDIPINE 0.3 % OINTMENT: TOPICAL_OINTMENT | Freq: Two times a day (BID) | CUTANEOUS | 99 refills | Status: AC | PRN

## 2023-12-06 NOTE — Telephone Encounter (Unsigned)
 Copied from CRM [id number]. Topic: Clinical - Medication Refill >> Dec 06, 2023 11:03 AM Macario HERO wrote: Medication: nifedipine  0.3 % ointment [[id number]]  Has the patient contacted their pharmacy? Yes (Agent: If no, request that the patient contact the pharmacy for the refill. If patient does not wish to contact the pharmacy document the reason why and proceed with request.) (Agent: If yes, when and what did the pharmacy advise?)  This is the patient's preferred pharmacy:    [facility] - [place], [place] - [address] [place] [address] Phone: [phone] Fax: [phone]   Is this the correct pharmacy for this prescription? Yes If no, delete pharmacy and type the correct one.   Has the prescription been filled recently? Yes  Is the patient out of the medication? Yes  Has the patient been seen for an appointment in the last year OR does the patient have an upcoming appointment? Yes  Can we respond through MyChart? No  Agent: Please be advised that Rx refills may take up to 3 business days. We ask that you follow-up with your pharmacy.

## 2024-11-25 ENCOUNTER — Ambulatory Visit
